# Patient Record
Sex: Female | Born: 1965 | Race: Black or African American | Hispanic: No | Marital: Married | Smoking: Never smoker
Health system: Southern US, Community
[De-identification: ages and names within clinical notes are randomized; demographics above are authoritative.]

## PROBLEM LIST (undated history)

## (undated) DIAGNOSIS — G4730 Sleep apnea, unspecified: Secondary | ICD-10-CM

## (undated) DIAGNOSIS — D649 Anemia, unspecified: Secondary | ICD-10-CM

## (undated) DIAGNOSIS — R9431 Abnormal electrocardiogram [ECG] [EKG]: Secondary | ICD-10-CM

## (undated) DIAGNOSIS — K219 Gastro-esophageal reflux disease without esophagitis: Secondary | ICD-10-CM

## (undated) DIAGNOSIS — R072 Precordial pain: Secondary | ICD-10-CM

## (undated) DIAGNOSIS — E785 Hyperlipidemia, unspecified: Secondary | ICD-10-CM

## (undated) DIAGNOSIS — I10 Essential (primary) hypertension: Secondary | ICD-10-CM

## (undated) DIAGNOSIS — R202 Paresthesia of skin: Secondary | ICD-10-CM

## (undated) DIAGNOSIS — J45909 Unspecified asthma, uncomplicated: Secondary | ICD-10-CM

## (undated) DIAGNOSIS — F419 Anxiety disorder, unspecified: Secondary | ICD-10-CM

## (undated) HISTORY — DX: Anemia, unspecified: D64.9

## (undated) HISTORY — DX: Paresthesia of skin: R20.2

## (undated) HISTORY — DX: Unspecified asthma, uncomplicated: J45.909

## (undated) HISTORY — DX: Essential (primary) hypertension: I10

## (undated) HISTORY — DX: Sleep apnea, unspecified: G47.30

## (undated) HISTORY — DX: Hyperlipidemia, unspecified: E78.5

## (undated) HISTORY — DX: Abnormal electrocardiogram (ECG) (EKG): R94.31

## (undated) HISTORY — DX: Precordial pain: R07.2

## (undated) HISTORY — PX: ABDOMINAL HYSTERECTOMY: SHX81

## (undated) HISTORY — PX: CHOLECYSTECTOMY: SHX55

## (undated) HISTORY — DX: Gastro-esophageal reflux disease without esophagitis: K21.9

## (undated) HISTORY — DX: Anxiety disorder, unspecified: F41.9

---

## 2008-02-06 ENCOUNTER — Emergency Department (HOSPITAL_COMMUNITY): Admission: EM | Admit: 2008-02-06 | Discharge: 2008-02-07 | Payer: Self-pay | Admitting: Emergency Medicine

## 2008-02-07 IMAGING — CR DG CHEST 2V
2 series · 2 of 2 positions shown · non-contrast
Comparison: None

CLINICAL DATA: Chest pain

CHEST - 2 VIEW

[w chest pa]
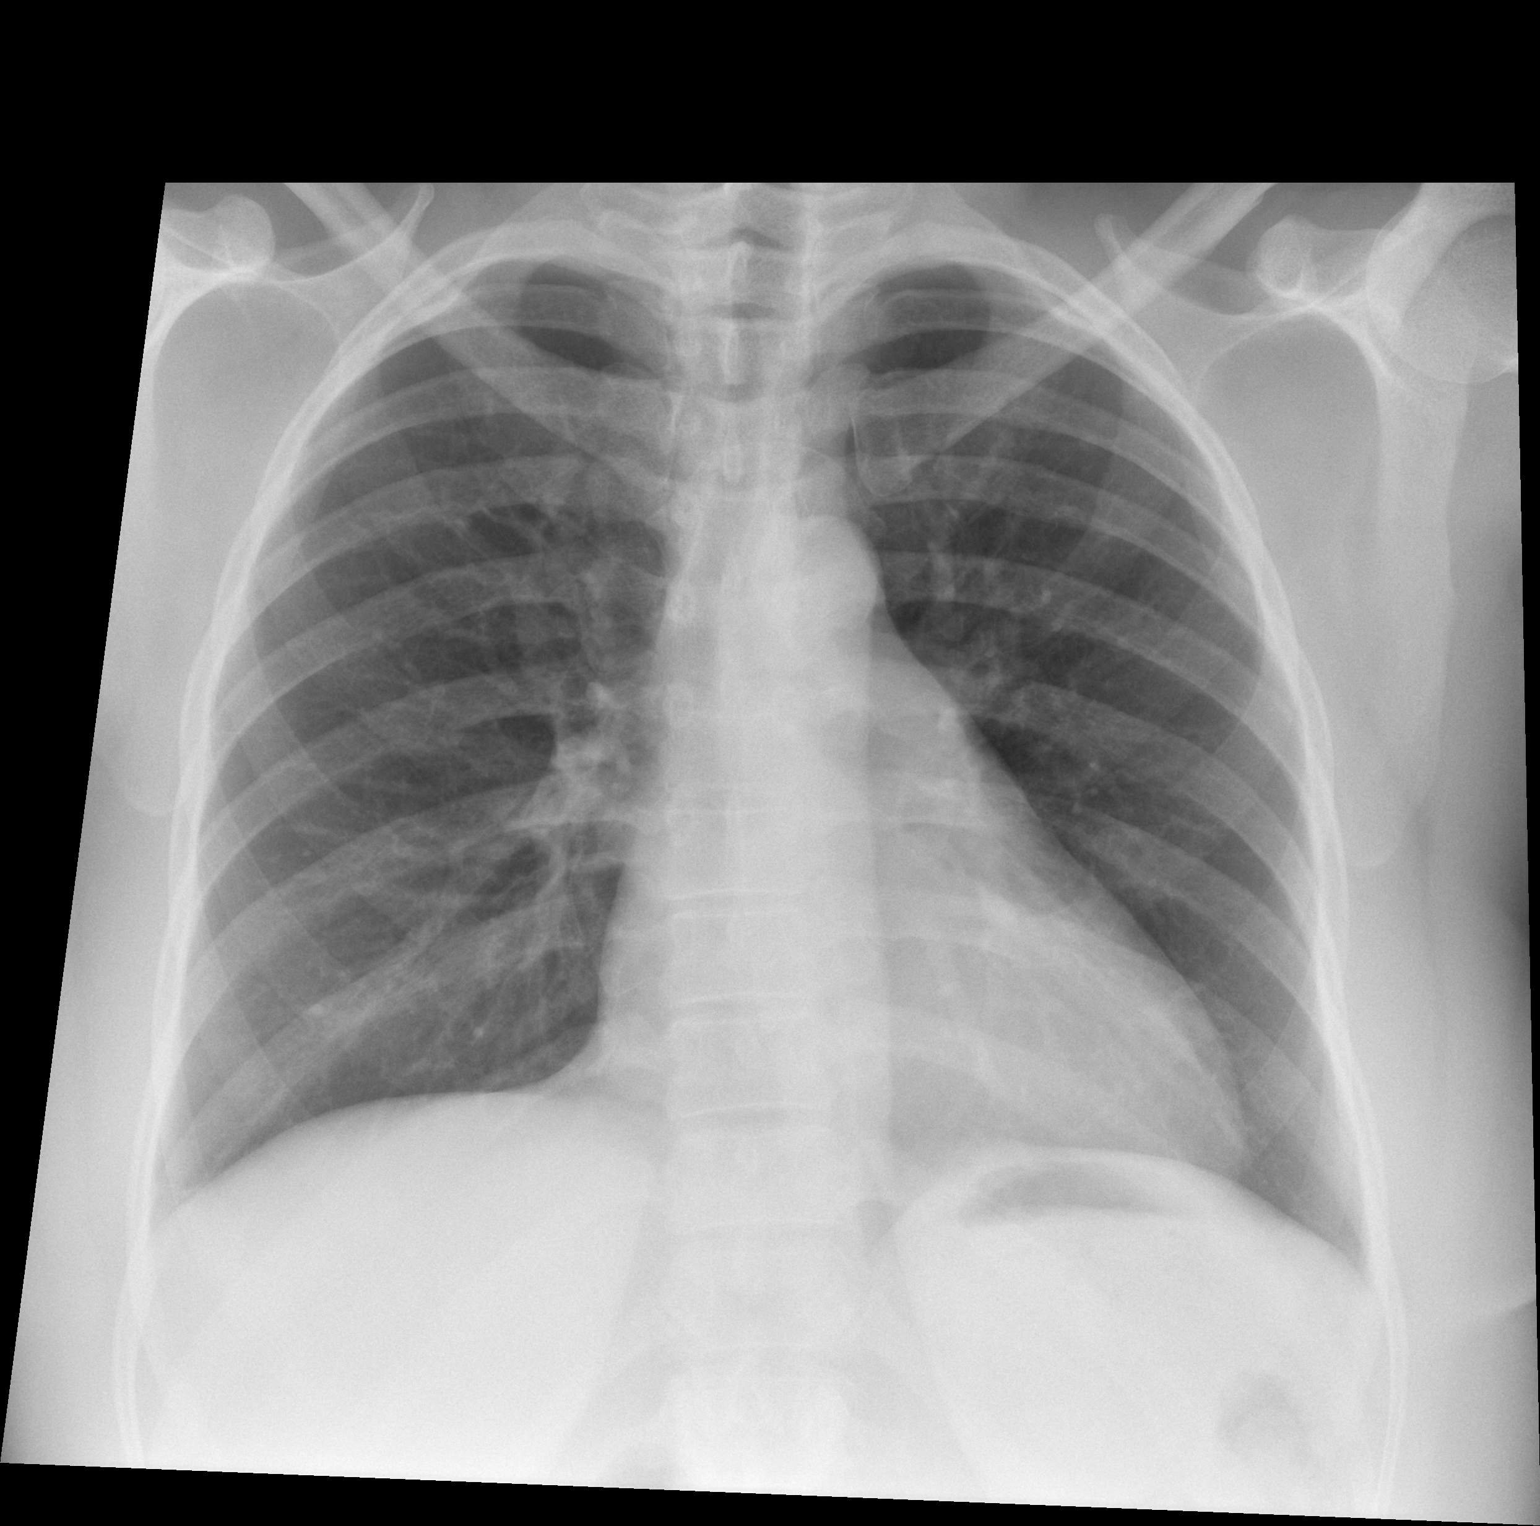

[w chest lat]
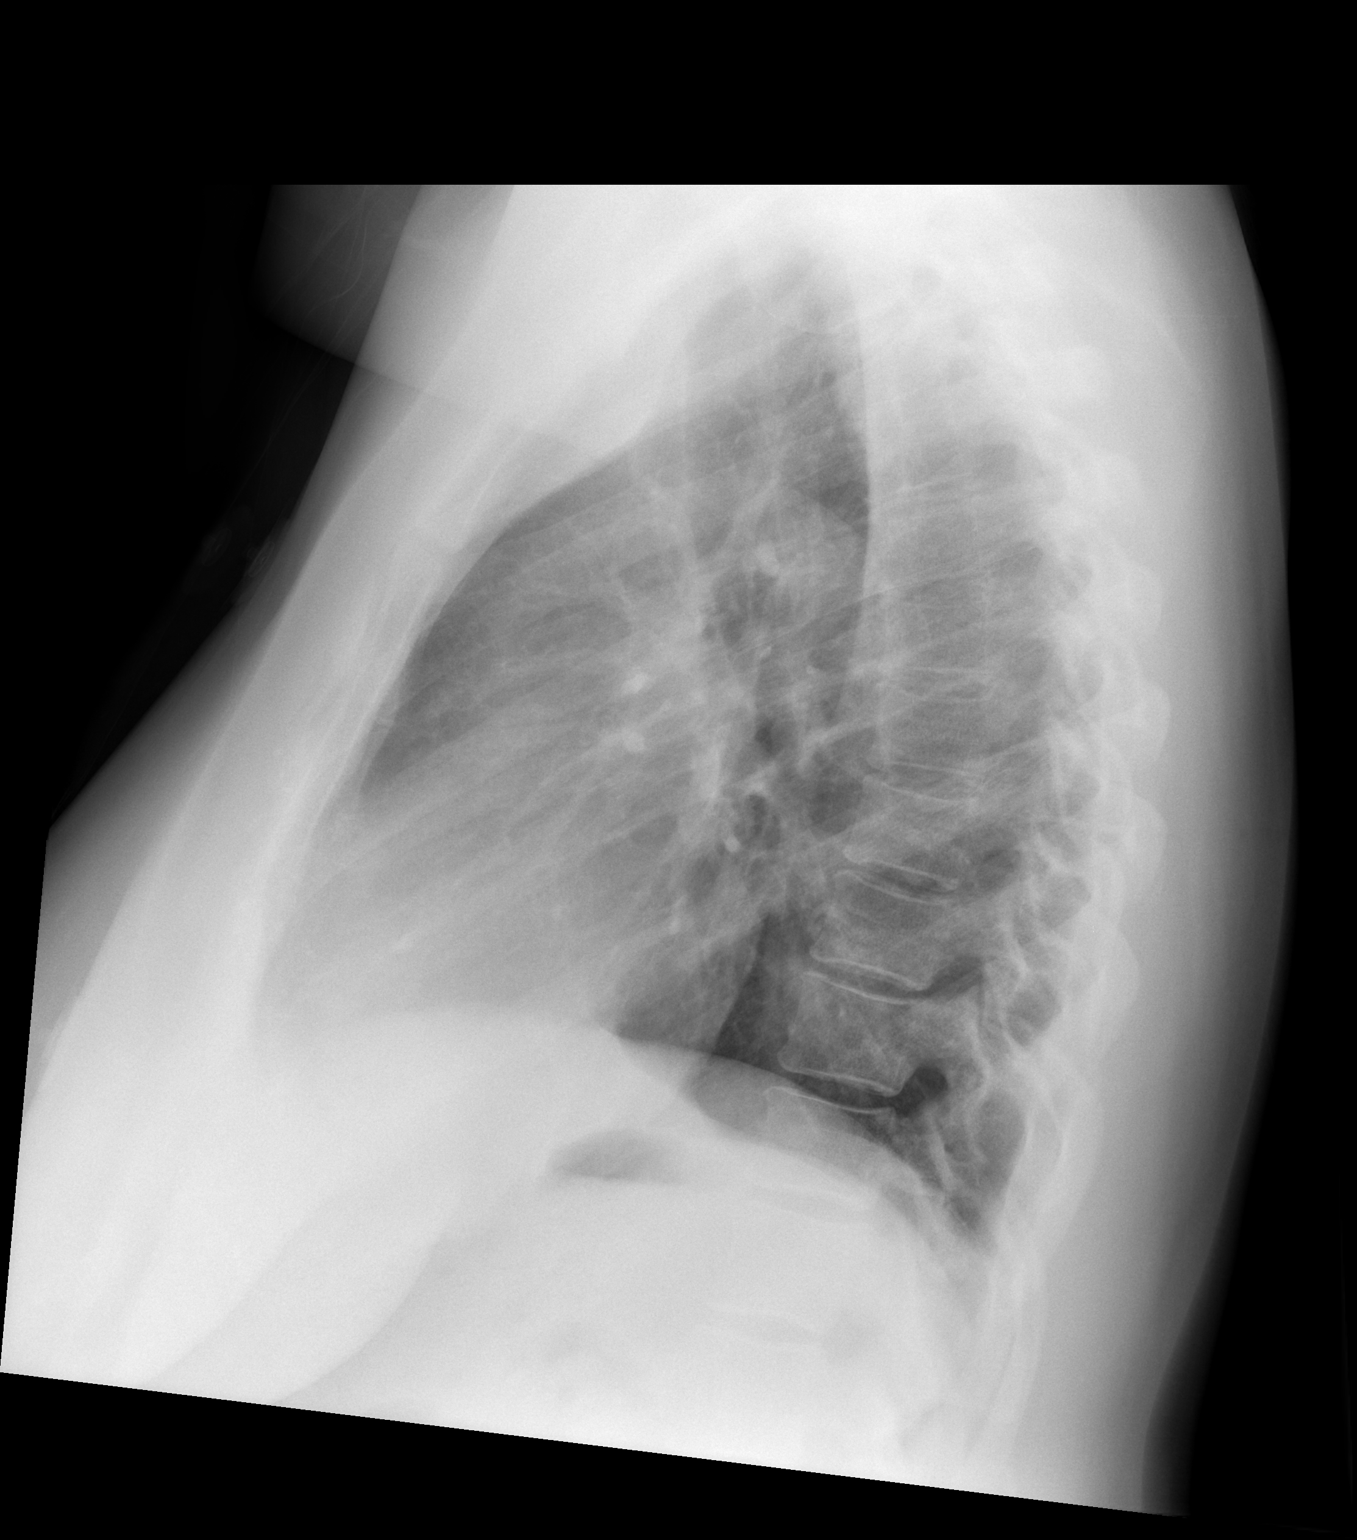

[2 of 2 positions shown; findings below may reference images not displayed]

FINDINGS: Upper normal heart size.
Normal mediastinal contours and pulmonary vascularity.
Lungs clear.
Bones unremarkable.
No pneumothorax.
IMPRESSION: No acute abnormalities.

## 2008-02-07 IMAGING — CT CT HEAD W/O CM
1 of 2 series · 13 of 30 positions shown, 17 images · non-contrast
Comparison: None

CLINICAL DATA: Chest pain, right facial heaviness and intermittent
numbness, tightness in head

CT HEAD WITHOUT CONTRAST
TECHNIQUE: Contiguous axial images were obtained from the base of
the skull through the vertex without contrast.

[Series 2: brain · axial · 0.47mm/px · z∈[-151,-30]mm · 13 of 28 slices shown, 17 images]
[im 2/28  brain]
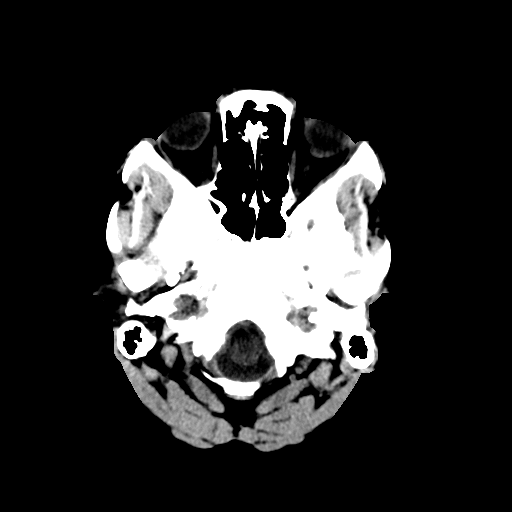
[im 2/28  bone]
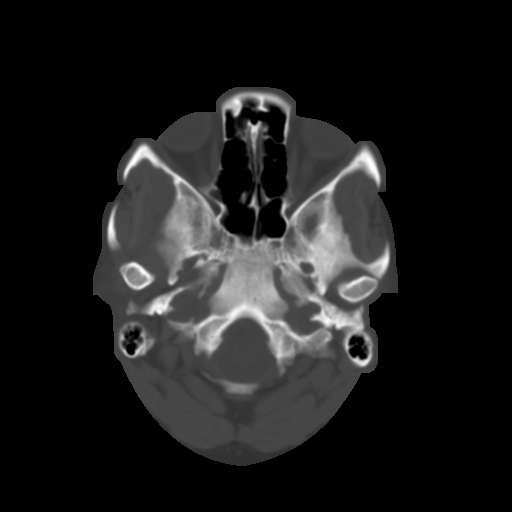
[im 4/28  brain]
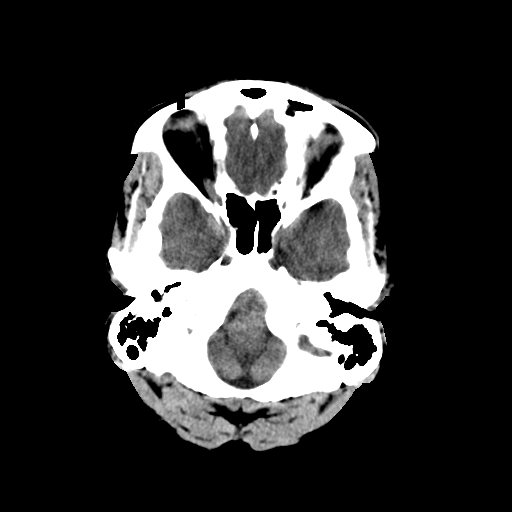
[im 6/28  brain]
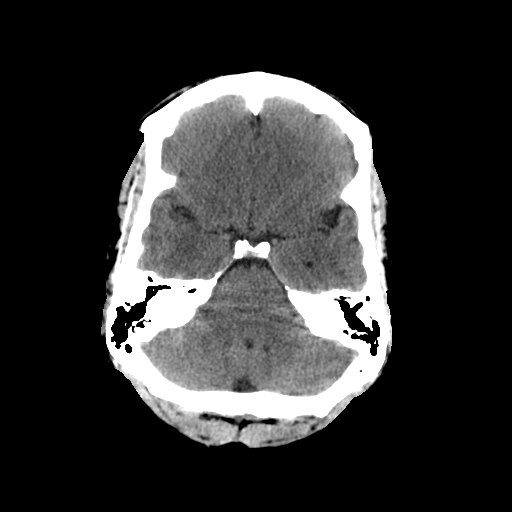
[im 8/28  brain]
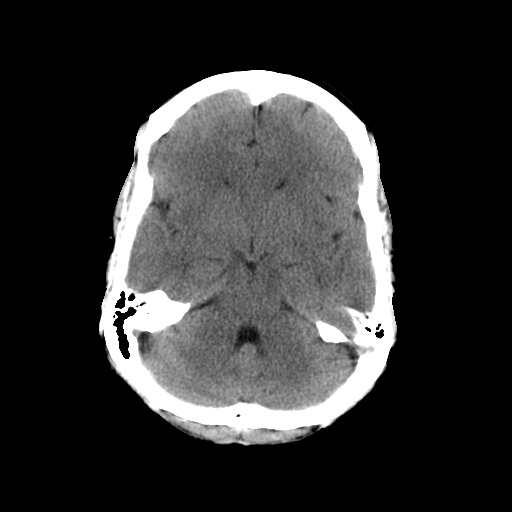
[im 10/28  brain]
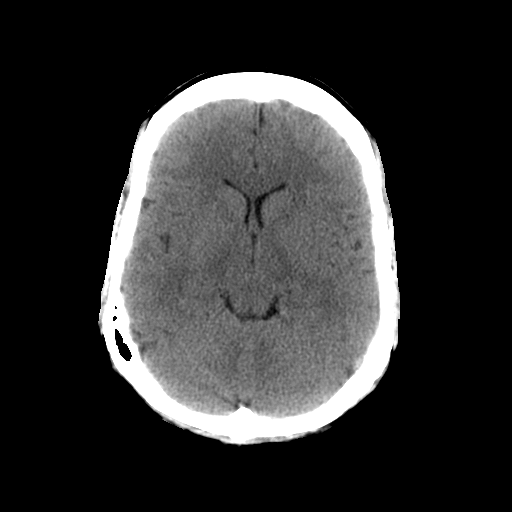
[im 10/28  bone]
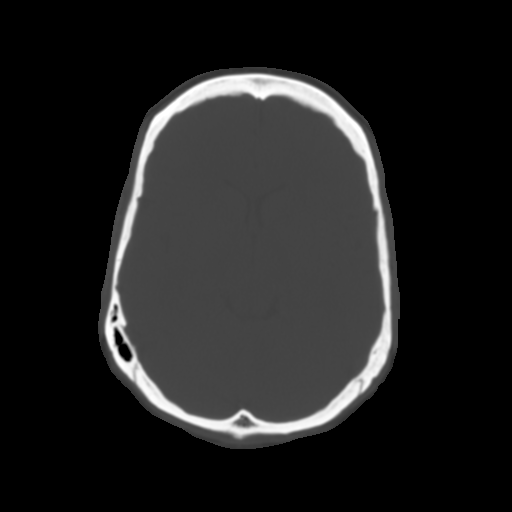
[im 12/28  brain]
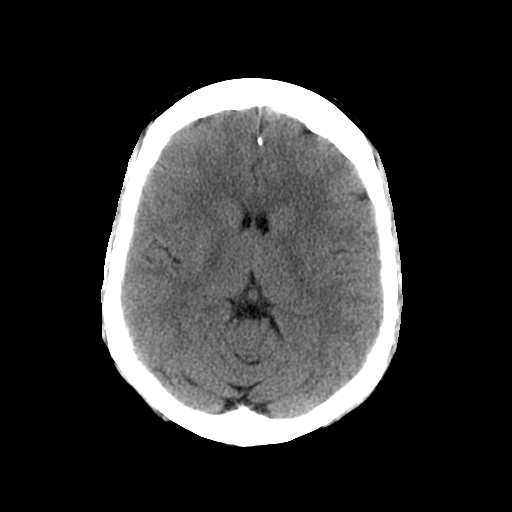
[im 14/28  brain]
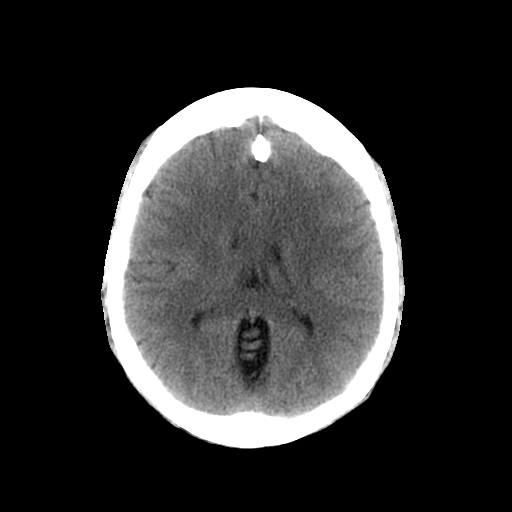
[im 16/28  brain]
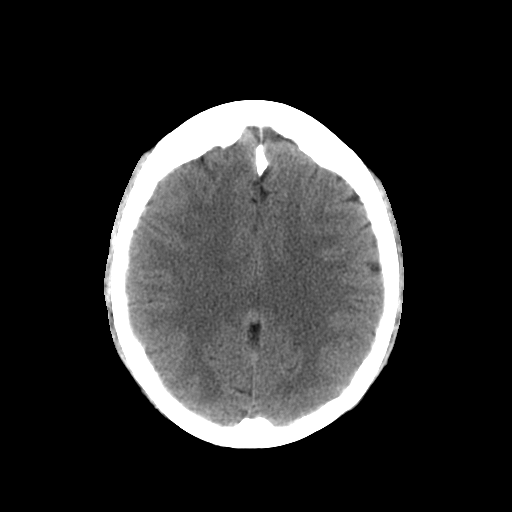
[im 18/28  brain]
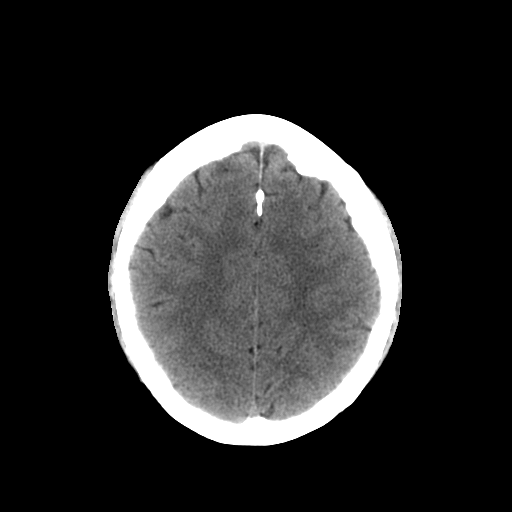
[im 18/28  bone]
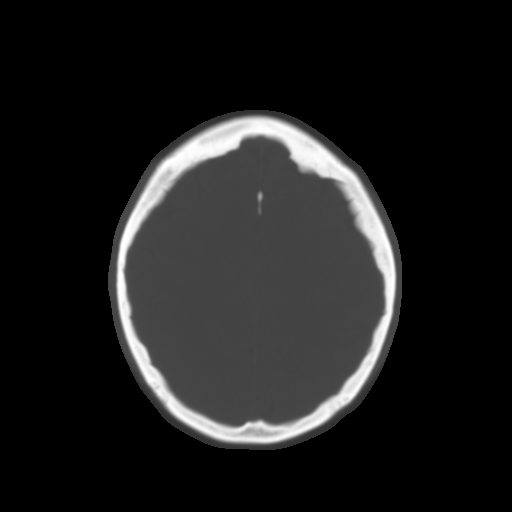
[im 20/28  brain]
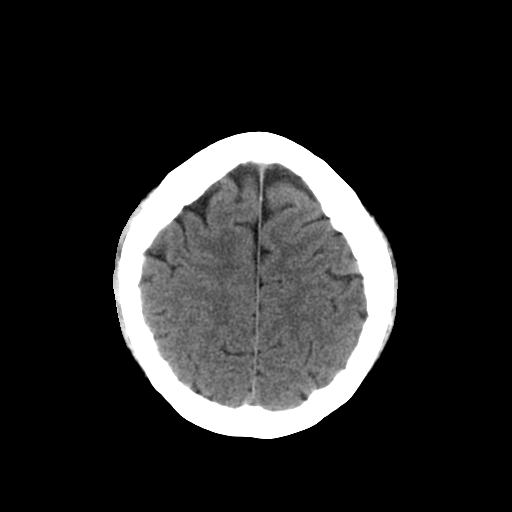
[im 22/28  brain]
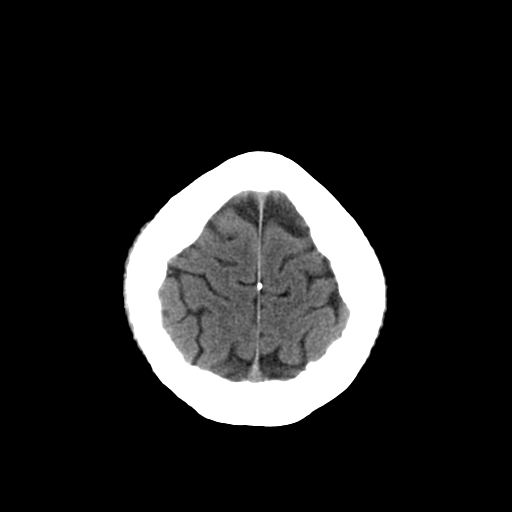
[im 24/28  brain]
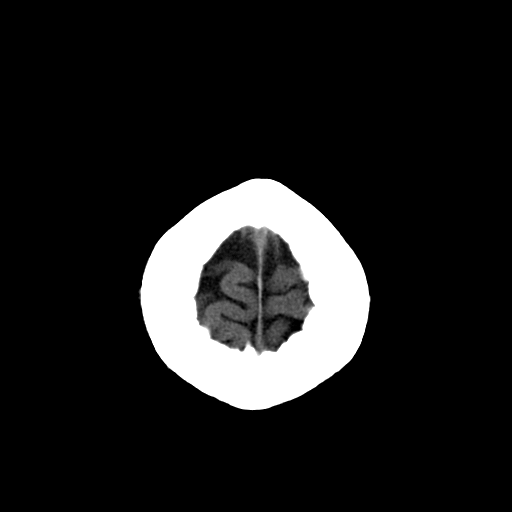
[im 26/28  brain]
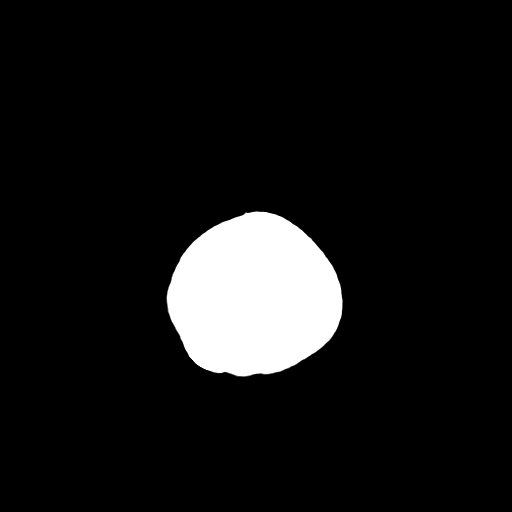
[im 26/28  bone]
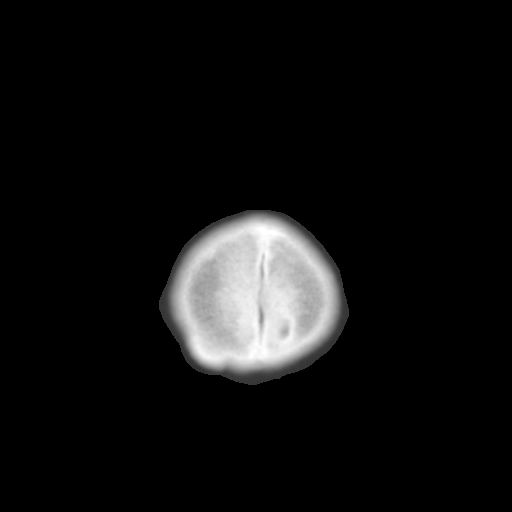

[13 of 30 positions shown; findings below may reference images not displayed]

FINDINGS: Normal ventricular morphology.
No midline shift or mass effect.
Normal appearance of brain parenchyma.
No intracranial hemorrhage, mass lesion, or acute infarct.
Visualized paranasal sinuses and mastoid air cells clear.
Bones unremarkable.
IMPRESSION: No acute intracranial abnormalities.

## 2008-02-23 ENCOUNTER — Emergency Department (HOSPITAL_COMMUNITY): Admission: EM | Admit: 2008-02-23 | Discharge: 2008-02-23 | Payer: Self-pay | Admitting: Emergency Medicine
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
## 2008-07-09 ENCOUNTER — Ambulatory Visit (HOSPITAL_COMMUNITY): Admission: RE | Admit: 2008-07-09 | Discharge: 2008-07-09 | Payer: Self-pay | Admitting: Nurse Practitioner

## 2008-07-09 IMAGING — US US PELVIS COMPLETE MODIFY
1 series · 14 of 25 positions shown · non-contrast
Comparison: None

CLINICAL DATA: Mass felt on physical exam, prior hysterectomy

TRANSABDOMINAL AND TRANSVAGINAL ULTRASOUND OF PELVIS
TECHNIQUE: Both transabdominal and transvaginal ultrasound
examinations of the pelvis were performed including evaluation of
the uterus, ovaries, adnexal regions, and pelvic cul-de-sac.

[Series 1: us pelvis complete modify · 0.28mm/px · 14 of 37 slices shown]
[im 1/37]
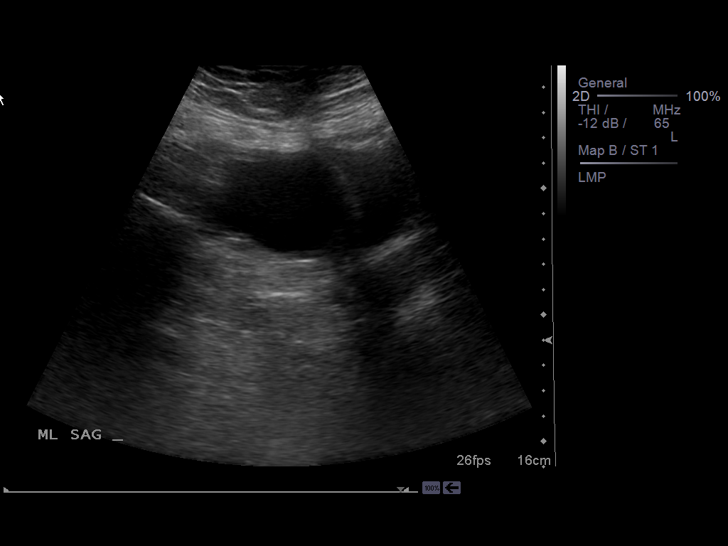
[im 4/37]
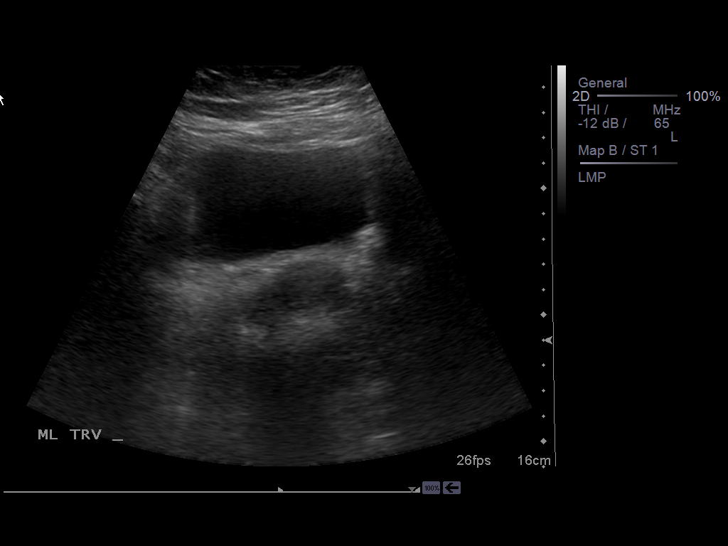
[im 7/37]
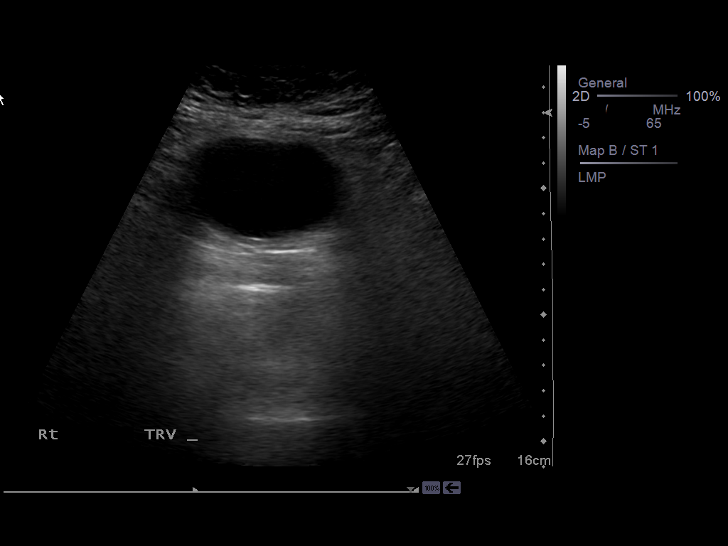
[im 10/37]
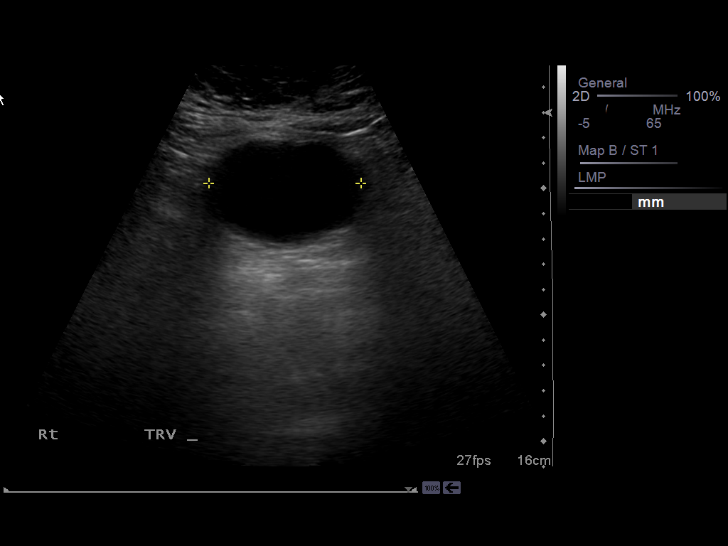
[im 13/37]
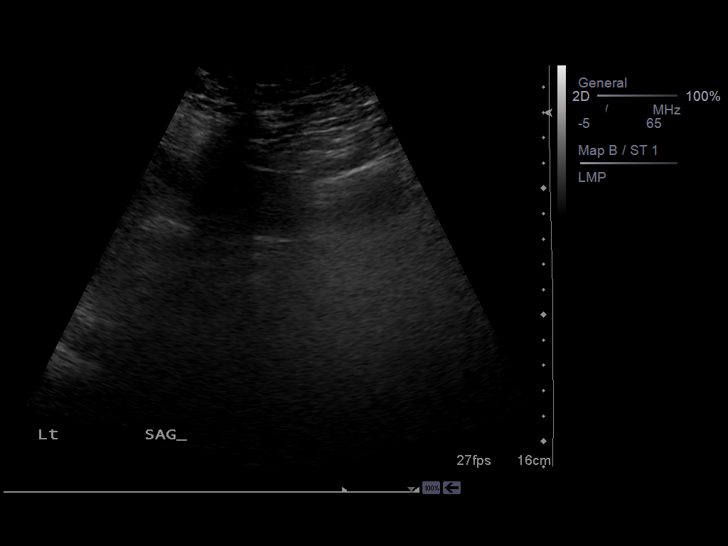
[im 14/37]
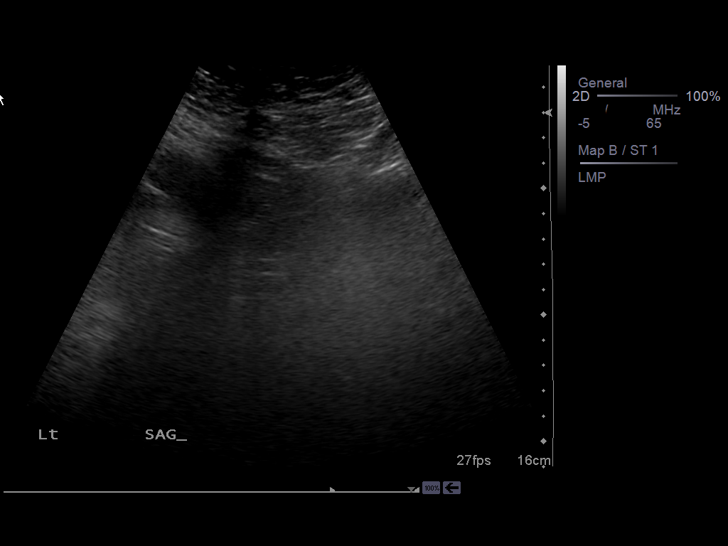
[im 17/37]
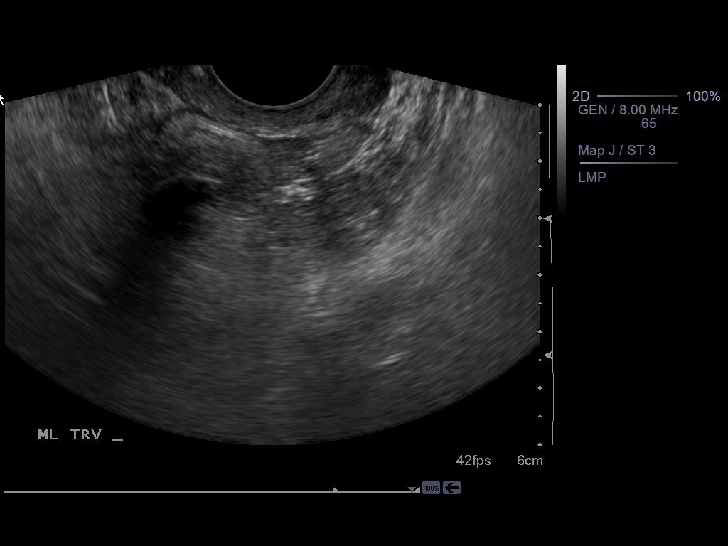
[im 20/37]
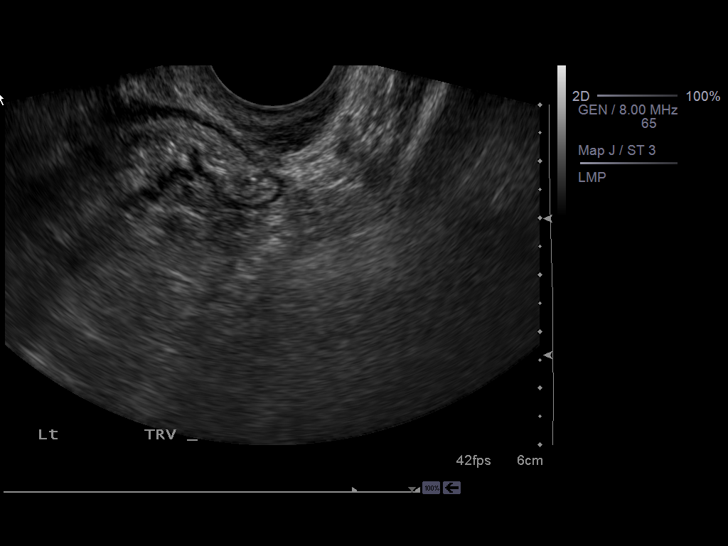
[im 23/37]
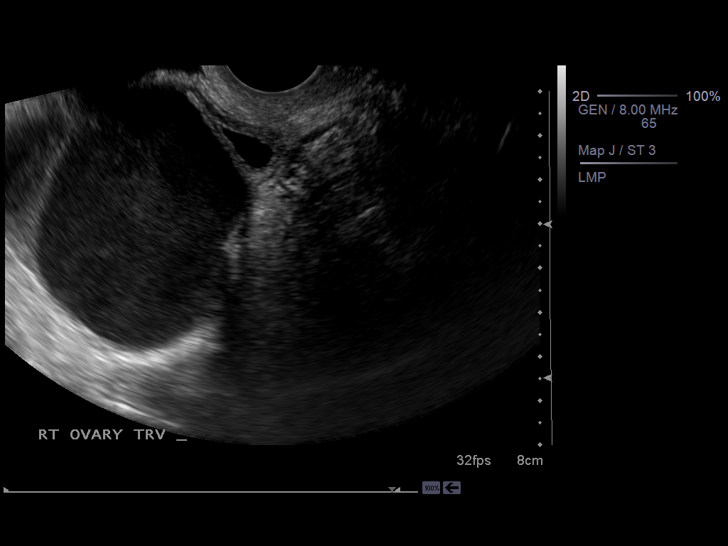
[im 25/37]
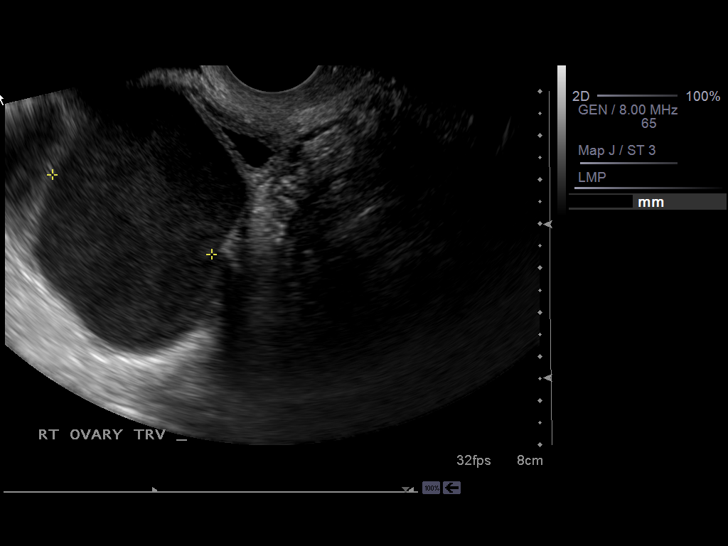
[im 28/37]
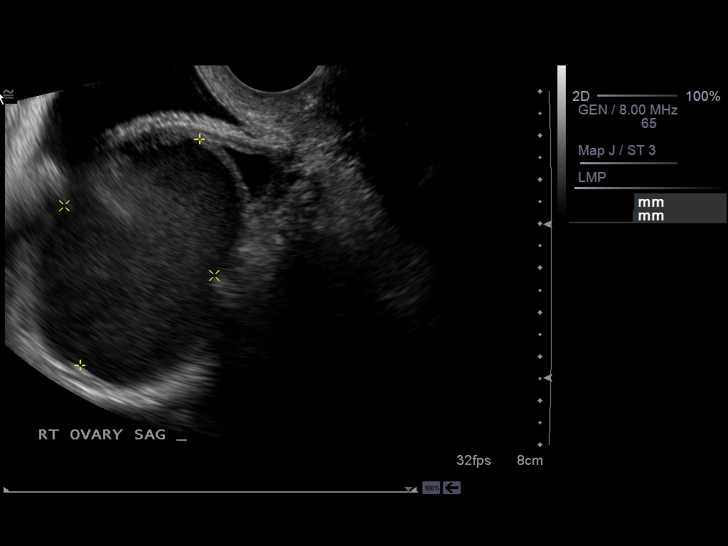
[im 31/37]
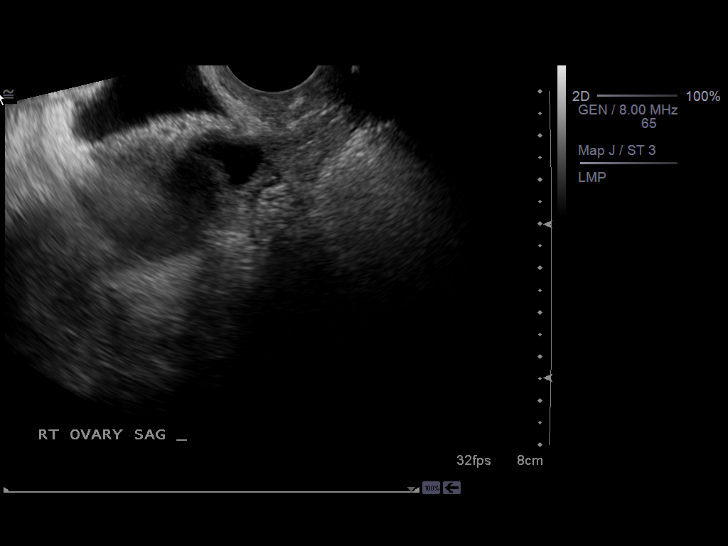
[im 34/37]
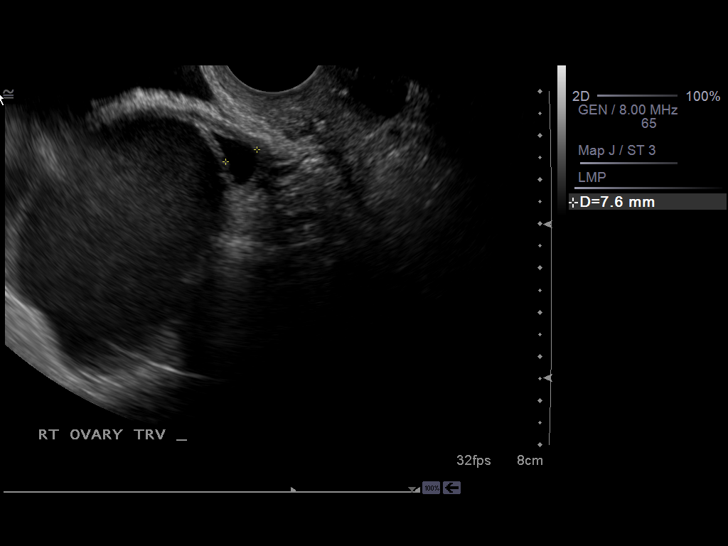
[im 37/37]
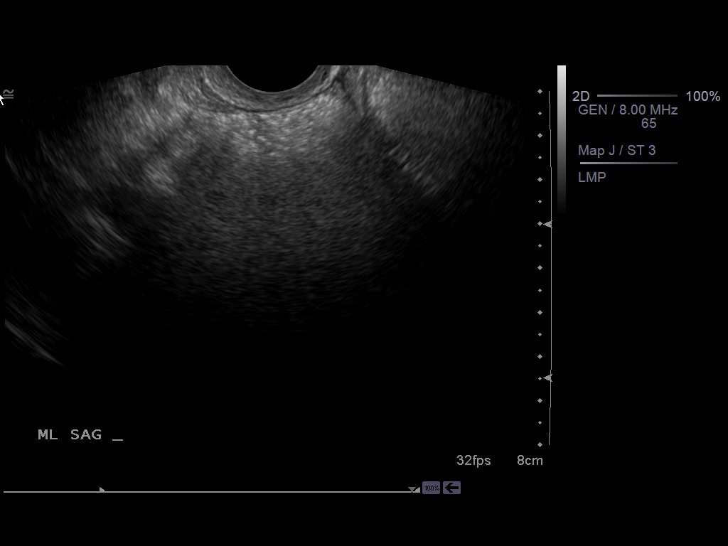

[14 of 25 positions shown; findings below may reference images not displayed]

FINDINGS: Uterus:  Prior hysterectomy

Endometrium:  Not applicable

Right Ovary:  The right ovary measures 5.0 x 6.3 x 5.0 cm.  There
is a complex cyst on the right of 5.8 x 3.7 x 4.0 cm with internal
echoes.  This could be hemorrhagic but also could represent
endometrioma.  Follow-up ultrasound or MRI is recommended. A
smaller adjacent cyst or free fluid is noted with this collection
measuring 1.2 x 0.9 x 0.8 cm.

Left Ovary:  Not visualized

Other Findings:  No free fluid
IMPRESSION: There is a primarily cystic right adnexal mass with internal echoes
of 5.8 x 3.7 x 4.0 cm.  Primary considerations are that of
hemorrhagic cyst versus endometrioma.  Recommend follow-up
ultrasound or MRI for more sensitive assessment.

## 2008-09-04 ENCOUNTER — Emergency Department (HOSPITAL_COMMUNITY): Admission: EM | Admit: 2008-09-04 | Discharge: 2008-09-04 | Payer: Self-pay | Admitting: Emergency Medicine

## 2008-09-04 IMAGING — CR DG CHEST 2V
2 series · 2 of 2 positions shown · non-contrast
Comparison: [DATE]

CLINICAL DATA: Chest pain.  Facial numbness.

CHEST - 2 VIEW

[view not recorded (1 of 2)]
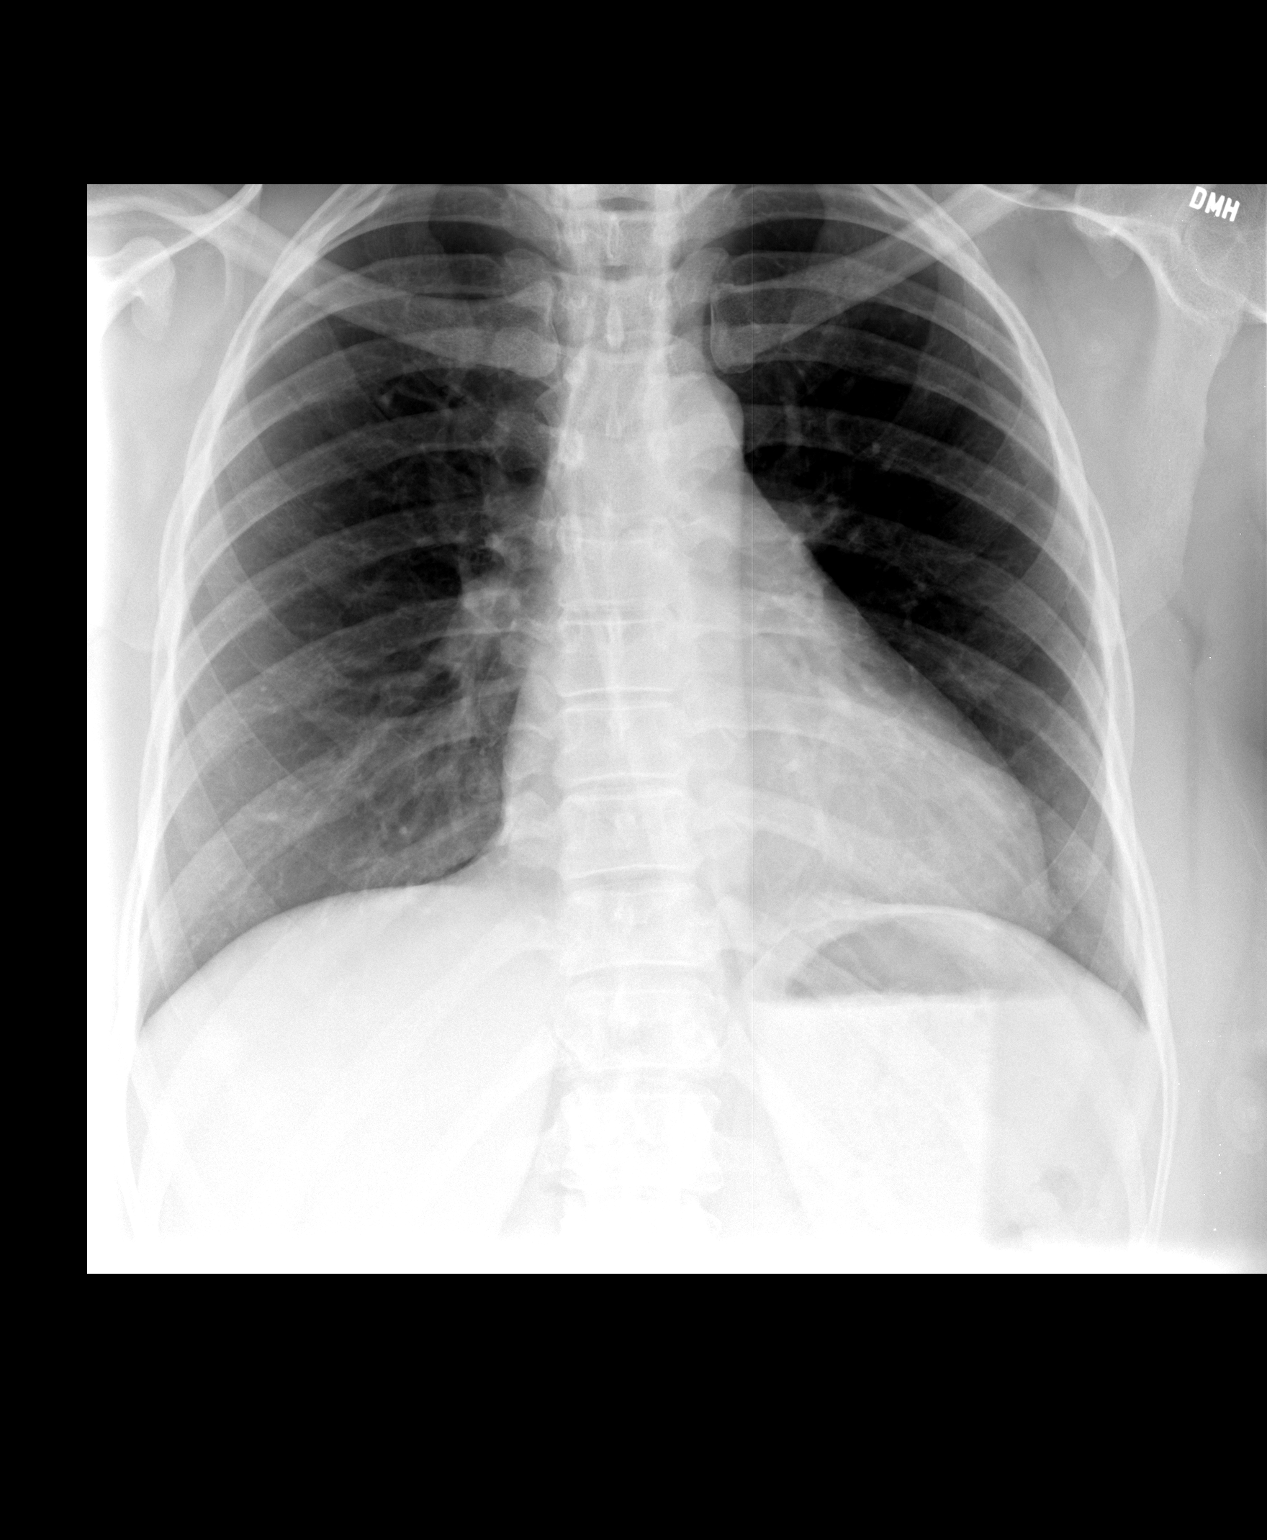

[view not recorded (2 of 2)]
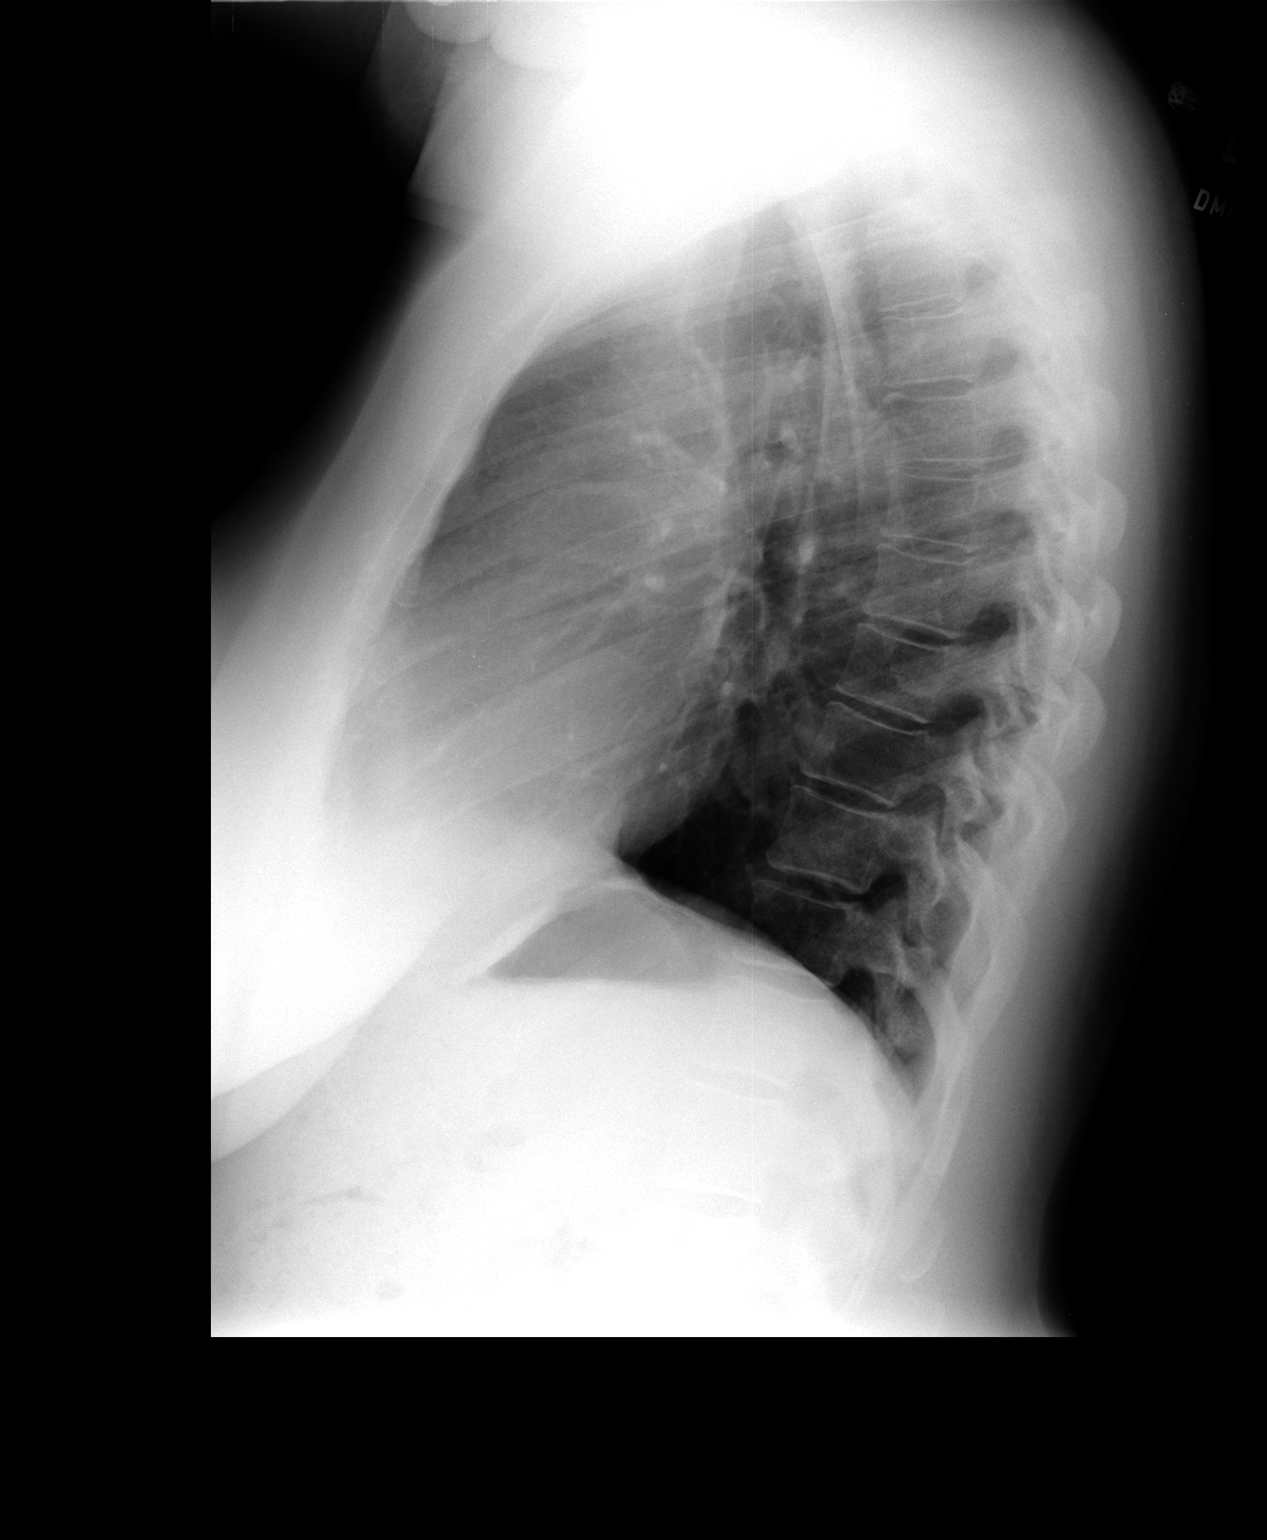

[2 of 2 positions shown; findings below may reference images not displayed]

FINDINGS: Mild cardiomegaly is present.  The lungs appear clear.
No edema or pleural effusion identified.
IMPRESSION: 1.  Mild cardiomegaly.  Otherwise negative.

## 2008-12-30 ENCOUNTER — Emergency Department (HOSPITAL_COMMUNITY): Admission: EM | Admit: 2008-12-30 | Discharge: 2008-12-30 | Payer: Self-pay | Admitting: Emergency Medicine

## 2008-12-30 IMAGING — CR DG CHEST 2V
2 series · 2 of 2 positions shown · non-contrast
Comparison: [DATE]

CLINICAL DATA: Chest pain and arm numbness.  Cough.

CHEST - 2 VIEW

[w chest pa]
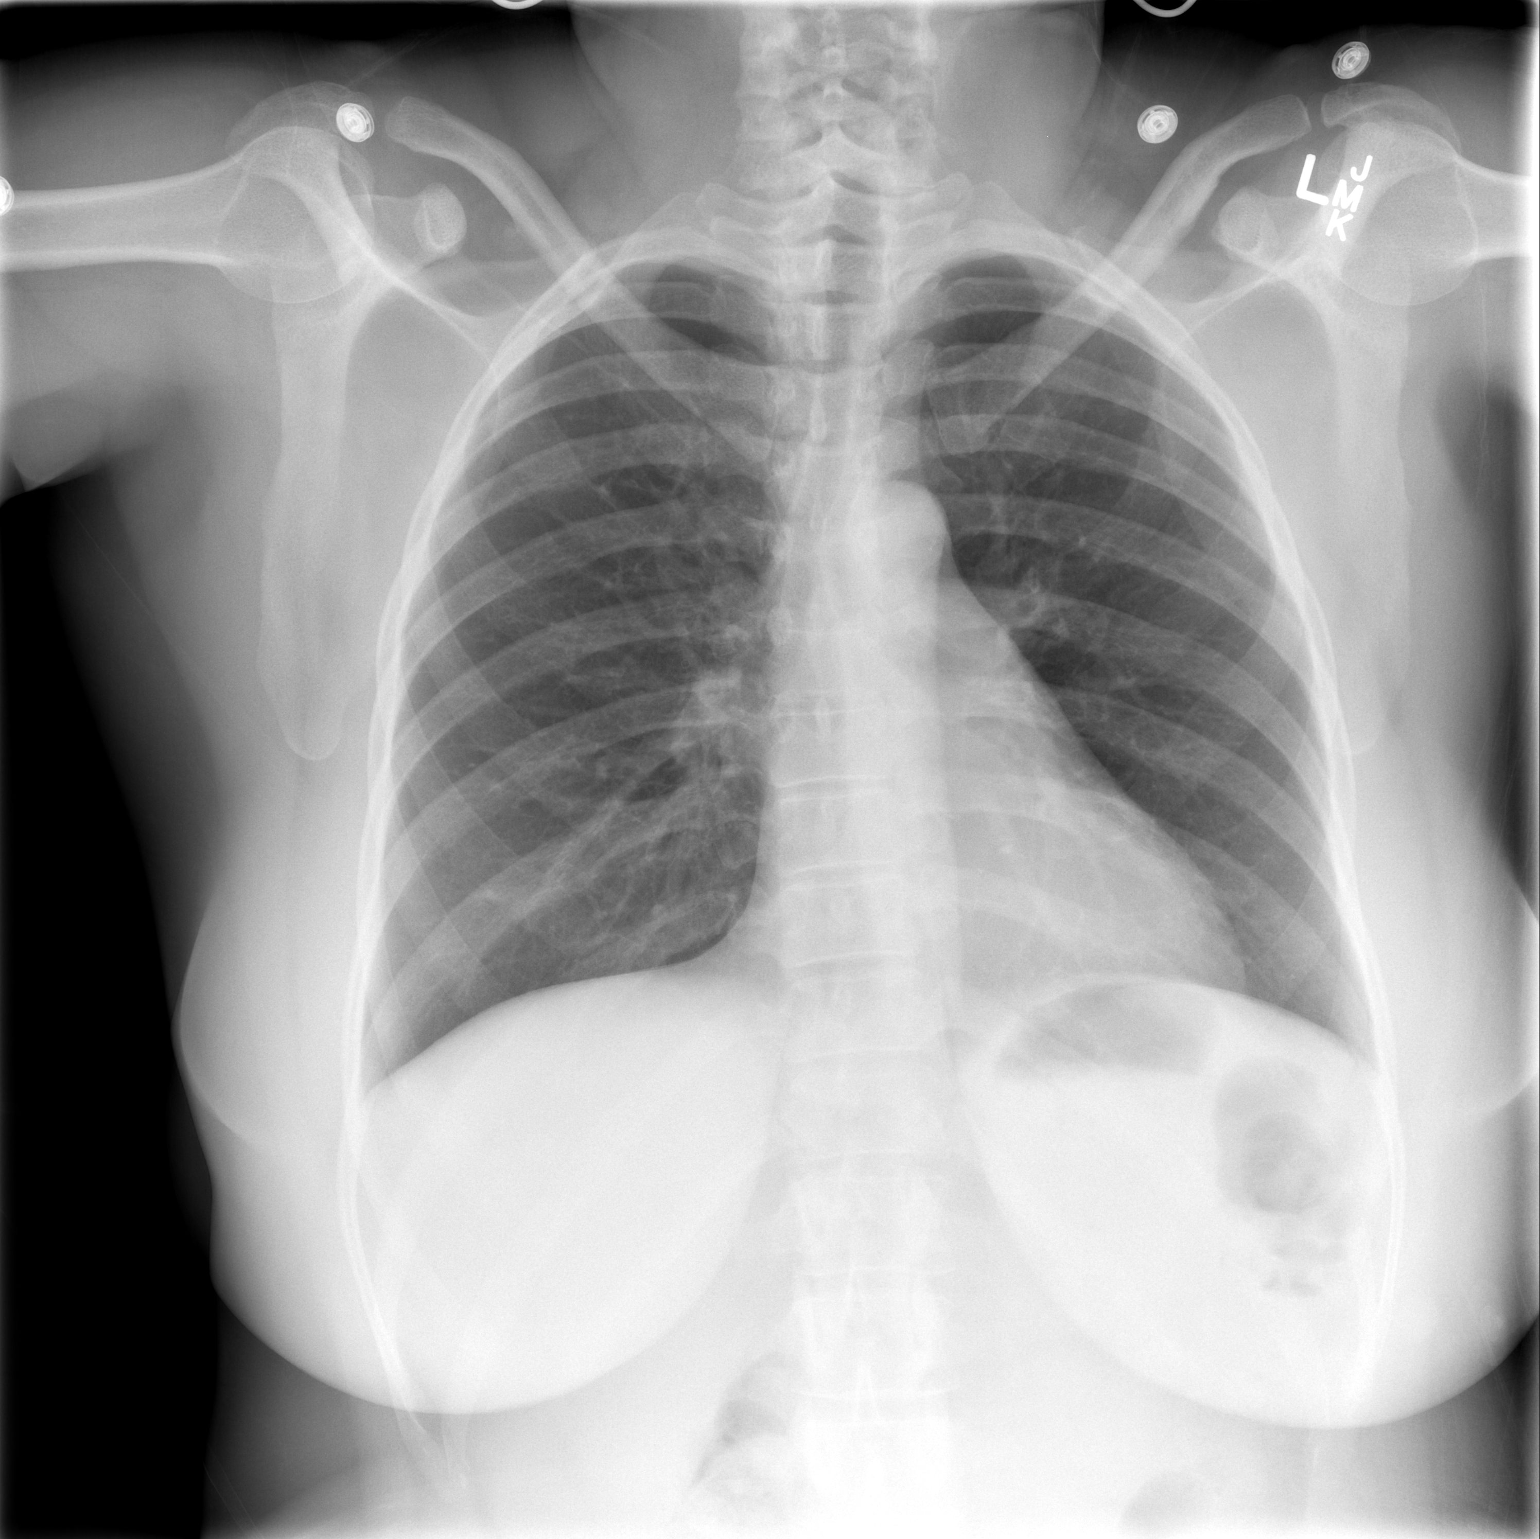

[w chest lat]
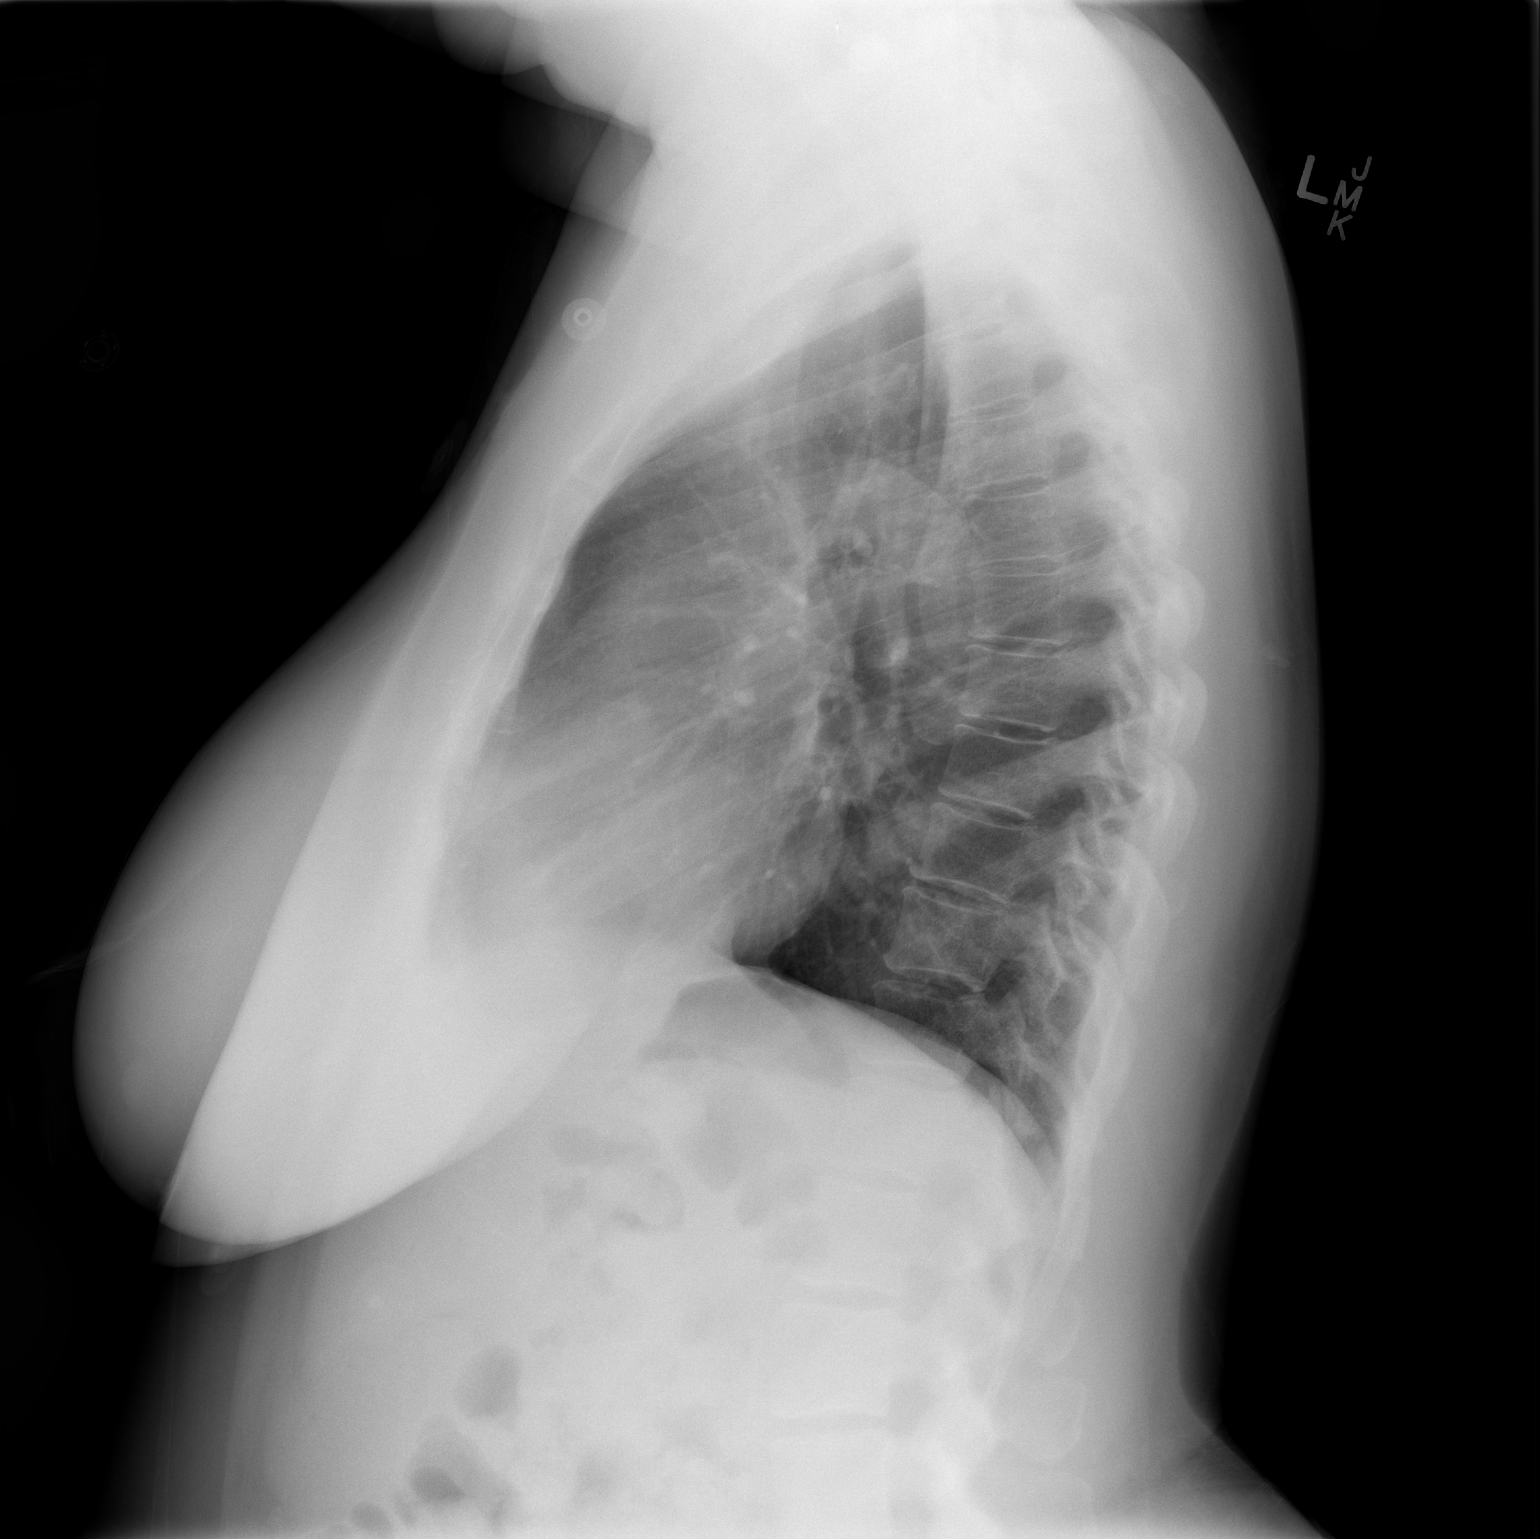

[2 of 2 positions shown; findings below may reference images not displayed]

FINDINGS: Cardiac and mediastinal contours appear normal.

The lungs appear clear.

No pleural effusion is identified.
IMPRESSION: 1.  No acute findings.

## 2009-02-08 ENCOUNTER — Emergency Department (HOSPITAL_COMMUNITY): Admission: EM | Admit: 2009-02-08 | Discharge: 2009-02-08 | Payer: Self-pay | Admitting: Emergency Medicine

## 2009-05-09 ENCOUNTER — Emergency Department (HOSPITAL_COMMUNITY): Admission: EM | Admit: 2009-05-09 | Discharge: 2009-05-10 | Payer: Self-pay | Admitting: Emergency Medicine

## 2009-05-09 IMAGING — CR DG CHEST 2V
2 series · 2 of 2 positions shown · non-contrast
Comparison: Chest radiograph performed [DATE]

CLINICAL DATA: Chest pain and headache.

CHEST - 2 VIEW

[w chest pa]
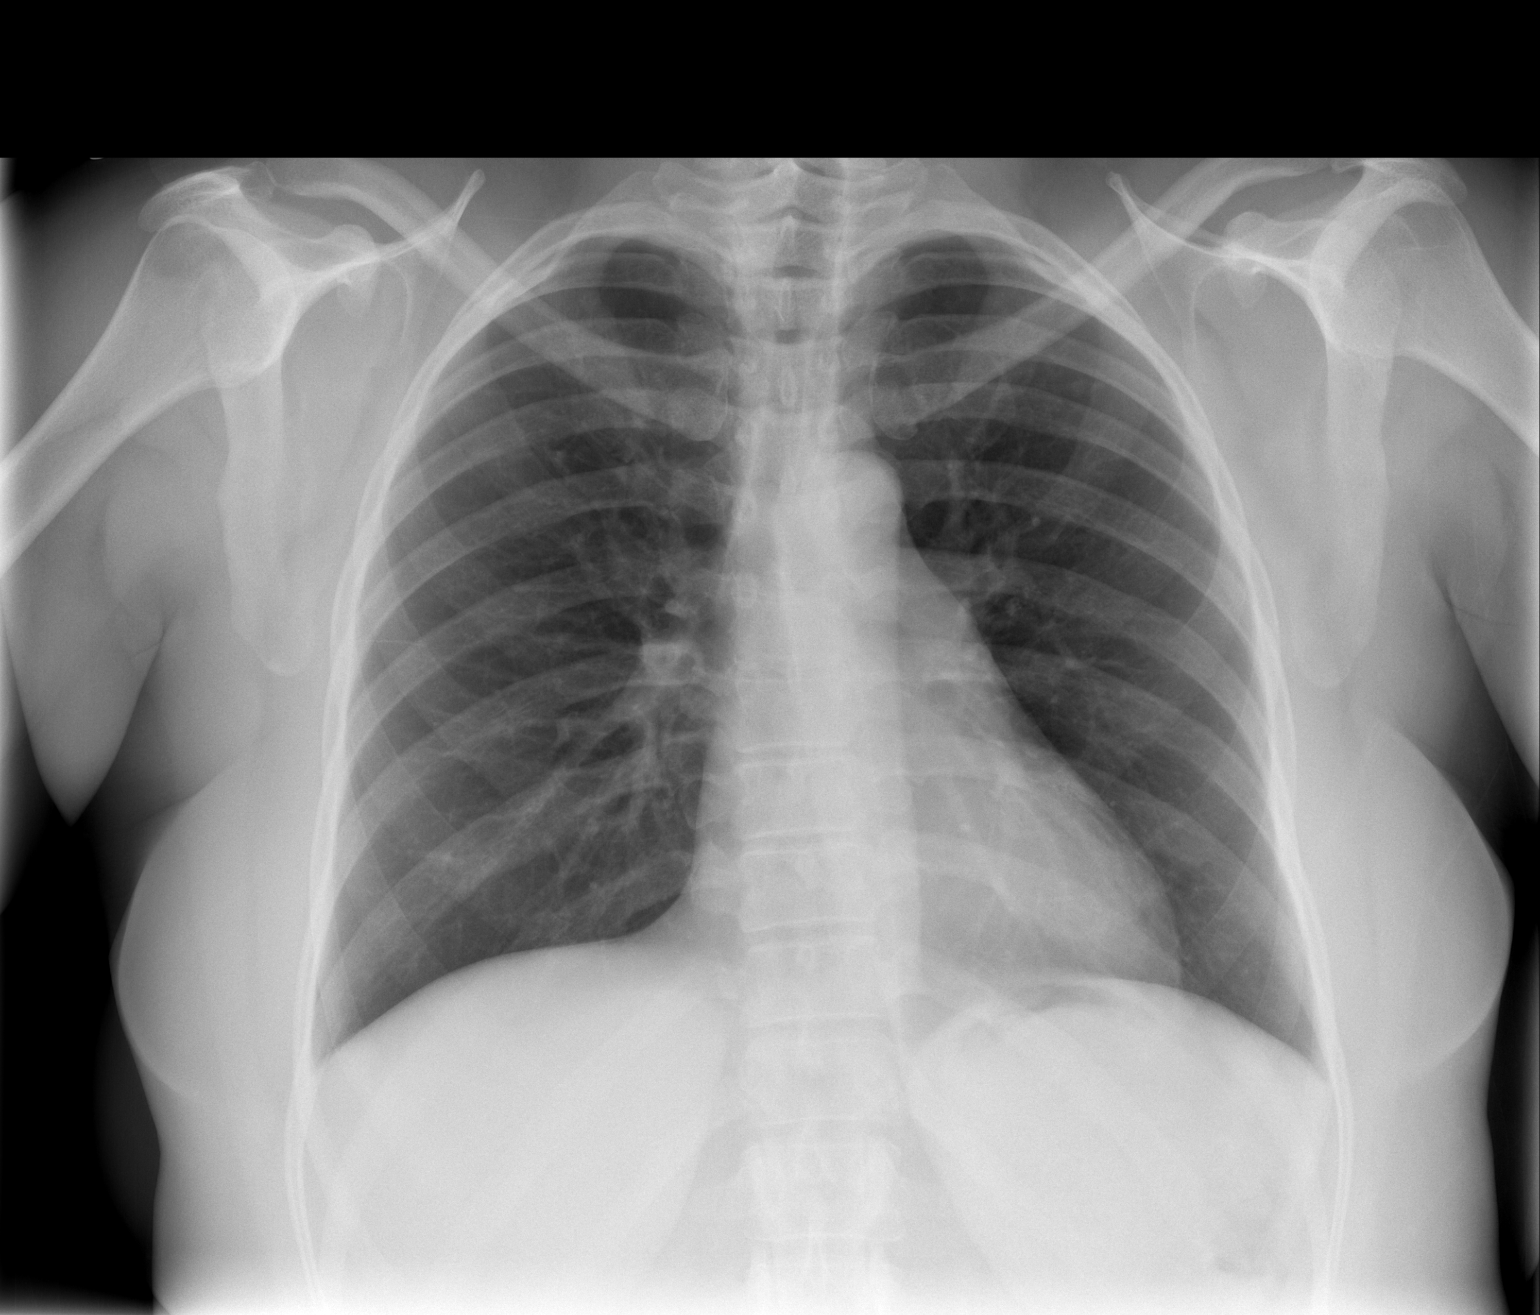

[w chest lat]
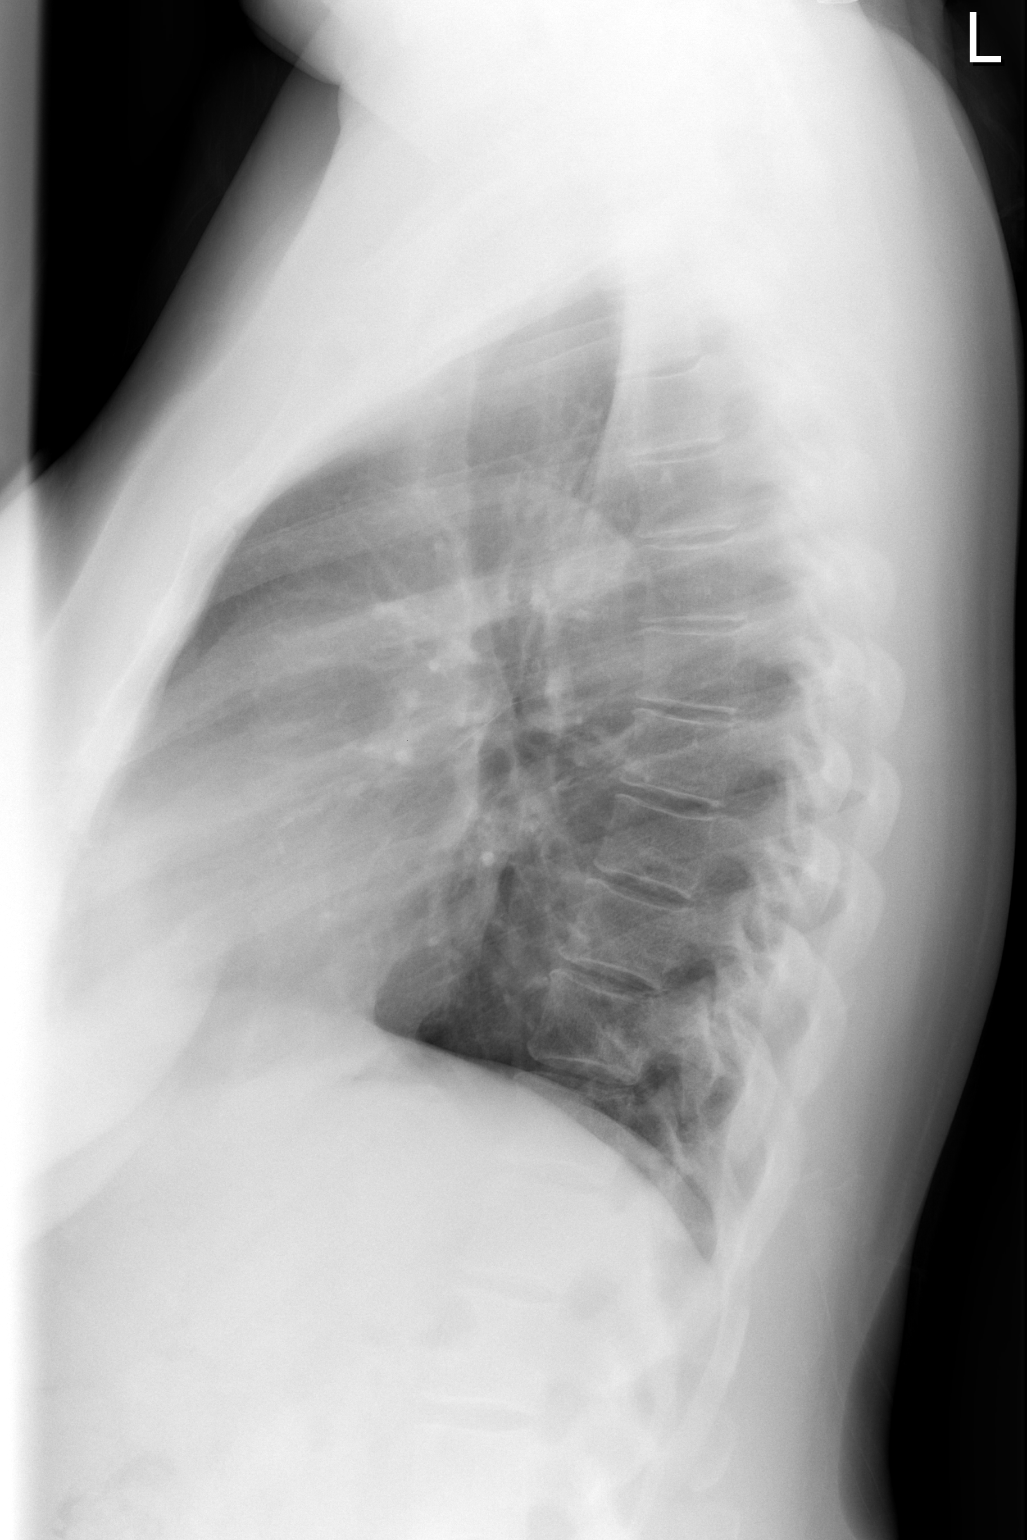

[2 of 2 positions shown; findings below may reference images not displayed]

FINDINGS: The lungs are well-aerated and clear.  There is no
evidence of focal opacification, pleural effusion or pneumothorax.

The heart is normal in size; the mediastinal contour is within
normal limits.  No acute osseous abnormalities are seen.
IMPRESSION: No acute cardiopulmonary process seen.

## 2009-05-12 ENCOUNTER — Inpatient Hospital Stay (HOSPITAL_COMMUNITY): Admission: EM | Admit: 2009-05-12 | Discharge: 2009-05-14 | Payer: Self-pay | Admitting: Emergency Medicine

## 2009-05-12 ENCOUNTER — Ambulatory Visit: Payer: Self-pay | Admitting: Cardiovascular Disease

## 2009-05-12 IMAGING — CR DG CHEST 2V
2 series · 2 of 2 positions shown · non-contrast
Comparison: [DATE]

CLINICAL DATA: Chest pain.

CHEST - 2 VIEW

[w chest pa]
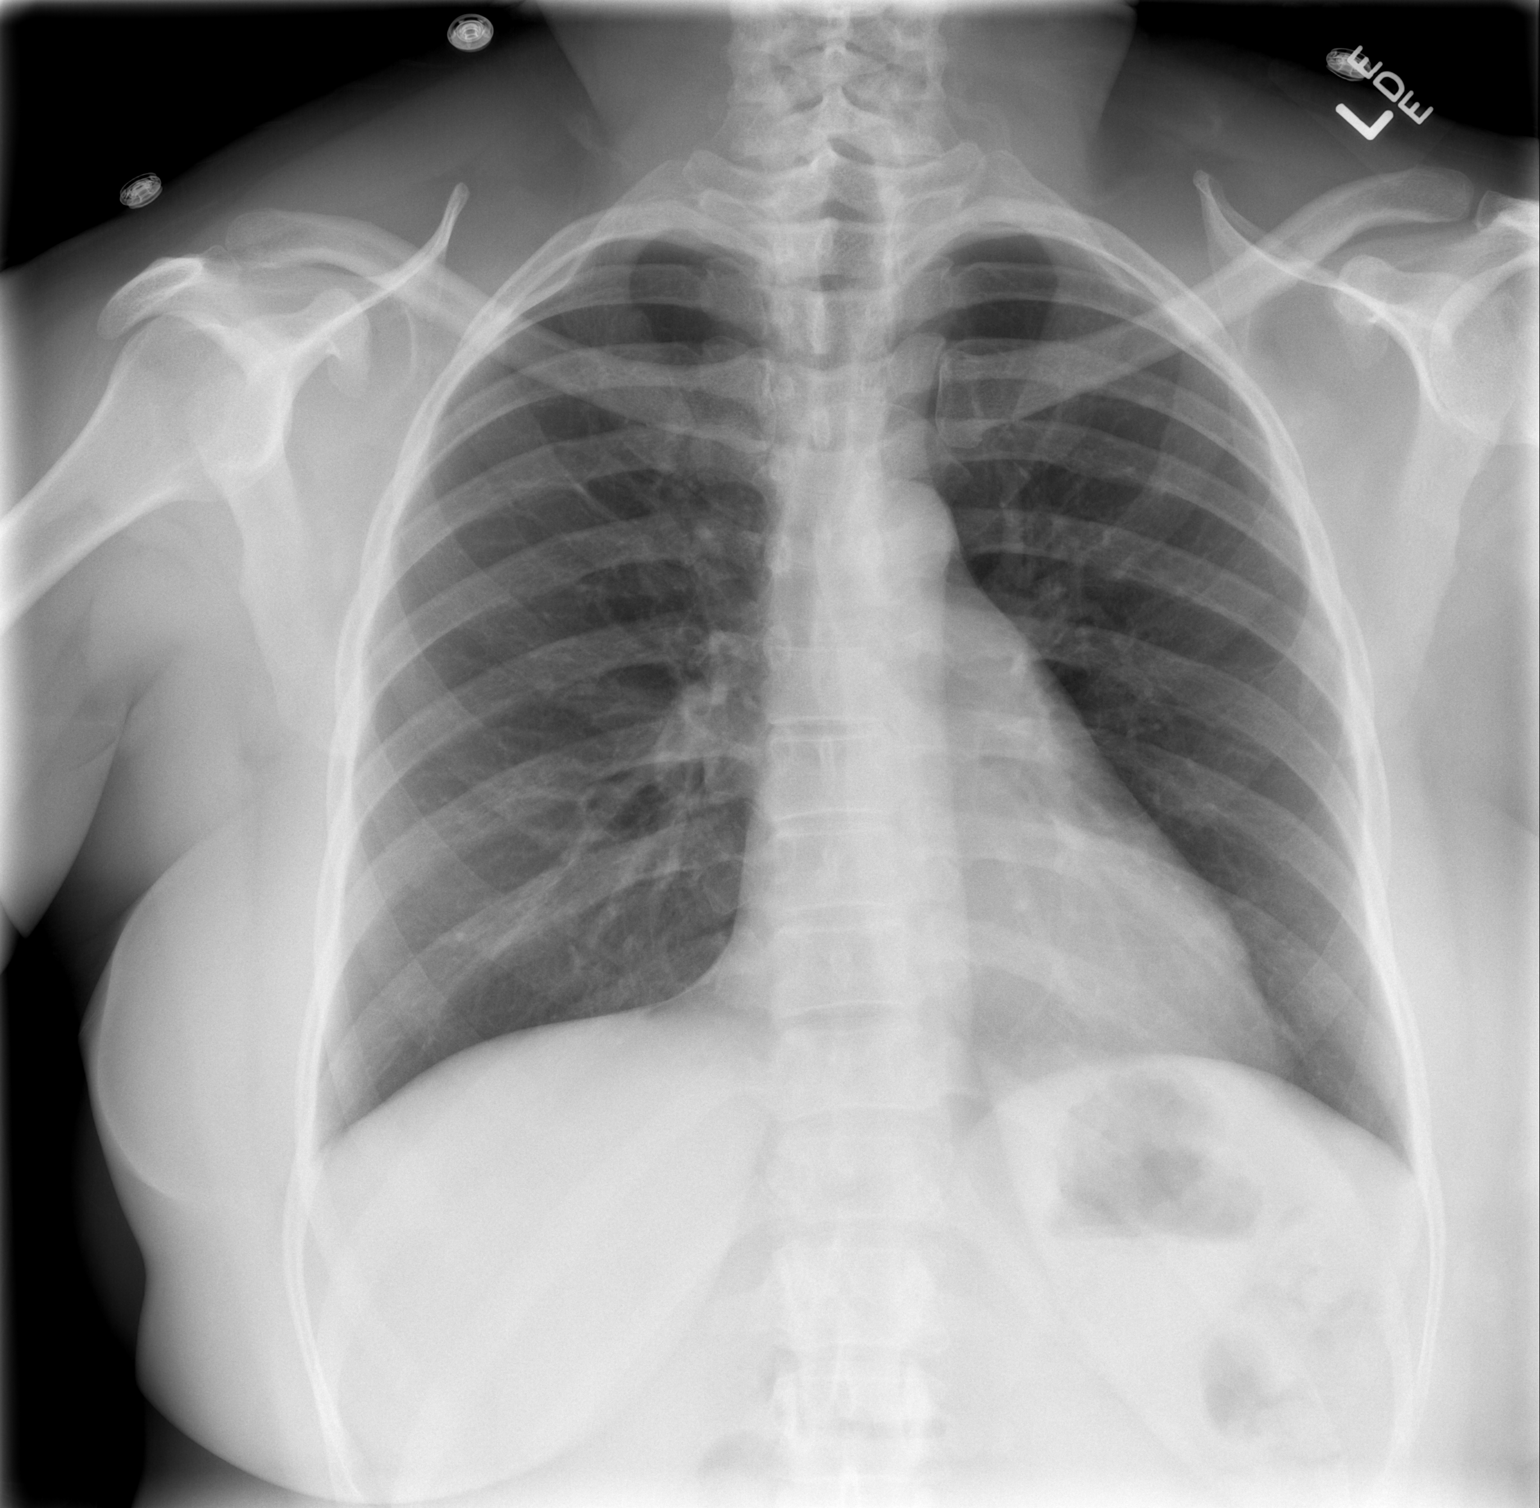

[w chest lat]
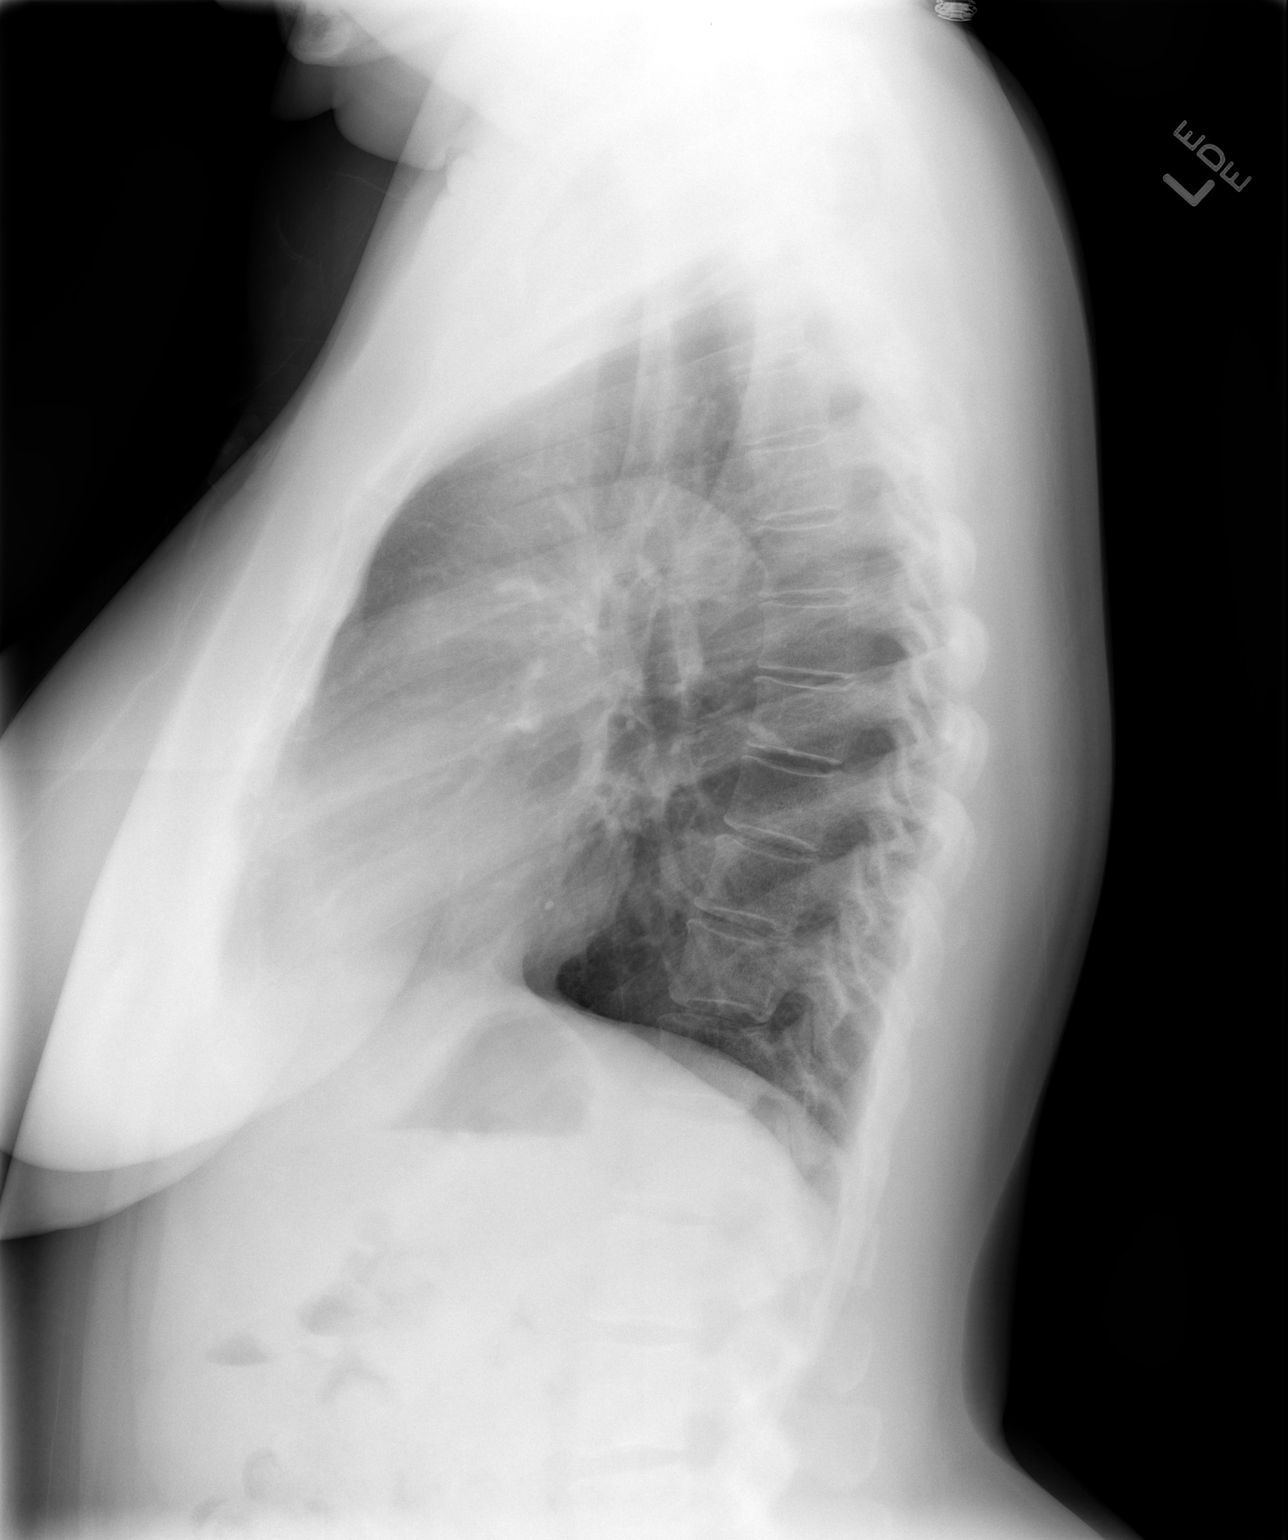

[2 of 2 positions shown; findings below may reference images not displayed]

FINDINGS: The heart size and pulmonary vascularity are normal and
the lungs are clear.  No bony abnormality.
IMPRESSION: Normal chest.

## 2009-05-13 ENCOUNTER — Encounter (INDEPENDENT_AMBULATORY_CARE_PROVIDER_SITE_OTHER): Payer: Self-pay | Admitting: Internal Medicine

## 2009-06-30 ENCOUNTER — Ambulatory Visit (HOSPITAL_COMMUNITY): Admission: RE | Admit: 2009-06-30 | Discharge: 2009-06-30 | Payer: Self-pay | Admitting: Internal Medicine

## 2009-06-30 IMAGING — CR DG FOOT COMPLETE 3+V*R*
3 series · 3 of 3 positions shown · non-contrast
Comparison: None

CLINICAL DATA: fifth metatarsal pain

RIGHT FOOT COMPLETE - 3+ VIEW

[t foot ap right]
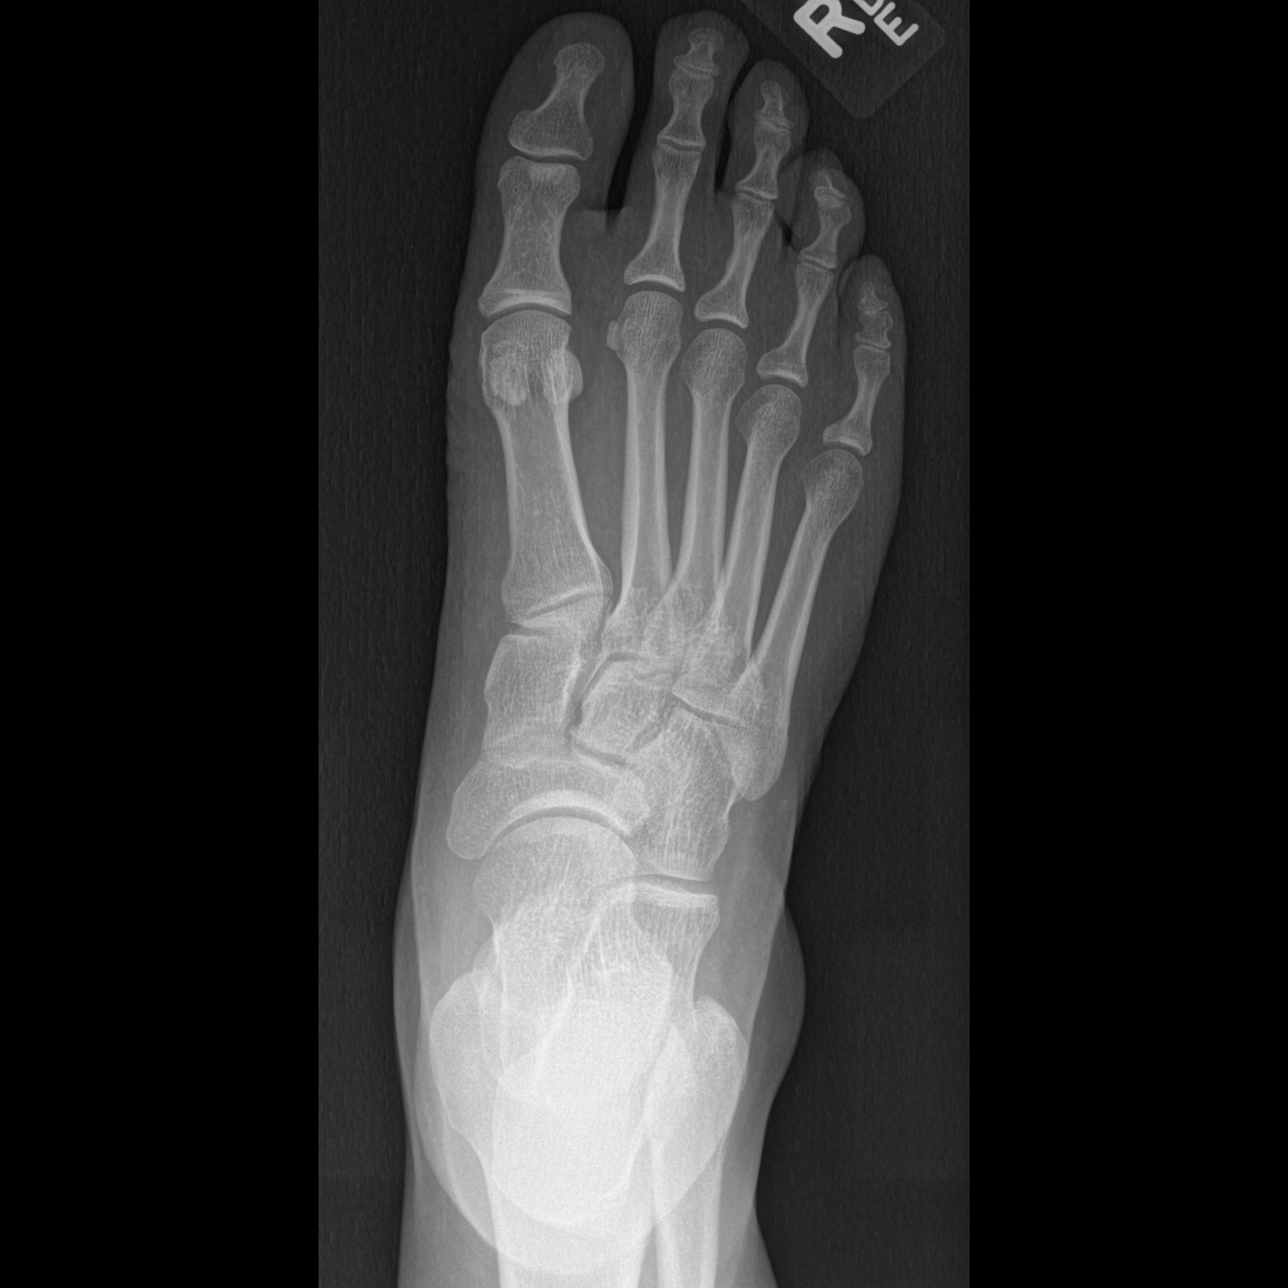

[t foot oblique right]
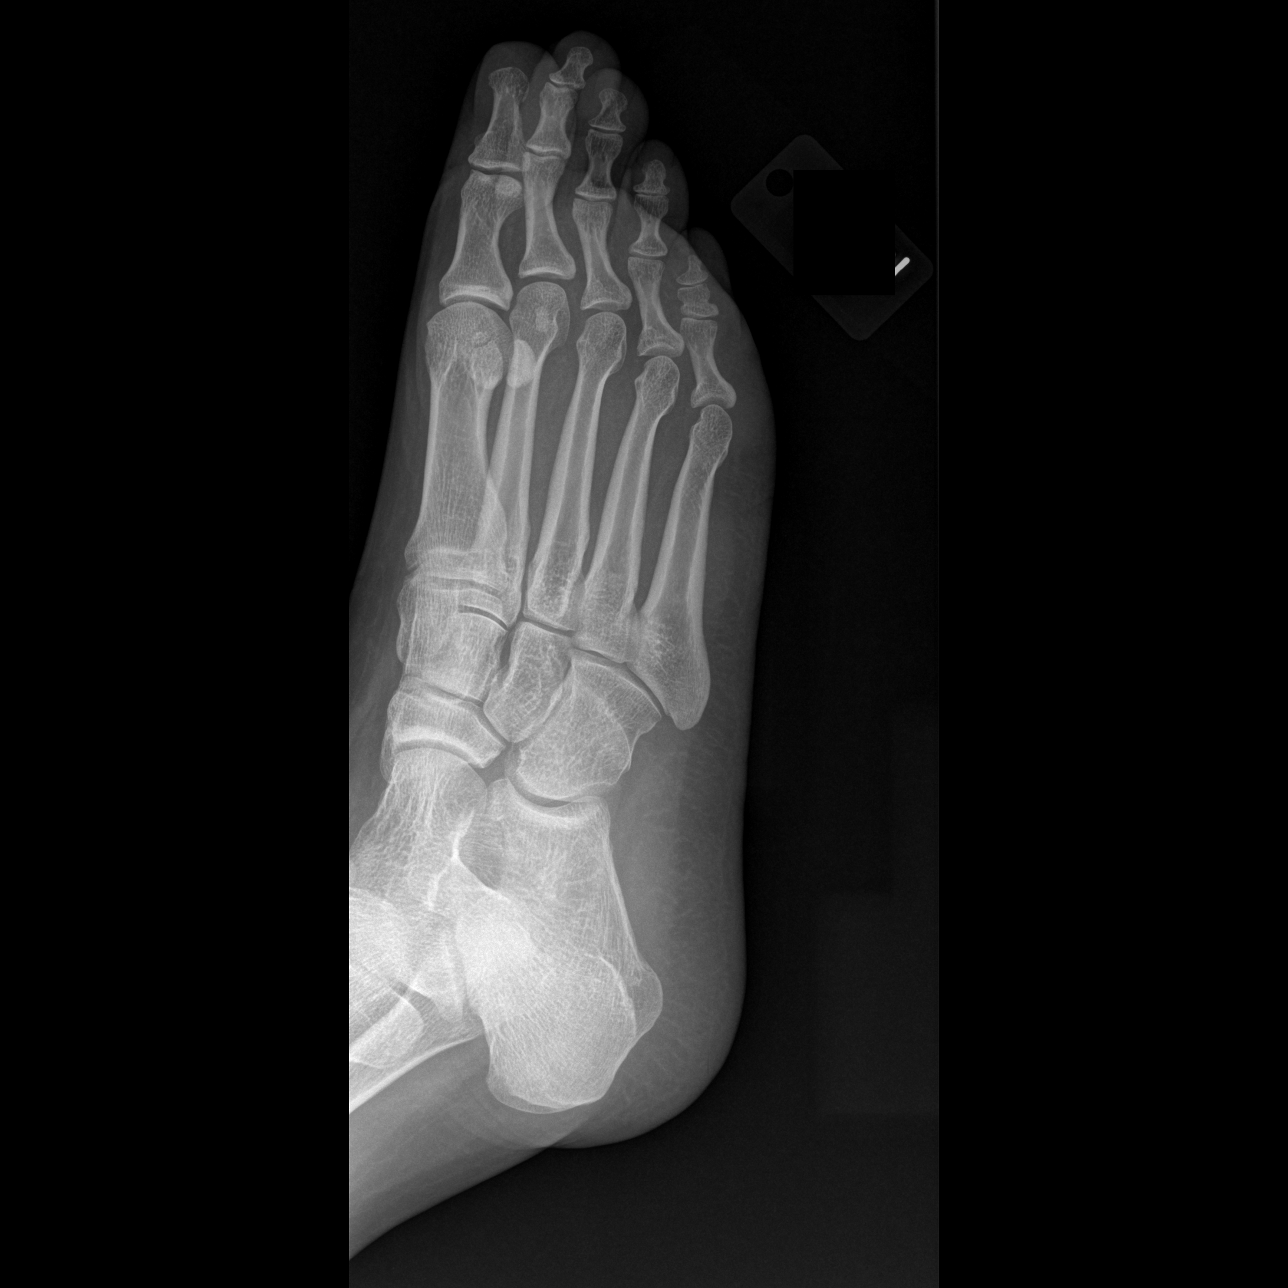

[t foot lat right]
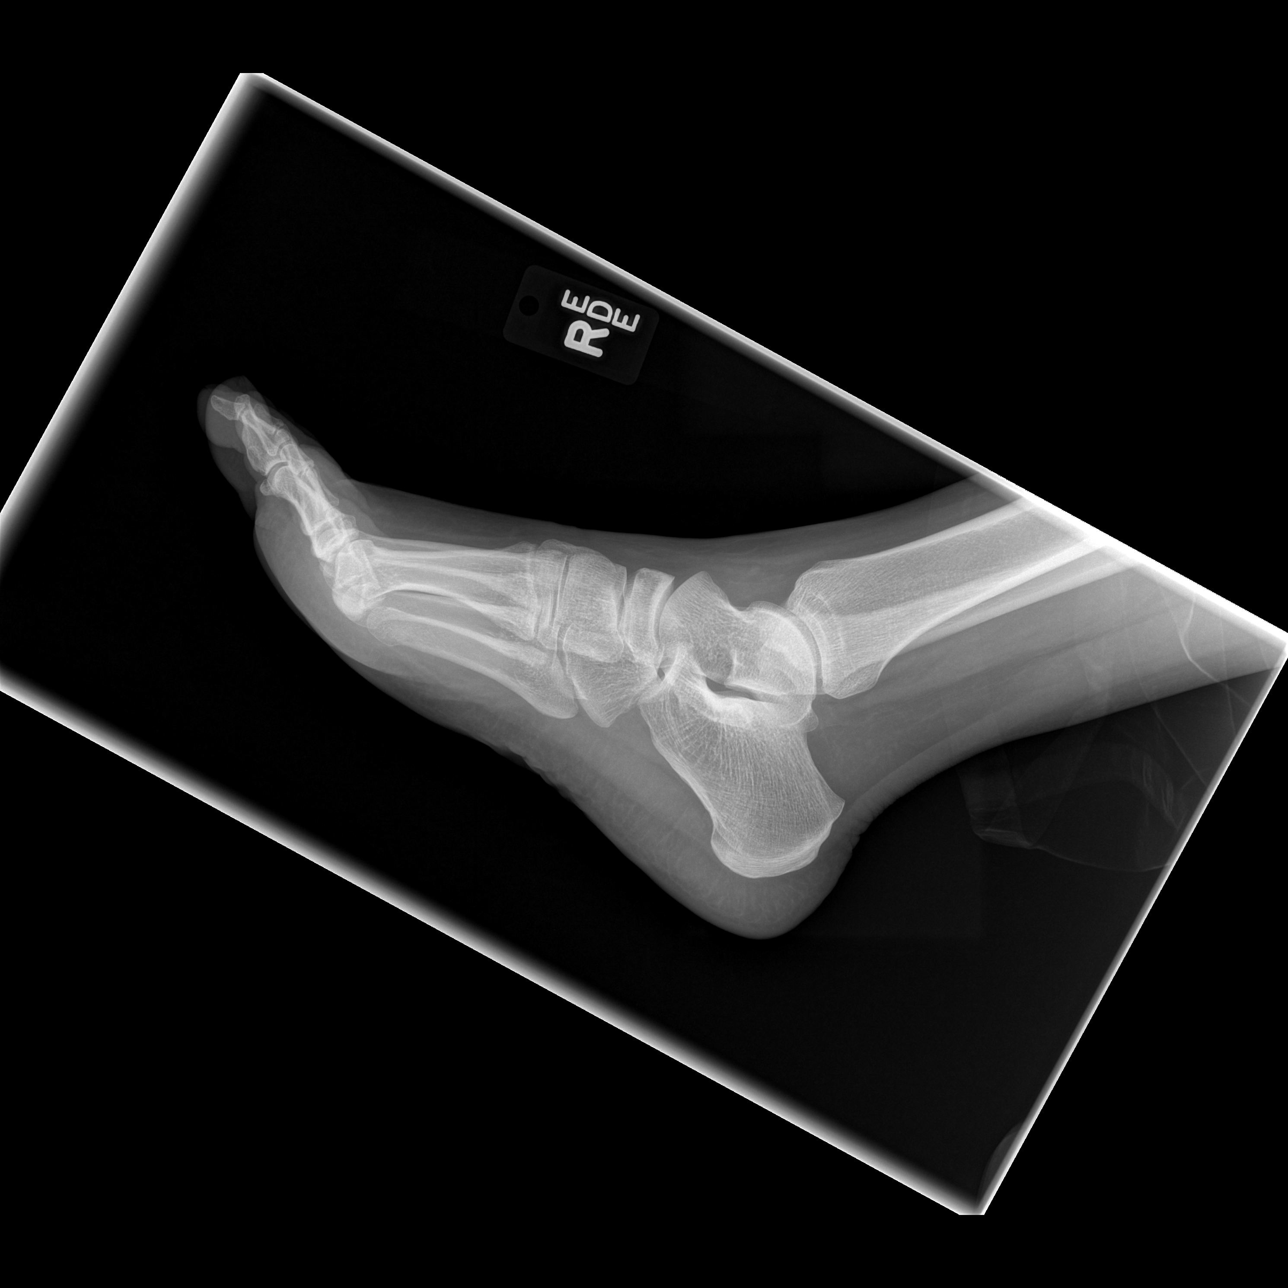

[3 of 3 positions shown; findings below may reference images not displayed]

FINDINGS: No evidence of fracture or stress fracture.  No evidence
of arthritis or other focal abnormality.
IMPRESSION: Normal radiographs

## 2010-04-12 ENCOUNTER — Emergency Department (HOSPITAL_COMMUNITY)
Admission: EM | Admit: 2010-04-12 | Discharge: 2010-04-12 | Payer: Self-pay | Source: Home / Self Care | Admitting: Emergency Medicine

## 2010-04-12 IMAGING — CR DG CHEST 2V
2 series · 2 of 2 positions shown · non-contrast
Comparison: Chest radiograph performed [DATE]

CLINICAL DATA: Chest pain and headache; arm numbness.

CHEST - 2 VIEW

[w chest pa]
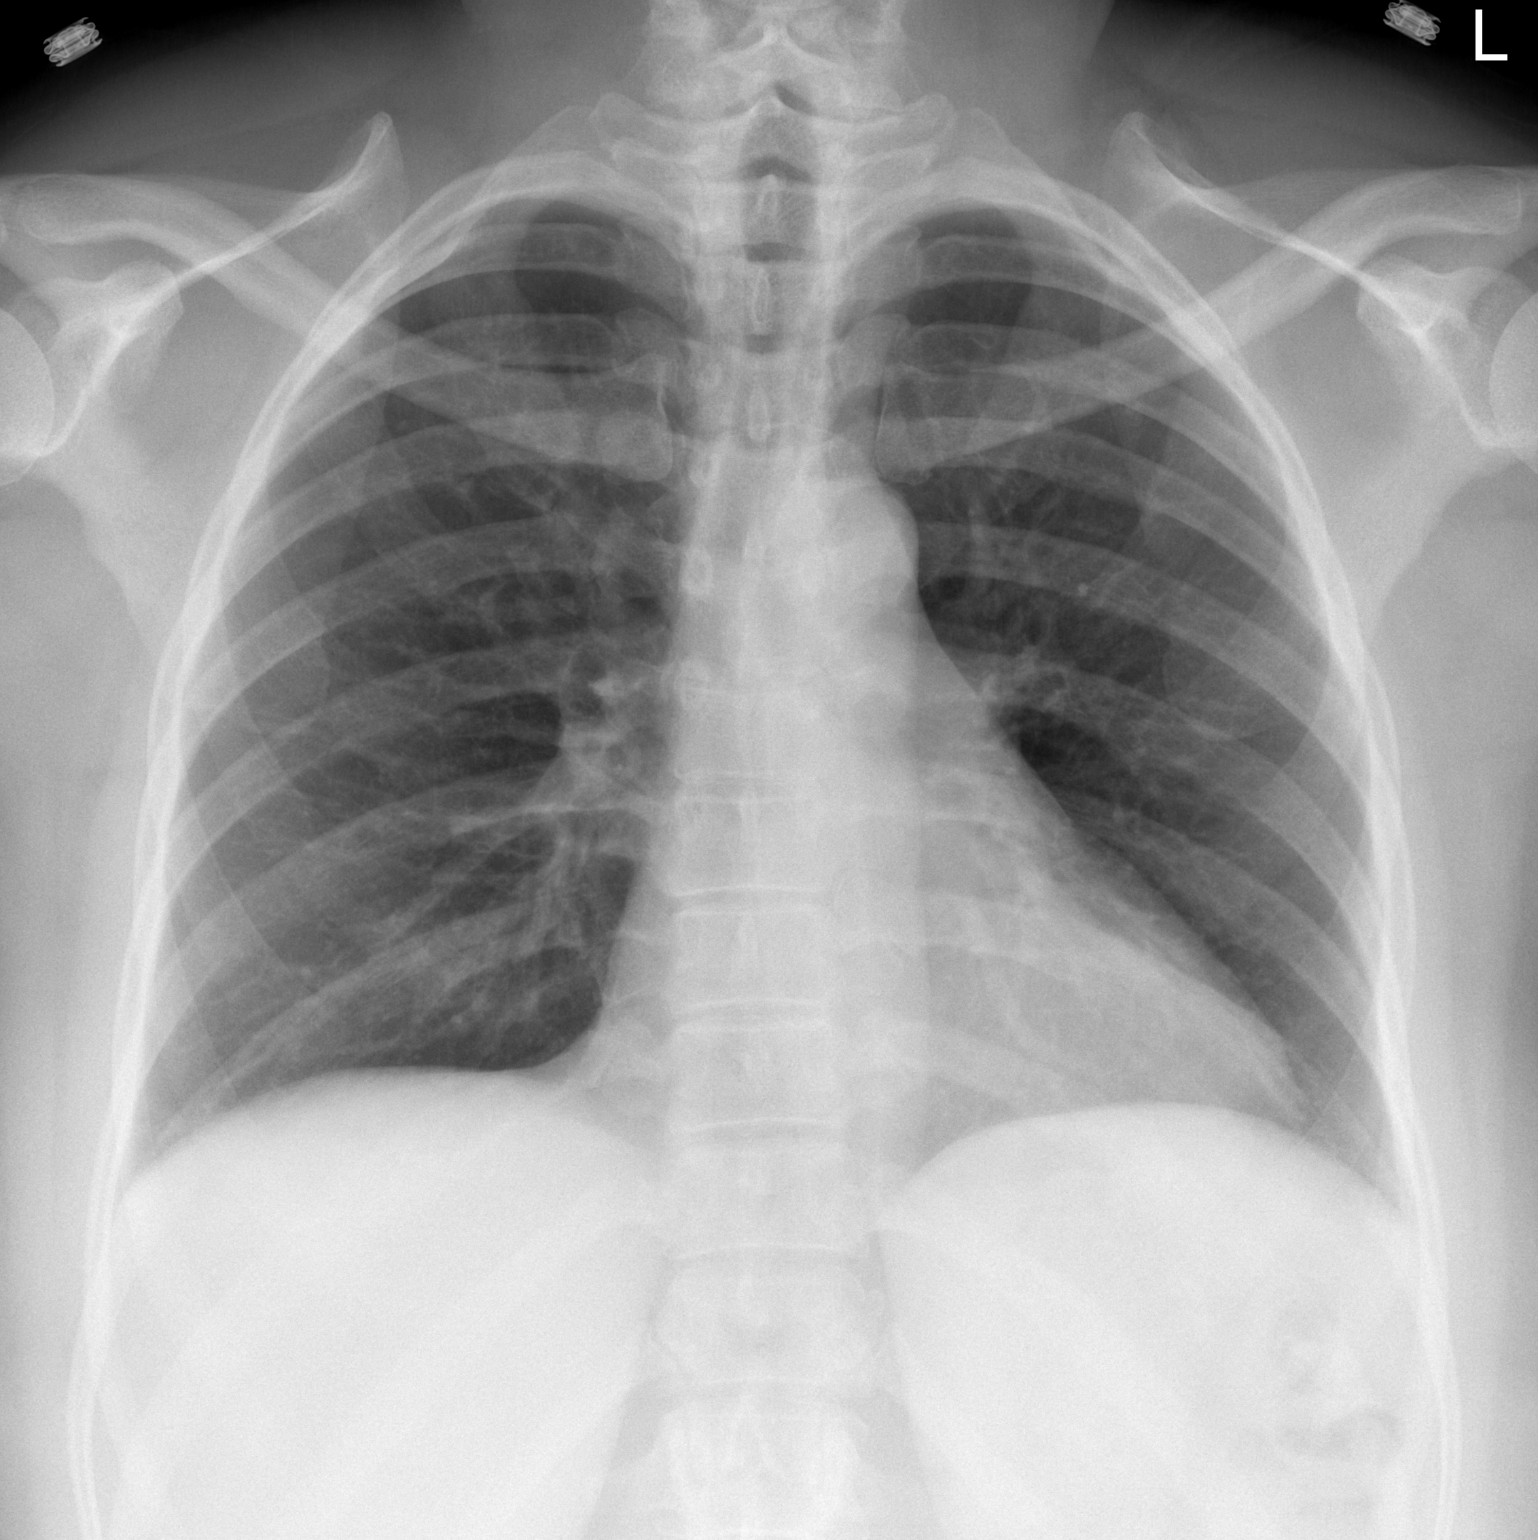

[w chest lat]
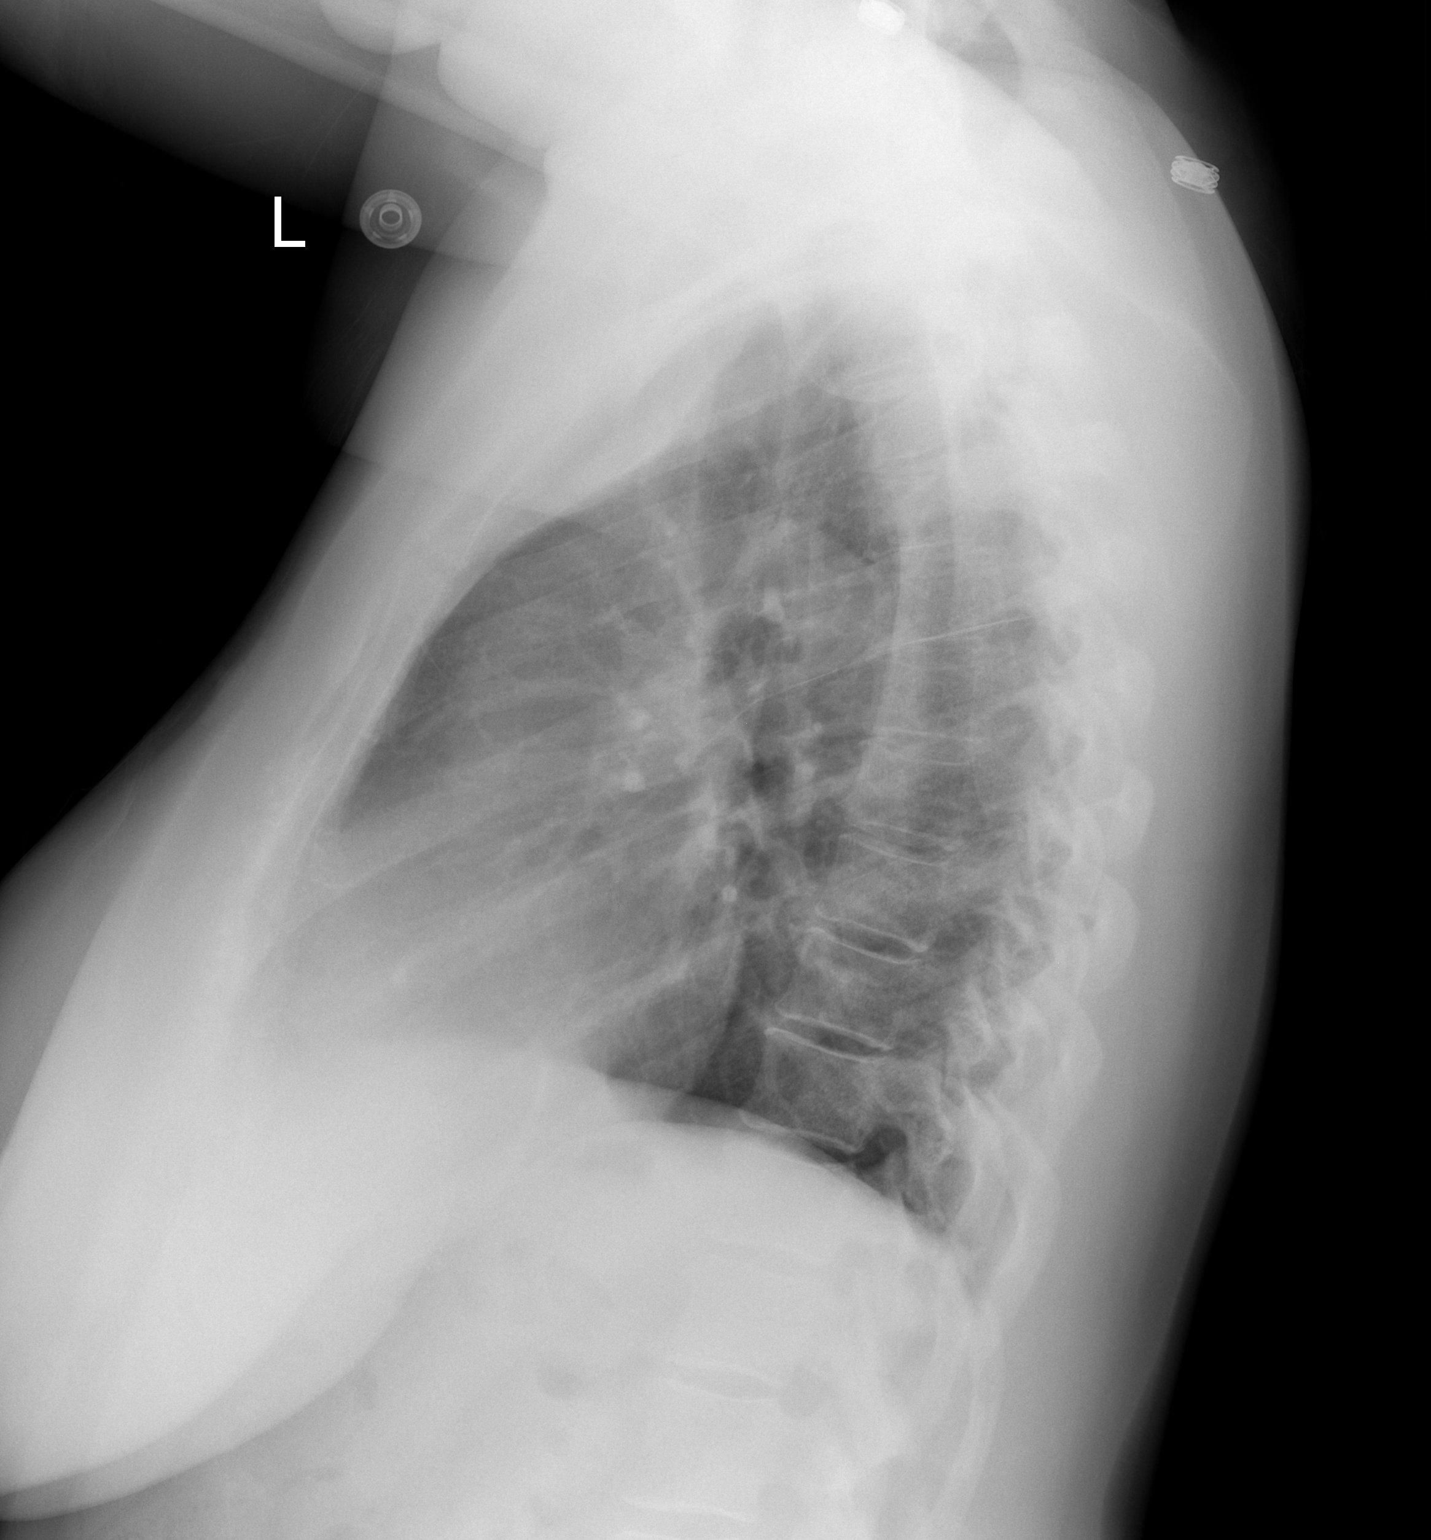

[2 of 2 positions shown; findings below may reference images not displayed]

FINDINGS: The lungs are well-aerated and clear.  There is no
evidence of focal opacification, pleural effusion or pneumothorax.
Stable minimal biapical pleural thickening is noted.

The heart is borderline normal in size; the mediastinal contour is
within normal limits.  No acute osseous abnormalities are seen.
IMPRESSION: No acute cardiopulmonary process seen; no displaced rib fractures
identified.

## 2010-04-12 IMAGING — CT CT ABD-PELV W/ CM
2 of 5 series · 16 of 46 positions shown, 18 images · IV contrast (omnipaque)
Comparison: Pelvic ultrasound performed [DATE]

CLINICAL DATA: Epigastric abdominal pain.

CT ABDOMEN AND PELVIS WITH CONTRAST
TECHNIQUE: Multidetector CT imaging of the abdomen and pelvis was
performed following the standard protocol during bolus
administration of intravenous contrast.
Contrast: 100 mL of Omnipaque 300 IV contrast

[Series 2: abd/pelv with 5.0 b31f st · axial · 0.70mm/px · z∈[-692,-307]mm · 13 of 87 slices shown, 15 images]
[im 5/87  soft-tissue]
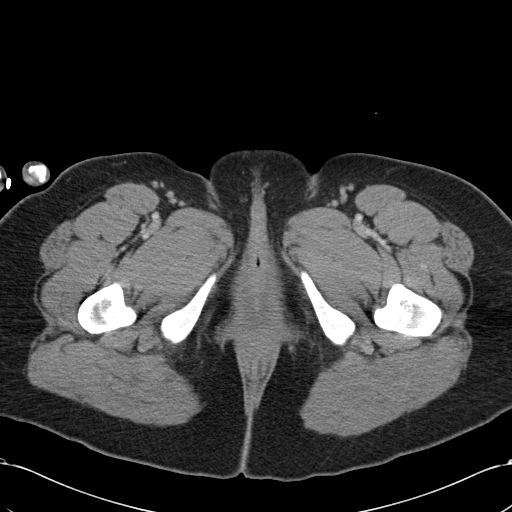
[im 5/87  bone]
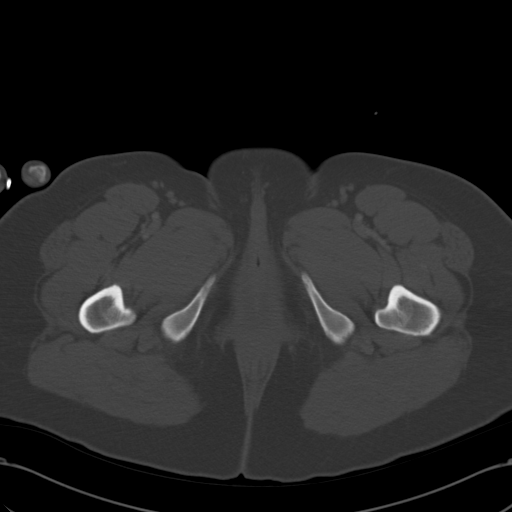
[im 13/87  soft-tissue]
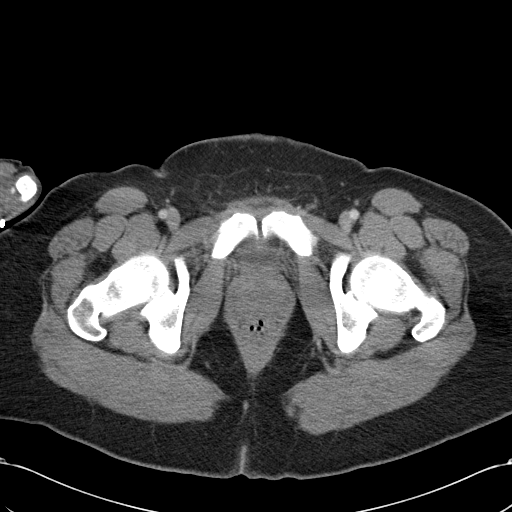
[im 18/87  soft-tissue]
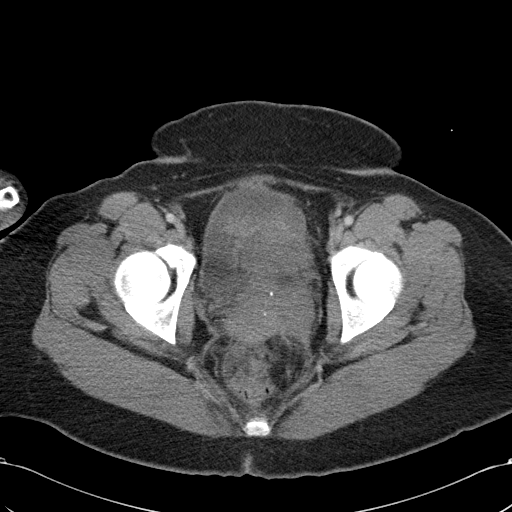
[im 26/87  soft-tissue]
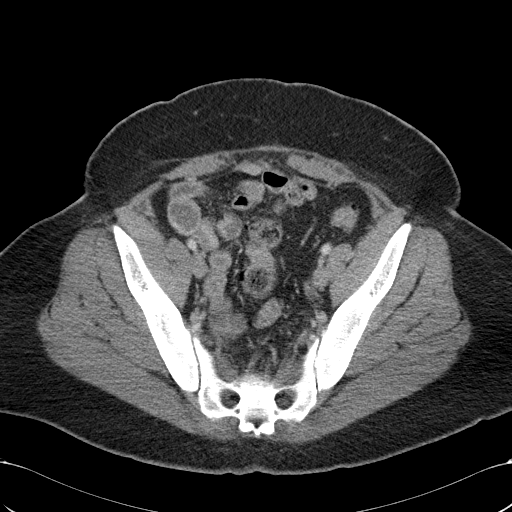
[im 31/87  soft-tissue]
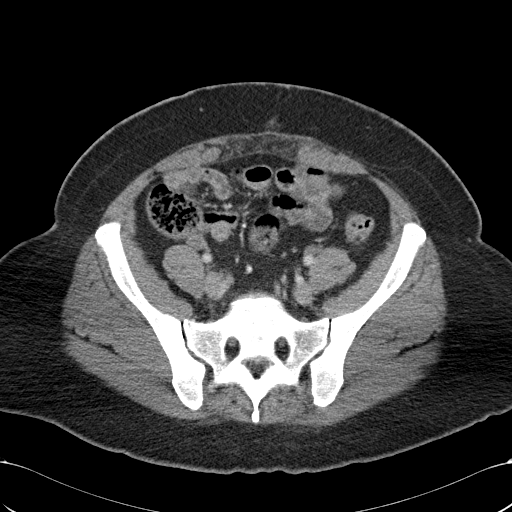
[im 39/87  soft-tissue]
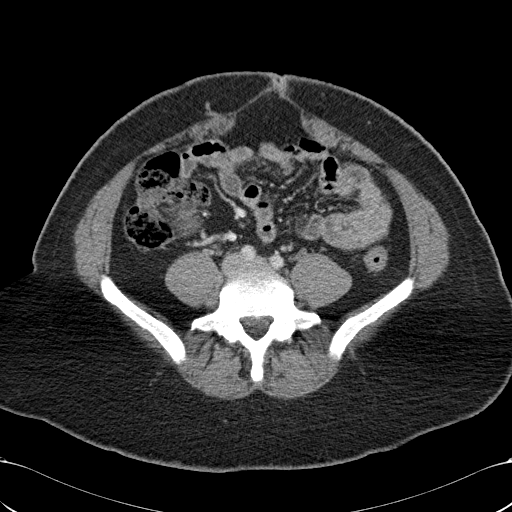
[im 44/87  soft-tissue]
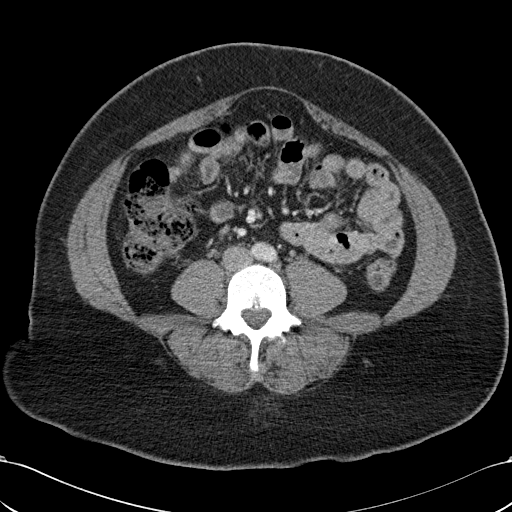
[im 48/87  soft-tissue]
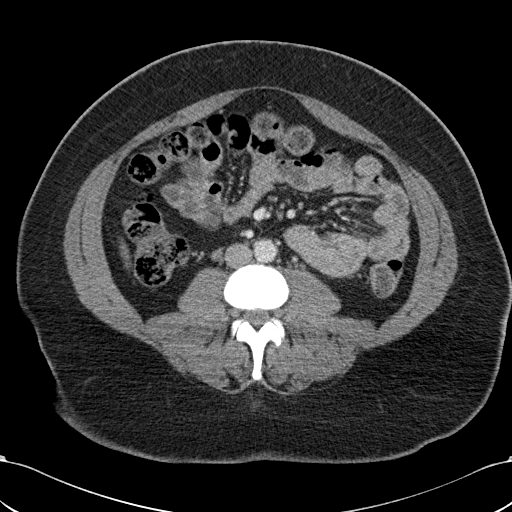
[im 56/87  soft-tissue]
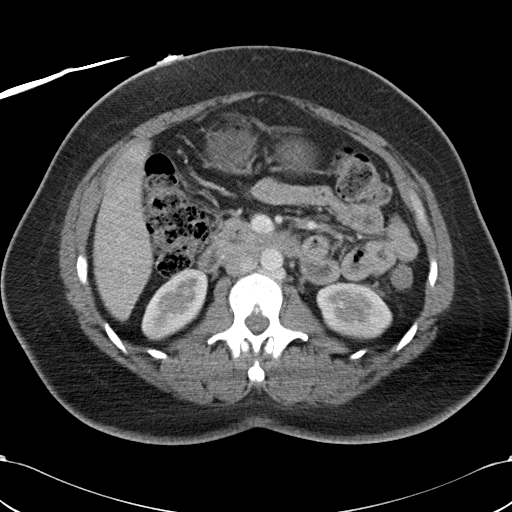
[im 56/87  bone]
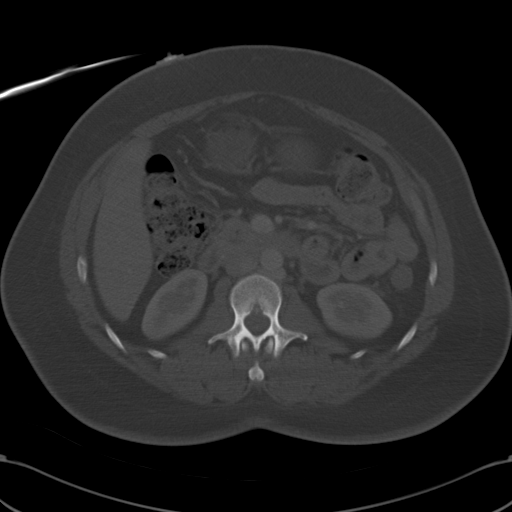
[im 61/87  soft-tissue]
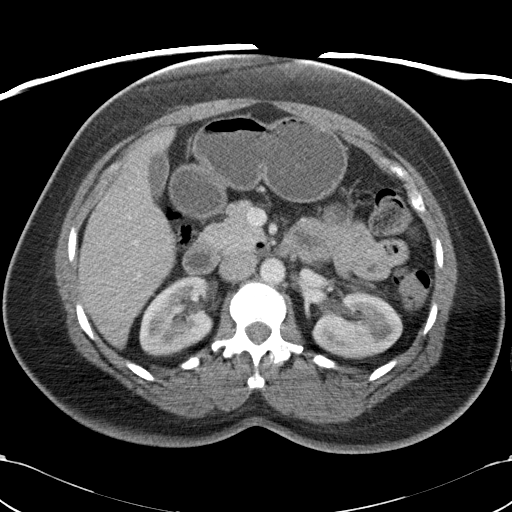
[im 69/87  soft-tissue]
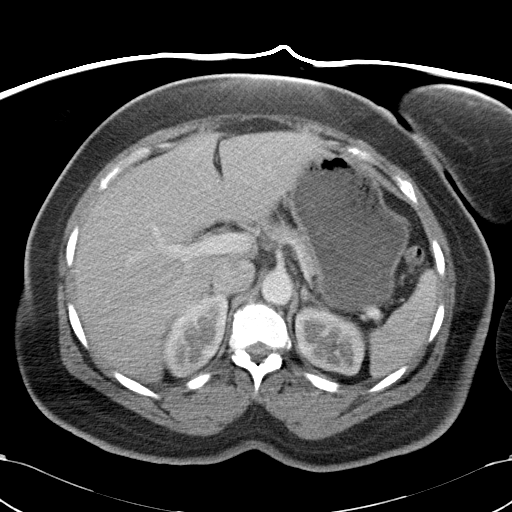
[im 74/87  soft-tissue]
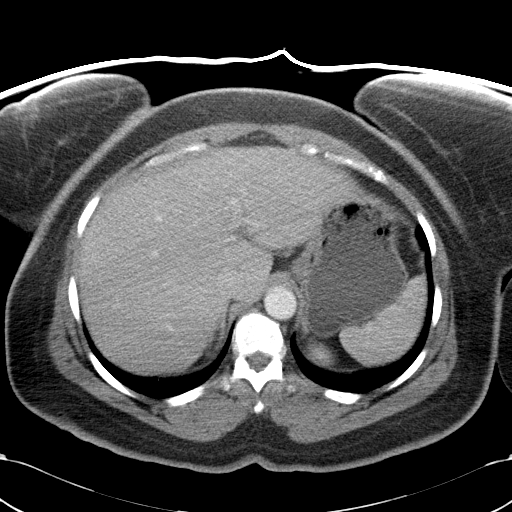
[im 82/87  soft-tissue]
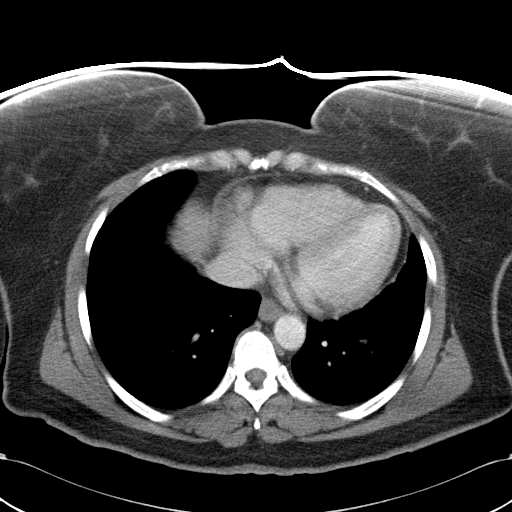

[Series 602: cor · coronal · 0.84mm/px · 3 of 136 slices shown]
[im 46/136  soft-tissue]
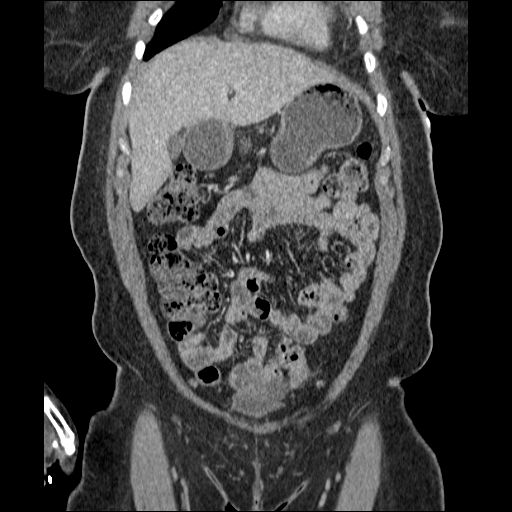
[im 61/136  soft-tissue]
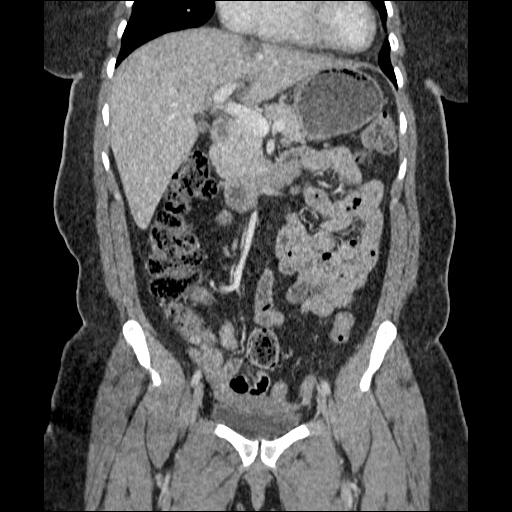
[im 76/136  soft-tissue]
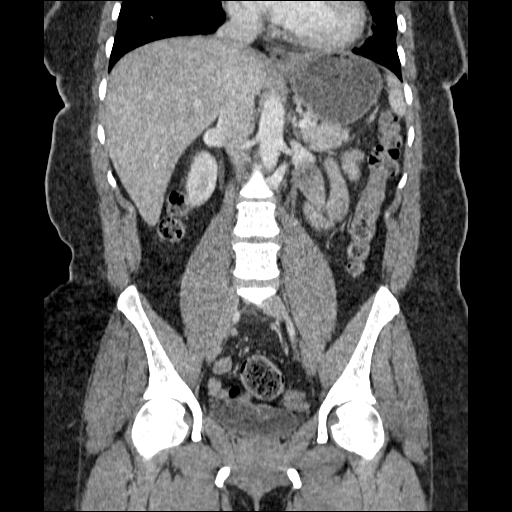

[16 of 46 positions shown; findings below may reference images not displayed]

FINDINGS: Minimal left basilar atelectasis is noted.

A 2.8 x 2.7 cm hypoattenuating lesion is noted within the left
hepatic dome; though this may reflect an hemangioma, malignancy
cannot be entirely excluded.  Dynamic liver protocol MRI would be
helpful for further evaluation, when and as deemed clinically
appropriate.

The gallbladder is partially decompressed and is within normal
limits.  The pancreas and adrenal glands are unremarkable.  The
kidneys are within normal limits bilaterally; no hydronephrosis or
perinephric stranding is seen.

No free fluid is identified.  The small bowel is unremarkable in
appearance.  The stomach is within normal limits.  No acute
vascular abnormalities are seen.  A phlebolith is noted within the
left gonadal vein.

A small broad-based herniation is noted at the umbilicus,
containing only fat.

The appendix is normal in caliber and extends deep into the right
hemipelvis, to the midline. There is no evidence for appendicitis.
The colon is partially filled with stool, and appears grossly
unremarkable.

The bladder is mildly distended.  Incidental note is made of a
prominent focal urachal remnant, containing a small amount of
fluid.  The soft tissue component measures approximately 1.9 x
x 0.7 cm.  The patient is status post hysterectomy; no suspicious
adnexal masses are seen.

Vague poorly characterized soft tissue density is noted overlying
the bladder; this is thought to reflect small bowel loops.  No
inguinal lymphadenopathy is seen.

No acute osseous abnormalities are identified.
IMPRESSION: 1.  No acute abnormalities identified in the abdomen or pelvis.

[DATE] x 2.7 cm hypoattenuating lesion within the left hepatic
dome; though this may reflect an hemangioma, malignancy cannot be
entirely excluded.  Dynamic liver protocol MRI would be helpful for
further evaluation, when and as deemed clinically appropriate.
3.  Prominent focal urachal remnant noted arising superior to the
bladder, containing a small amount of fluid.  The soft tissue
component measures 1.9 x 1.9 x 0.7 cm.  Suggest follow-up labs and
further evaluation to exclude malignancy.
4.  Small broad-based herniation of the umbilicus, containing only
fat.

## 2010-04-12 IMAGING — CT CT HEAD W/O CM
1 series · 16 of 30 positions shown, 20 images · non-contrast
Comparison: CT of the head performed [DATE]

CLINICAL DATA: Headache and left arm pain.

CT HEAD WITHOUT CONTRAST
TECHNIQUE: Contiguous axial images were obtained from the base of
the skull through the vertex without contrast.

[Series 2: head routine 4.8 h37s · axial · 0.43mm/px · z∈[-48,+80]mm · 16 of 30 slices shown, 20 images]
[im 2/30  brain]
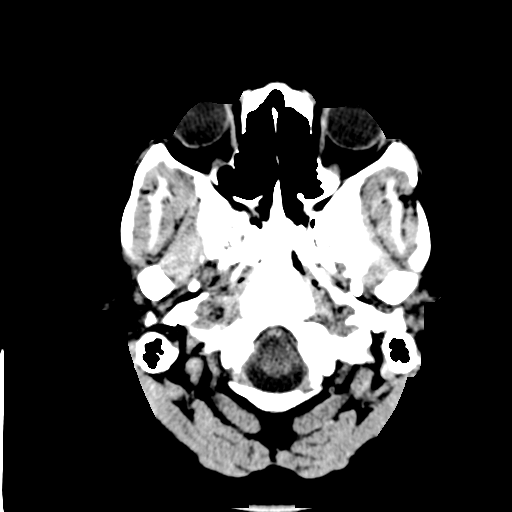
[im 2/30  bone]
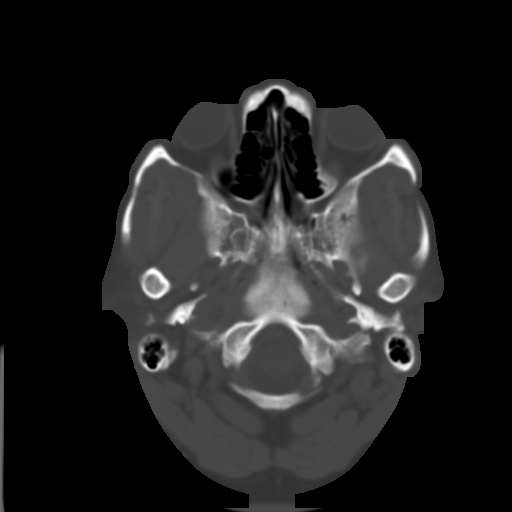
[im 4/30  brain]
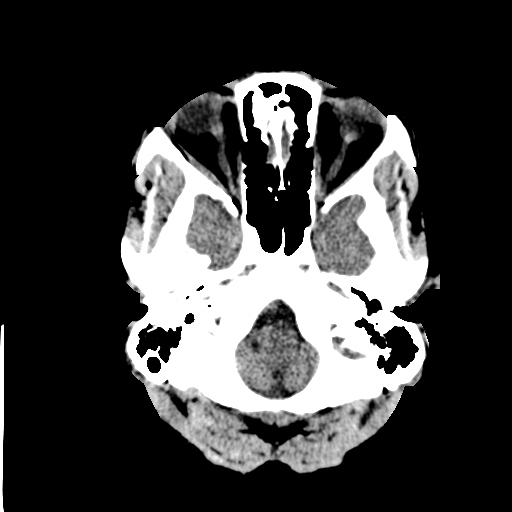
[im 6/30  brain]
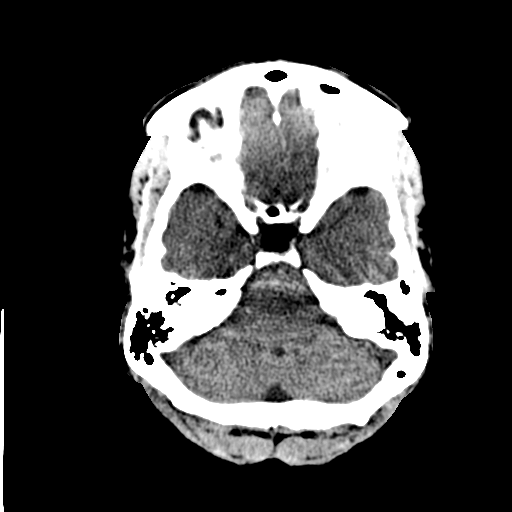
[im 8/30  brain]
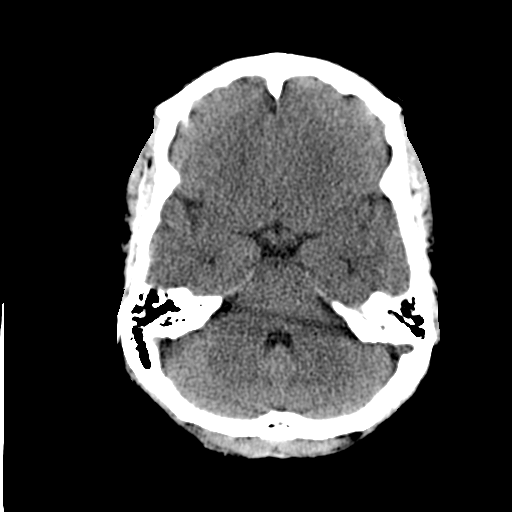
[im 9/30  brain]
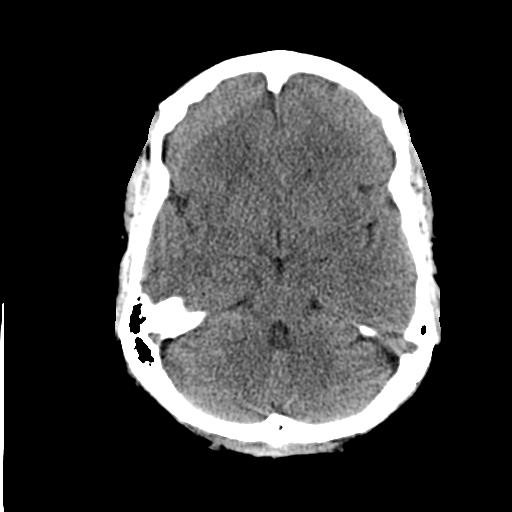
[im 9/30  bone]
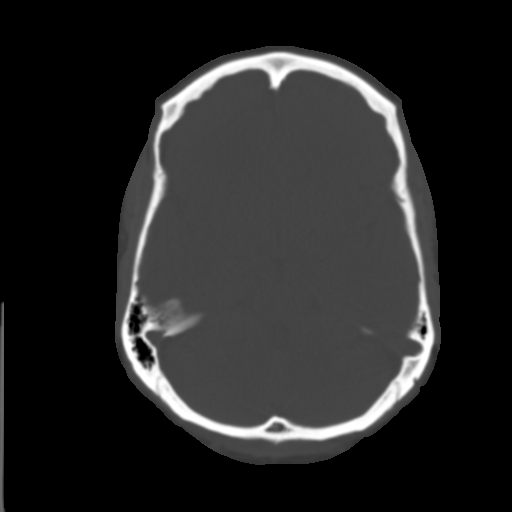
[im 11/30  brain]
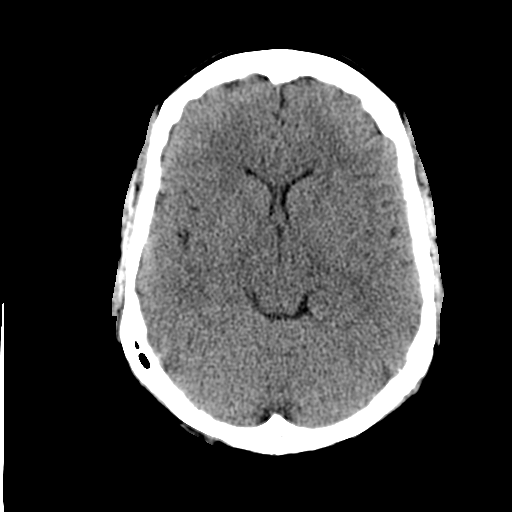
[im 13/30  brain]
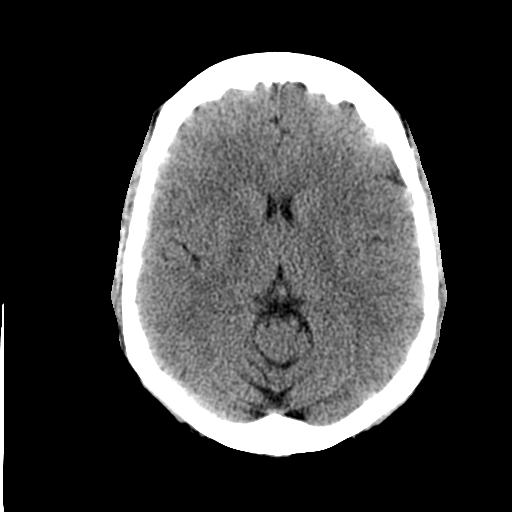
[im 15/30  brain]
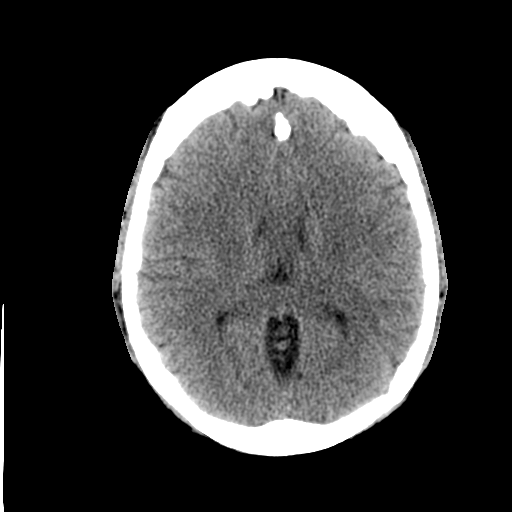
[im 16/30  brain]
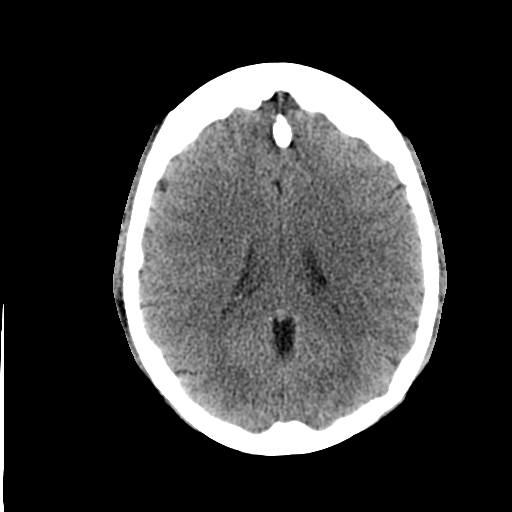
[im 16/30  bone]
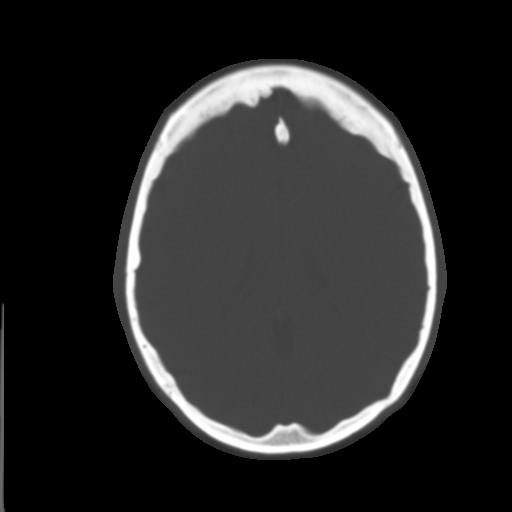
[im 18/30  brain]
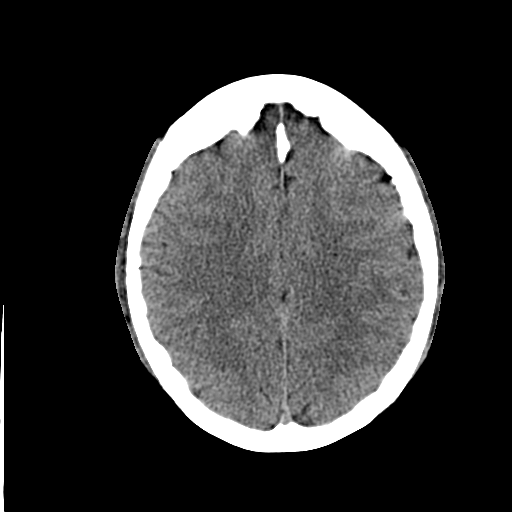
[im 20/30  brain]
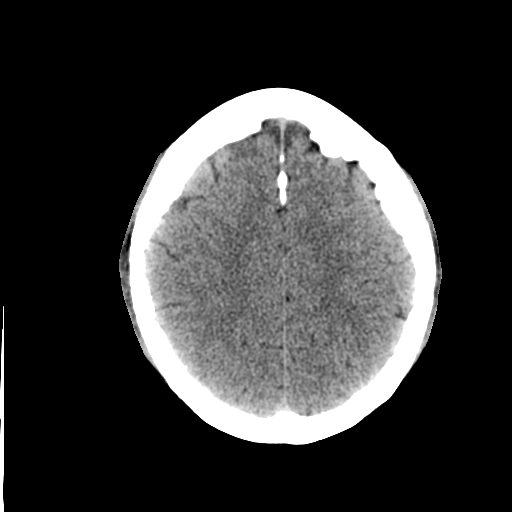
[im 22/30  brain]
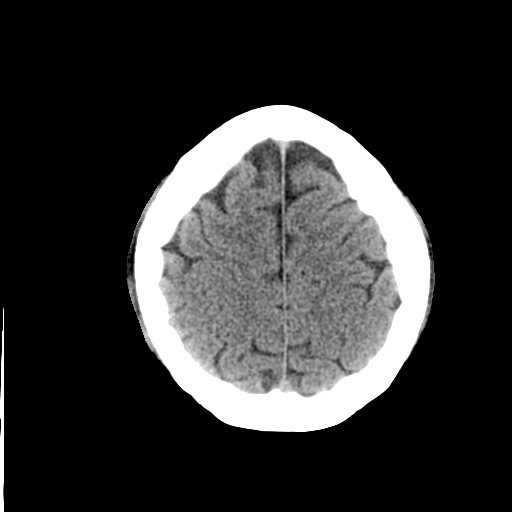
[im 23/30  brain]
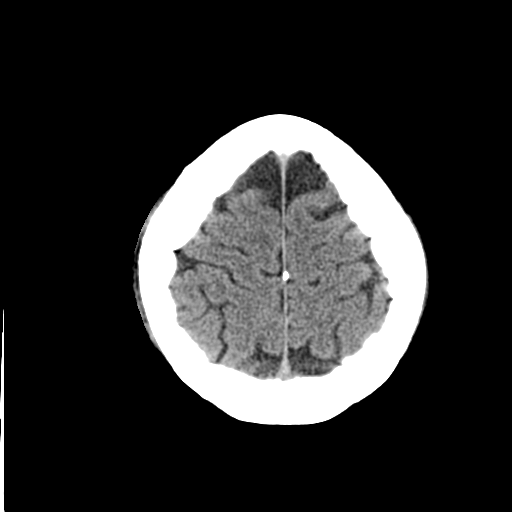
[im 23/30  bone]
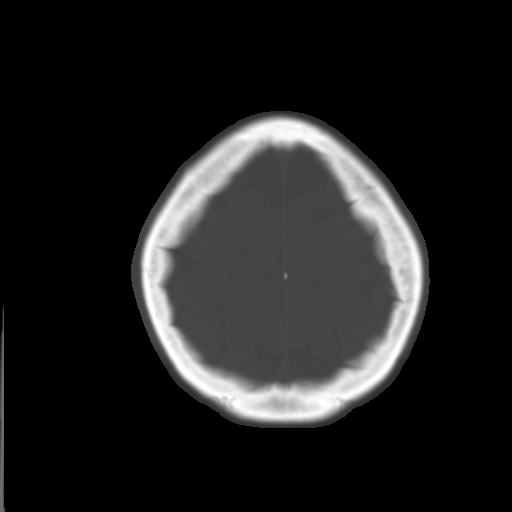
[im 25/30  brain]
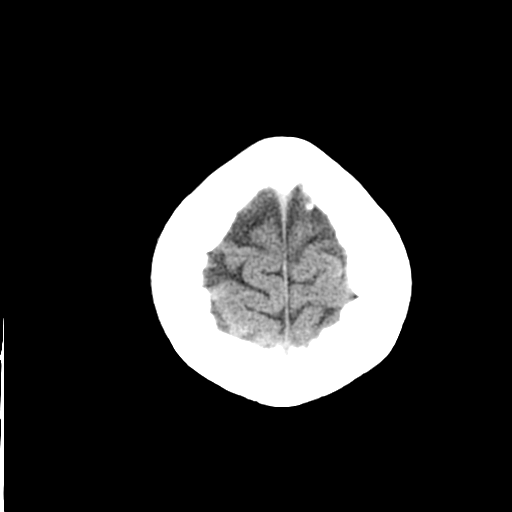
[im 27/30  brain]
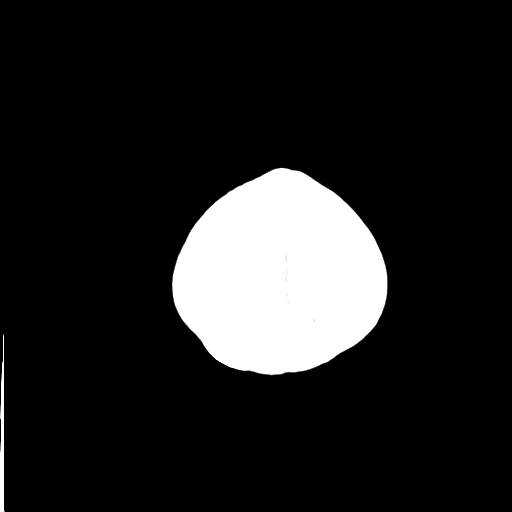
[im 29/30  brain]
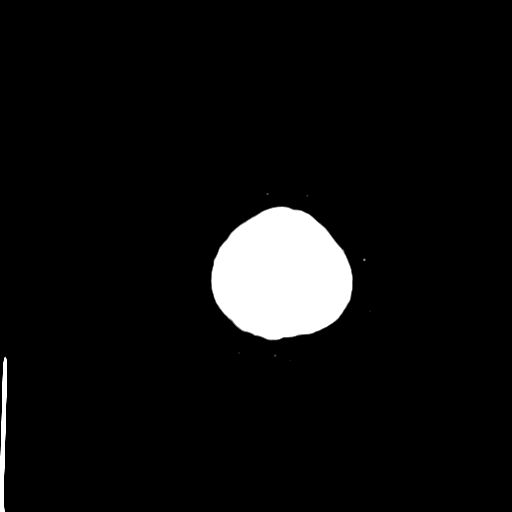

[16 of 30 positions shown; findings below may reference images not displayed]

FINDINGS: There is no evidence of acute infarction, mass lesion, or
intra- or extra-axial hemorrhage on CT.

The posterior fossa, including the cerebellum, brainstem and fourth
ventricle, is within normal limits.  The third and lateral
ventricles, and basal ganglia are unremarkable in appearance.  The
cerebral hemispheres are symmetric in appearance, with normal gray-
white differentiation.  No mass effect or midline shift is seen.

There is no evidence of fracture; mild hyperostosis frontalis
interna is noted.  The visualized portions of the orbits are within
normal limits.  The paranasal sinuses and mastoid air cells are
well-aerated.  No significant soft tissue abnormalities are seen.
Scattered punctate soft tissue calcifications are noted.
IMPRESSION: Unremarkable noncontrast CT of the head.

## 2010-06-29 LAB — POCT CARDIAC MARKERS
CKMB, poc: <1 ng/mL — ABNORMAL LOW (ref 1.0–8.0)
CKMB, poc: <1 ng/mL — ABNORMAL LOW (ref 1.0–8.0)
Myoglobin, poc: 65.4 ng/mL (ref 12–200)
Myoglobin, poc: 92.9 ng/mL (ref 12–200)
Troponin i, poc: <0.05 ng/mL (ref 0.00–0.09)

## 2010-06-29 LAB — CBC
HCT: 33.6 % — ABNORMAL LOW (ref 36.0–46.0)
Hemoglobin: 10.9 g/dL — ABNORMAL LOW (ref 12.0–15.0)
MCH: 30.6 pg (ref 26.0–34.0)
MCHC: 32.4 g/dL (ref 30.0–36.0)
MCV: 94.4 fL (ref 78.0–100.0)
RBC: 3.56 MIL/uL — ABNORMAL LOW (ref 3.87–5.11)
RDW: 14 % (ref 11.5–15.5)
WBC: 7.4 10*3/uL (ref 4.0–10.5)

## 2010-06-29 LAB — DIFFERENTIAL
Eosinophils Absolute: 0.1 10*3/uL (ref 0.0–0.7)
Lymphocytes Relative: 37 % (ref 12–46)
Monocytes Absolute: 0.7 10*3/uL (ref 0.1–1.0)
Neutro Abs: 3.7 10*3/uL (ref 1.7–7.7)

## 2010-06-29 LAB — COMPREHENSIVE METABOLIC PANEL
ALT: 16 U/L (ref 0–35)
Albumin: 3.7 g/dL (ref 3.5–5.2)
Alkaline Phosphatase: 69 U/L (ref 39–117)
CO2: 29 mEq/L (ref 19–32)
GFR calc non Af Amer: >60 mL/min (ref >60)
Total Bilirubin: 0.3 mg/dL (ref 0.3–1.2)
Total Protein: 7.2 g/dL (ref 6.0–8.3)

## 2010-07-05 LAB — BASIC METABOLIC PANEL
BUN: 13 mg/dL (ref 6–23)
BUN: 8 mg/dL (ref 6–23)
CO2: 28 mEq/L (ref 19–32)
Calcium: 9.4 mg/dL (ref 8.4–10.5)
Chloride: 98 mEq/L (ref 96–112)
Creatinine, Ser: 0.88 mg/dL (ref 0.4–1.2)
Creatinine, Ser: 1.02 mg/dL (ref 0.4–1.2)
GFR calc Af Amer: >60 mL/min (ref >60)
GFR calc Af Amer: >60 mL/min (ref >60)
GFR calc non Af Amer: 58 mL/min — ABNORMAL LOW (ref >60)
Potassium: 4 mEq/L (ref 3.5–5.1)

## 2010-07-05 LAB — CBC
HCT: 32.4 % — ABNORMAL LOW (ref 36.0–46.0)
HCT: 32.5 % — ABNORMAL LOW (ref 36.0–46.0)
MCHC: 33.7 g/dL (ref 30.0–36.0)
MCV: 95.8 fL (ref 78.0–100.0)
MCV: 96 fL (ref 78.0–100.0)
Platelets: 336 10*3/uL (ref 150–400)
Platelets: 401 10*3/uL — ABNORMAL HIGH (ref 150–400)
RBC: 3.39 MIL/uL — ABNORMAL LOW (ref 3.87–5.11)
RBC: 3.84 MIL/uL — ABNORMAL LOW (ref 3.87–5.11)
RDW: 13.8 % (ref 11.5–15.5)
RDW: 14 % (ref 11.5–15.5)
RDW: 14 % (ref 11.5–15.5)
WBC: 7.2 10*3/uL (ref 4.0–10.5)
WBC: 7.7 10*3/uL (ref 4.0–10.5)

## 2010-07-05 LAB — POCT I-STAT, CHEM 8
BUN: 15 mg/dL (ref 6–23)
Calcium, Ion: 1.2 mmol/L (ref 1.12–1.32)
Chloride: 105 mEq/L (ref 96–112)
Creatinine, Ser: 0.8 mg/dL (ref 0.4–1.2)
Glucose, Bld: 94 mg/dL (ref 70–99)
HCT: 35 % — ABNORMAL LOW (ref 36.0–46.0)
Hemoglobin: 11.9 g/dL — ABNORMAL LOW (ref 12.0–15.0)
Potassium: 4 mEq/L (ref 3.5–5.1)
Sodium: 139 mEq/L (ref 135–145)
TCO2: 29 mmol/L (ref 0–100)

## 2010-07-05 LAB — POCT CARDIAC MARKERS
CKMB, poc: <1 ng/mL — ABNORMAL LOW (ref 1.0–8.0)
CKMB, poc: <1 ng/mL — ABNORMAL LOW (ref 1.0–8.0)
Myoglobin, poc: 47.3 ng/mL (ref 12–200)
Myoglobin, poc: 81.6 ng/mL (ref 12–200)
Troponin i, poc: <0.05 ng/mL (ref 0.00–0.09)

## 2010-07-05 LAB — URINALYSIS, ROUTINE W REFLEX MICROSCOPIC
Bilirubin Urine: NEGATIVE
Nitrite: NEGATIVE
Protein, ur: NEGATIVE mg/dL
Specific Gravity, Urine: 1.017 (ref 1.005–1.030)
Urobilinogen, UA: 0.2 mg/dL (ref 0.0–1.0)

## 2010-07-05 LAB — LIPID PANEL
Cholesterol: 220 mg/dL — ABNORMAL HIGH (ref 0–200)
HDL: 41 mg/dL (ref >39)
LDL Cholesterol: 145 mg/dL — ABNORMAL HIGH (ref 0–99)
Total CHOL/HDL Ratio: 5.4 RATIO

## 2010-07-05 LAB — CARDIAC PANEL(CRET KIN+CKTOT+MB+TROPI)
CK, MB: 0.7 ng/mL (ref 0.3–4.0)
CK, MB: 0.7 ng/mL (ref 0.3–4.0)
CK, MB: 0.8 ng/mL (ref 0.3–4.0)
Relative Index: 0.6 (ref 0.0–2.5)
Relative Index: 0.6 (ref 0.0–2.5)
Relative Index: 0.6 (ref 0.0–2.5)
Relative Index: 0.6 (ref 0.0–2.5)
Relative Index: 0.7 (ref 0.0–2.5)
Total CK: 114 U/L (ref 7–177)
Total CK: 119 U/L (ref 7–177)
Troponin I: 0.01 ng/mL (ref 0.00–0.06)
Troponin I: 0.03 ng/mL (ref 0.00–0.06)

## 2010-07-05 LAB — DIFFERENTIAL
Basophils Absolute: 0.1 10*3/uL (ref 0.0–0.1)
Basophils Absolute: 0.1 10*3/uL (ref 0.0–0.1)
Basophils Relative: 1 % (ref 0–1)
Basophils Relative: 2 % — ABNORMAL HIGH (ref 0–1)
Eosinophils Absolute: 0.1 10*3/uL (ref 0.0–0.7)
Eosinophils Relative: 1 % (ref 0–5)
Lymphs Abs: 2.3 10*3/uL (ref 0.7–4.0)
Neutro Abs: 3.5 10*3/uL (ref 1.7–7.7)
Neutrophils Relative %: 58 % (ref 43–77)
Neutrophils Relative %: 60 % (ref 43–77)

## 2010-07-05 LAB — HEMOGLOBIN A1C: Mean Plasma Glucose: 111 mg/dL

## 2010-07-23 LAB — URINALYSIS, ROUTINE W REFLEX MICROSCOPIC
Bilirubin Urine: NEGATIVE
Hgb urine dipstick: NEGATIVE
Ketones, ur: 15 mg/dL — AB
Nitrite: NEGATIVE
Protein, ur: NEGATIVE mg/dL
Urobilinogen, UA: 0.2 mg/dL (ref 0.0–1.0)

## 2010-07-24 LAB — GLUCOSE, CAPILLARY: Glucose-Capillary: 93 mg/dL (ref 70–99)

## 2010-07-28 LAB — COMPREHENSIVE METABOLIC PANEL
AST: 20 U/L (ref 0–37)
Albumin: 3.7 g/dL (ref 3.5–5.2)
Alkaline Phosphatase: 64 U/L (ref 39–117)
BUN: 8 mg/dL (ref 6–23)
Chloride: 103 mEq/L (ref 96–112)
GFR calc Af Amer: >60 mL/min (ref >60)
Potassium: 4.1 mEq/L (ref 3.5–5.1)
Sodium: 139 mEq/L (ref 135–145)
Total Protein: 7.9 g/dL (ref 6.0–8.3)

## 2010-07-28 LAB — GLUCOSE, CAPILLARY: Glucose-Capillary: 73 mg/dL (ref 70–99)

## 2020-09-11 ENCOUNTER — Other Ambulatory Visit: Payer: Self-pay

## 2020-09-11 ENCOUNTER — Emergency Department (HOSPITAL_BASED_OUTPATIENT_CLINIC_OR_DEPARTMENT_OTHER): Payer: Medicaid Other

## 2020-09-11 ENCOUNTER — Encounter (HOSPITAL_BASED_OUTPATIENT_CLINIC_OR_DEPARTMENT_OTHER): Payer: Self-pay

## 2020-09-11 ENCOUNTER — Encounter: Payer: Self-pay | Admitting: Physical Therapy

## 2020-09-11 ENCOUNTER — Ambulatory Visit: Payer: Medicaid Other | Attending: Sports Medicine | Admitting: Physical Therapy

## 2020-09-11 ENCOUNTER — Emergency Department (HOSPITAL_BASED_OUTPATIENT_CLINIC_OR_DEPARTMENT_OTHER)
Admission: EM | Admit: 2020-09-11 | Discharge: 2020-09-11 | Disposition: A | Discharge status: Home / Self Care | Payer: Medicaid Other | Attending: Emergency Medicine | Admitting: Emergency Medicine

## 2020-09-11 VITALS — BP 98/72 | HR 72

## 2020-09-11 DIAGNOSIS — R262 Difficulty in walking, not elsewhere classified: Secondary | ICD-10-CM | POA: Insufficient documentation

## 2020-09-11 DIAGNOSIS — M62838 Other muscle spasm: Secondary | ICD-10-CM | POA: Insufficient documentation

## 2020-09-11 DIAGNOSIS — G8929 Other chronic pain: Secondary | ICD-10-CM | POA: Diagnosis present

## 2020-09-11 DIAGNOSIS — M5441 Lumbago with sciatica, right side: Secondary | ICD-10-CM | POA: Diagnosis present

## 2020-09-11 DIAGNOSIS — M25552 Pain in left hip: Secondary | ICD-10-CM | POA: Diagnosis present

## 2020-09-11 DIAGNOSIS — R202 Paresthesia of skin: Secondary | ICD-10-CM

## 2020-09-11 DIAGNOSIS — R072 Precordial pain: Secondary | ICD-10-CM

## 2020-09-11 DIAGNOSIS — M5442 Lumbago with sciatica, left side: Secondary | ICD-10-CM | POA: Diagnosis not present

## 2020-09-11 DIAGNOSIS — R079 Chest pain, unspecified: Secondary | ICD-10-CM | POA: Diagnosis present

## 2020-09-11 LAB — CBC
HCT: 36.1 % (ref 36.0–46.0)
Hemoglobin: 11.9 g/dL — ABNORMAL LOW (ref 12.0–15.0)
MCH: 32.2 pg (ref 26.0–34.0)
MCHC: 33 g/dL (ref 30.0–36.0)
MCV: 97.8 fL (ref 80.0–100.0)
Platelets: 375 10*3/uL (ref 150–400)
RBC: 3.69 MIL/uL — ABNORMAL LOW (ref 3.87–5.11)
RDW: 14 % (ref 11.5–15.5)
WBC: 7.7 10*3/uL (ref 4.0–10.5)
nRBC: 0 % (ref 0.0–0.2)

## 2020-09-11 LAB — BASIC METABOLIC PANEL
Anion gap: 6 (ref 5–15)
BUN: 18 mg/dL (ref 6–20)
CO2: 29 mmol/L (ref 22–32)
Calcium: 8.9 mg/dL (ref 8.9–10.3)
Chloride: 103 mmol/L (ref 98–111)
Creatinine, Ser: 1.15 mg/dL — ABNORMAL HIGH (ref 0.44–1.00)
GFR, Estimated: 56 mL/min — ABNORMAL LOW (ref >60)
Glucose, Bld: 91 mg/dL (ref 70–99)
Potassium: 3.8 mmol/L (ref 3.5–5.1)
Sodium: 138 mmol/L (ref 135–145)

## 2020-09-11 LAB — PREGNANCY, URINE: Preg Test, Ur: NEGATIVE

## 2020-09-11 LAB — TROPONIN I (HIGH SENSITIVITY)
Troponin I (High Sensitivity): 2 ng/L (ref <18)
Troponin I (High Sensitivity): <2 ng/L (ref <18)

## 2020-09-11 IMAGING — CR DG CHEST 2V
2 series · 2 of 2 positions shown · non-contrast
Comparison: [DATE].

CLINICAL DATA: Chest pain.

EXAM:
CHEST - 2 VIEW

[w chest pa]
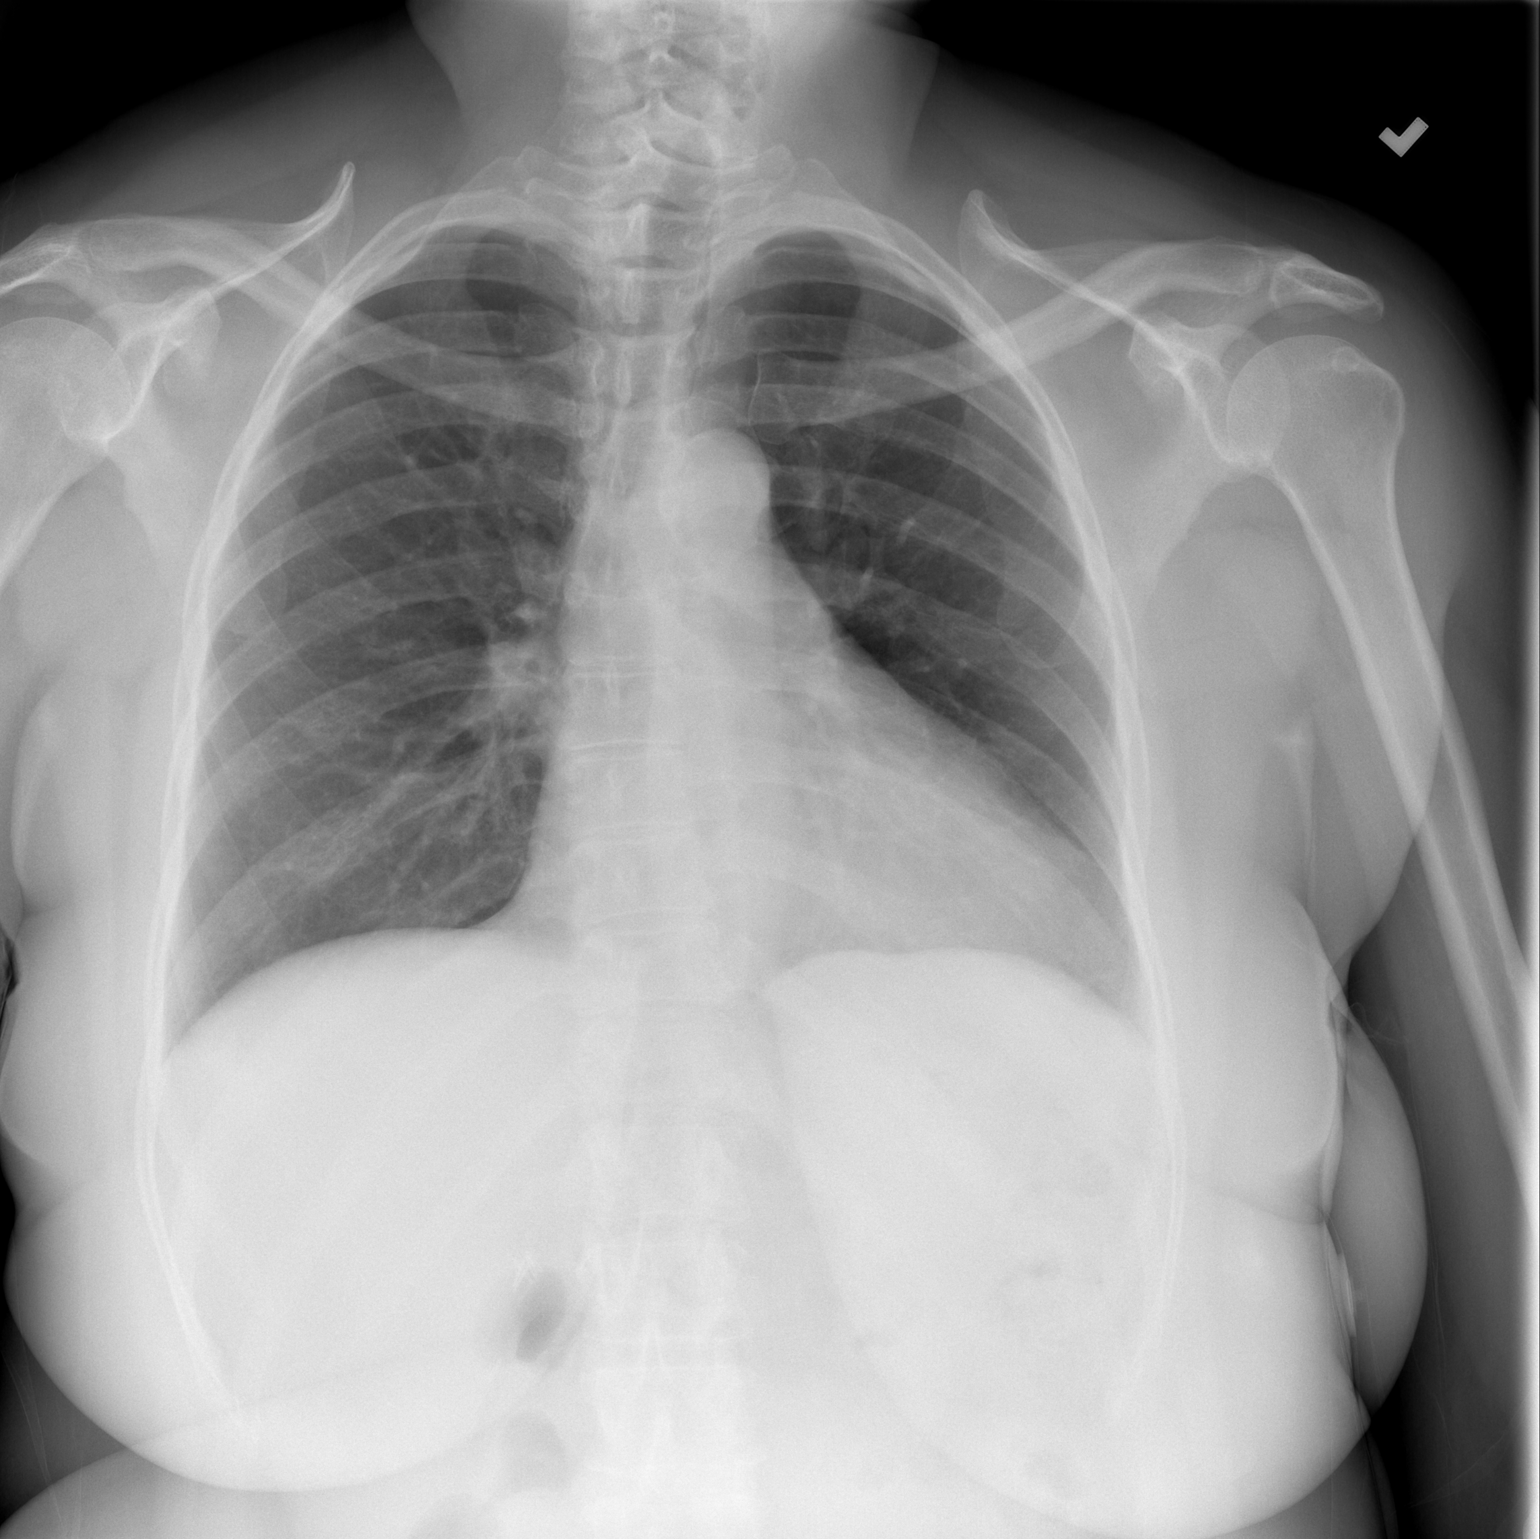

[w chest lat]
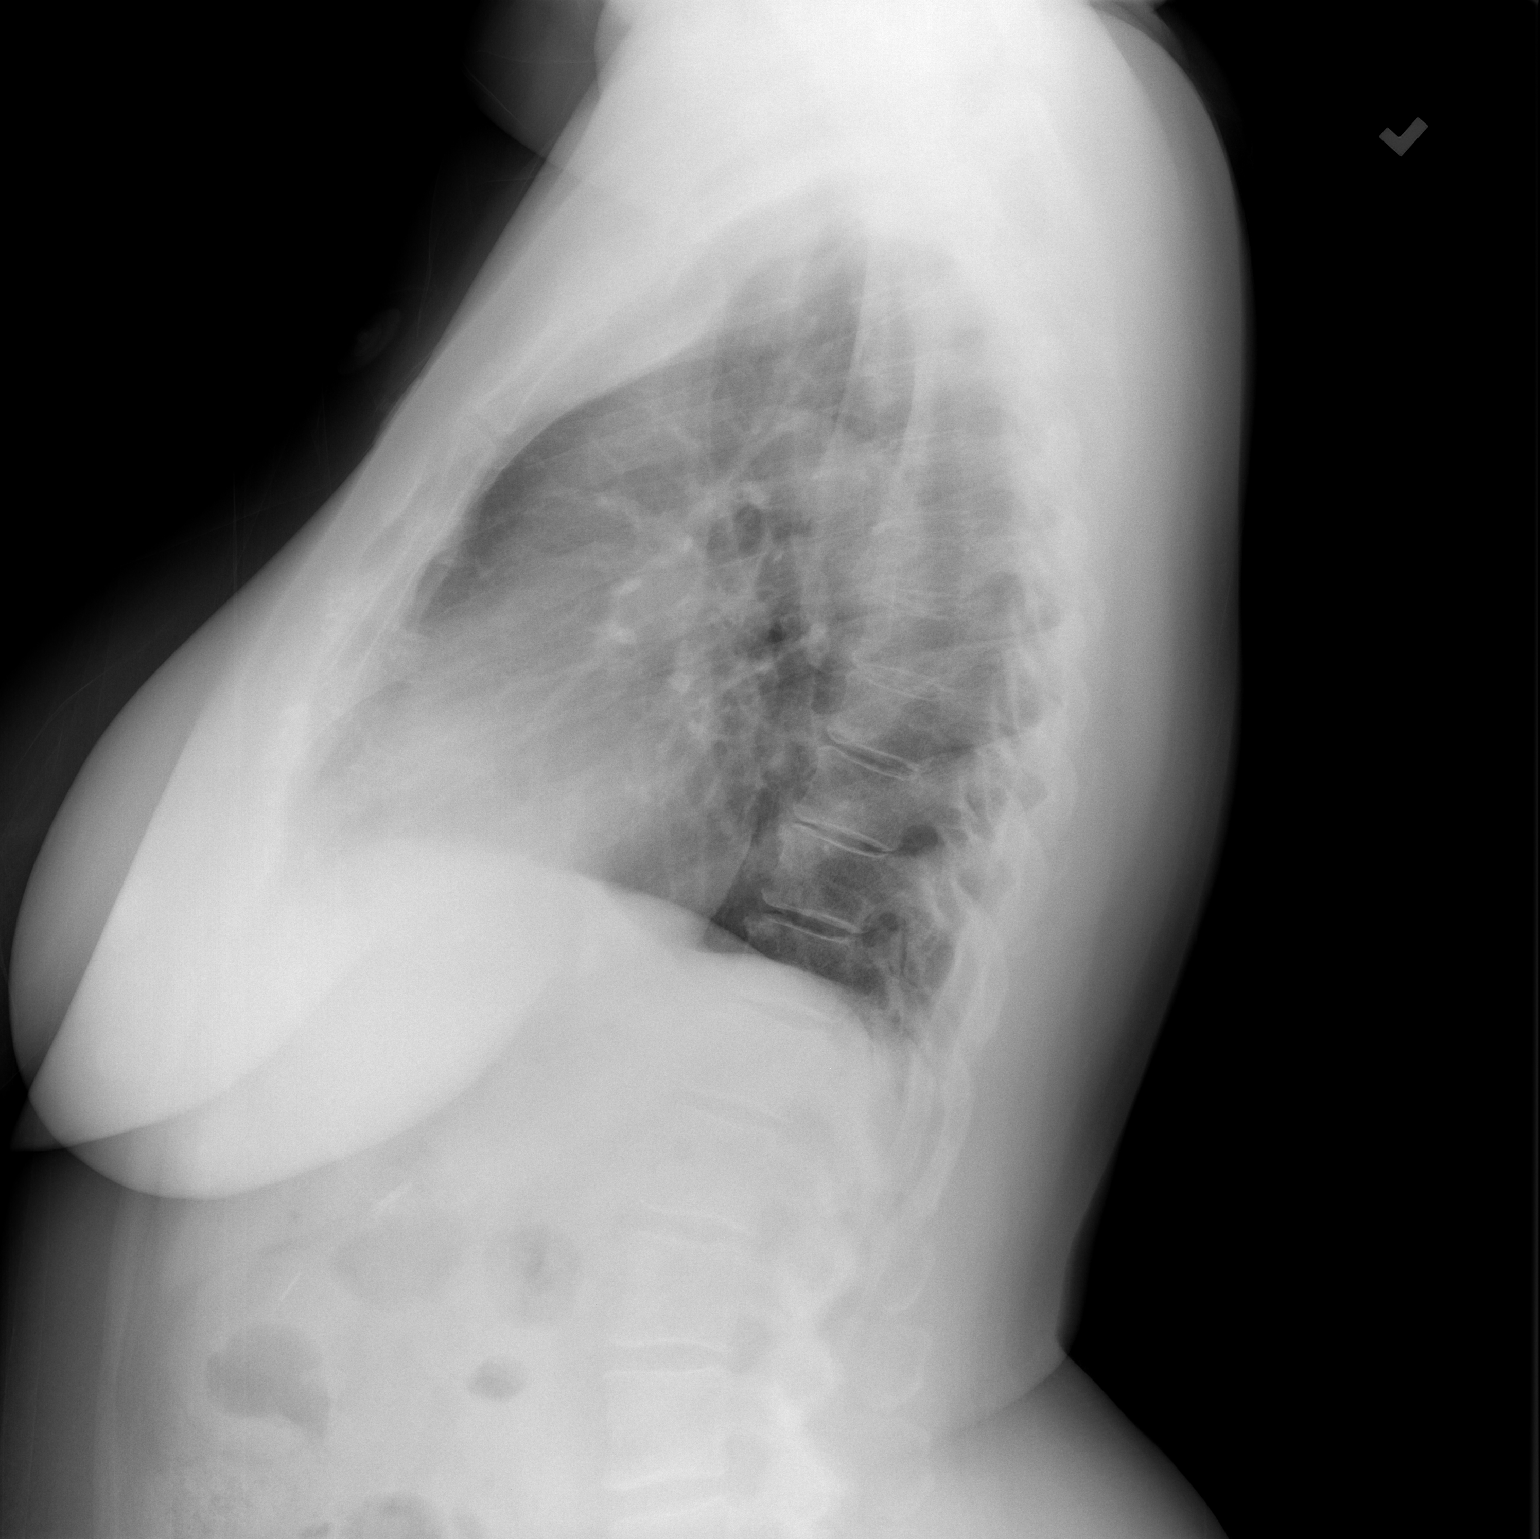

[2 of 2 positions shown; findings below may reference images not displayed]

FINDINGS: Mild enlargement the cardiac silhouette. Both lungs are clear. No
visible pleural effusions or pneumothorax. No acute osseous
abnormality. Right upper quadrant clips, likely from
cholecystectomy.
IMPRESSION: 1. No evidence of acute cardiopulmonary disease.
2. Cardiomegaly.

## 2020-09-11 IMAGING — CT CT CERVICAL SPINE W/O CM
3 of 4 series · 14 of 33 positions shown, 17 images · non-contrast
Comparison: None.

CLINICAL DATA: Left arm numbness

EXAM:
CT CERVICAL SPINE WITHOUT CONTRAST
TECHNIQUE: Multidetector CT imaging of the cervical spine was performed without
intravenous contrast. Multiplanar CT image reconstructions were also
generated.

[Series 5: coronals · coronal · 0.36mm/px · 3 of 78 slices shown]
[im 21/78  bone]
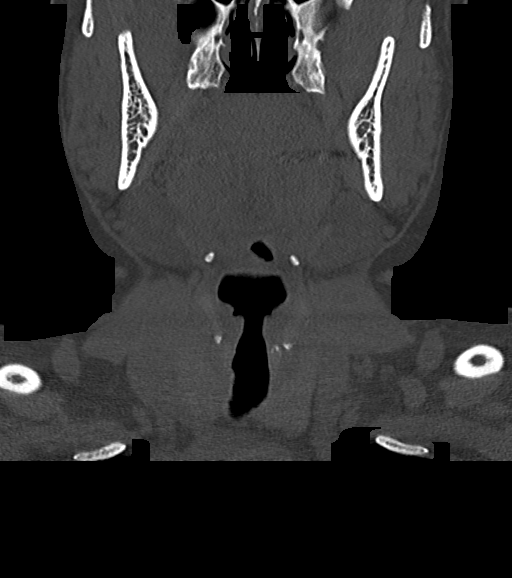
[im 33/78  bone]
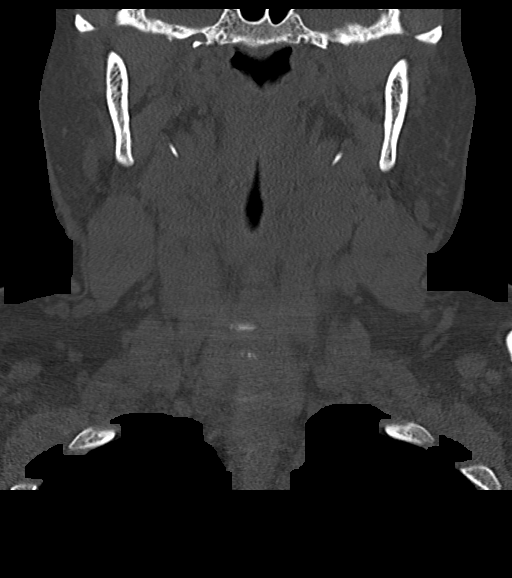
[im 45/78  bone]
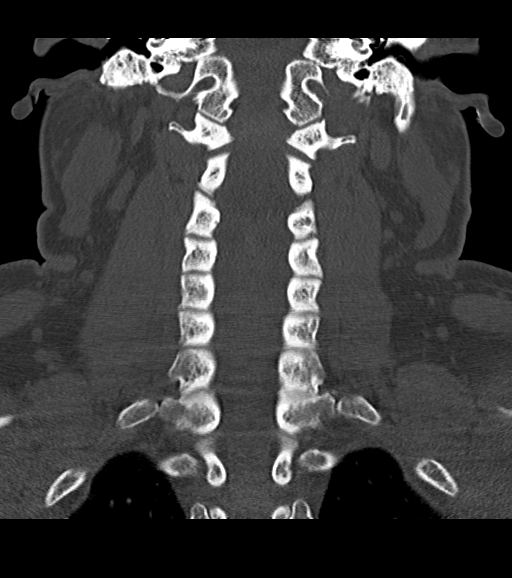

[Series 6: sagittals · sagittal · 0.34mm/px · 5 of 83 slices shown, 6 images]
[im 28/83  bone]
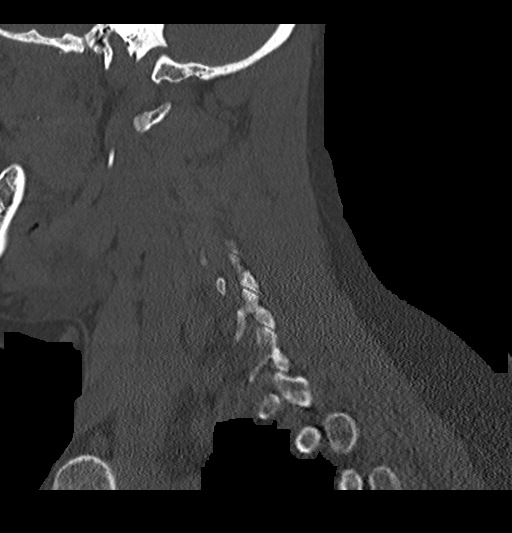
[im 35/83  bone]
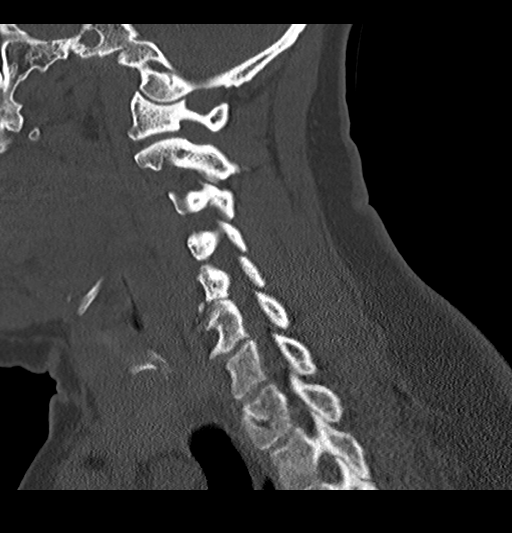
[im 42/83  soft-tissue]
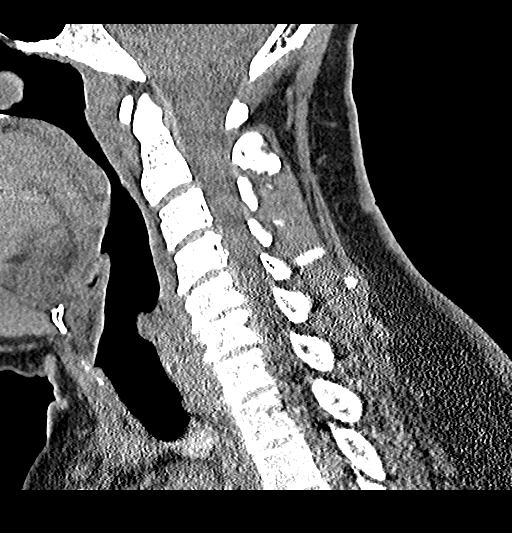
[im 42/83  bone]
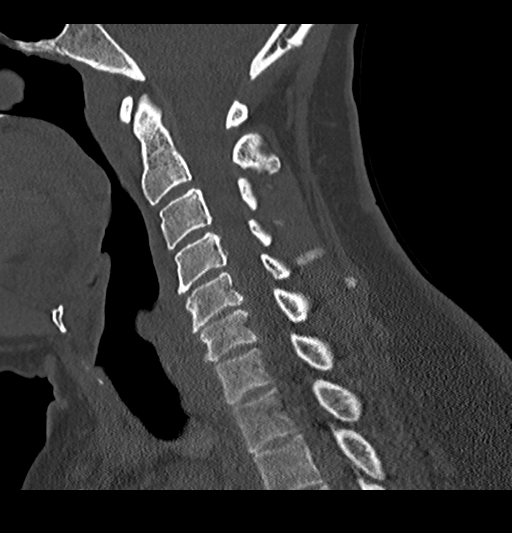
[im 48/83  bone]
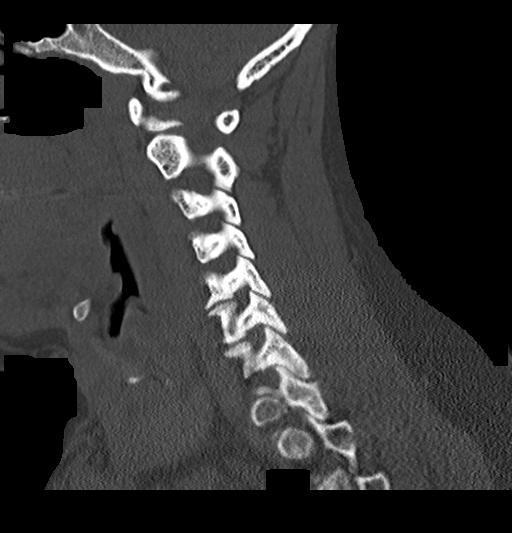
[im 55/83  bone]
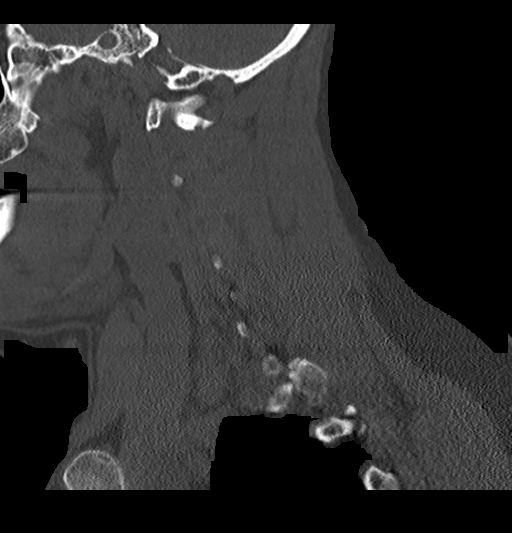

[Series 7: orthogonals · axial · 0.27mm/px · z∈[-358,-219]mm · 6 of 108 slices shown, 8 images]
[im 16/108  soft-tissue]
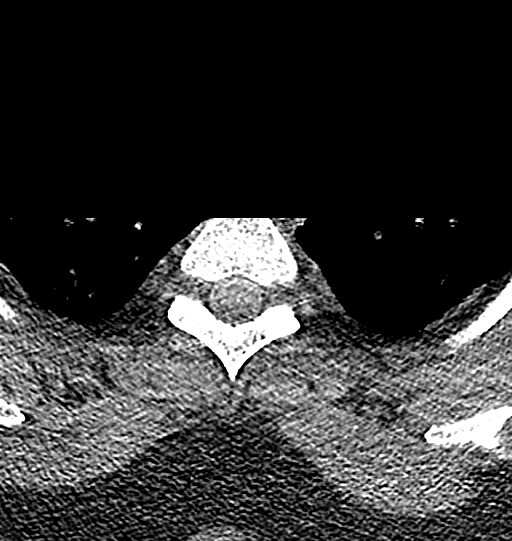
[im 16/108  bone]
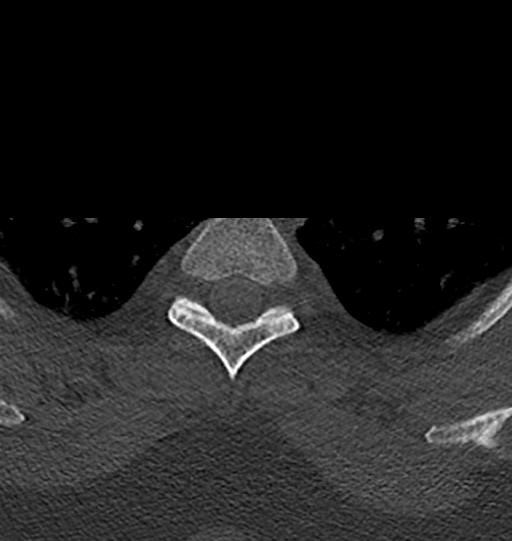
[im 31/108  bone]
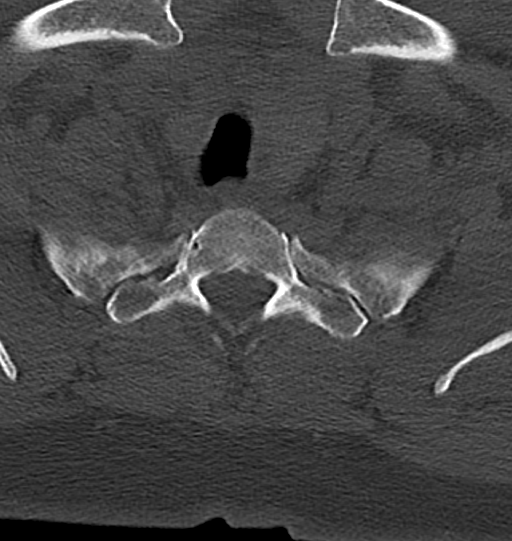
[im 46/108  bone]
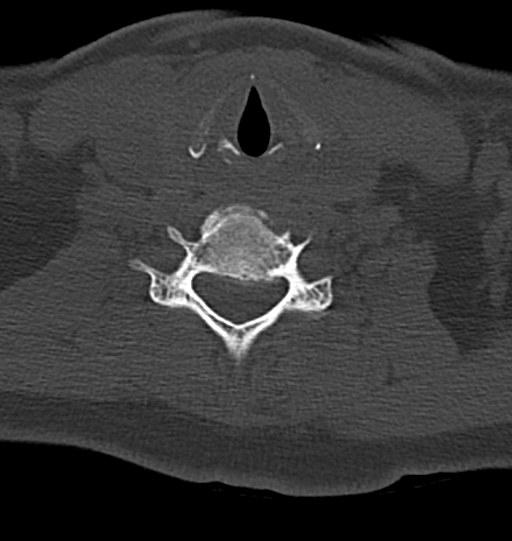
[im 62/108  bone]
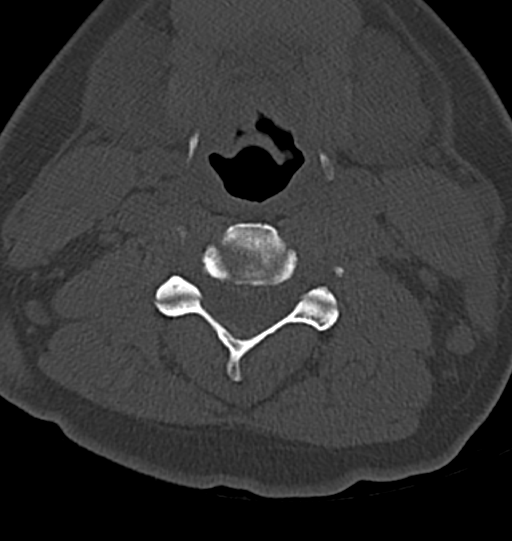
[im 77/108  soft-tissue]
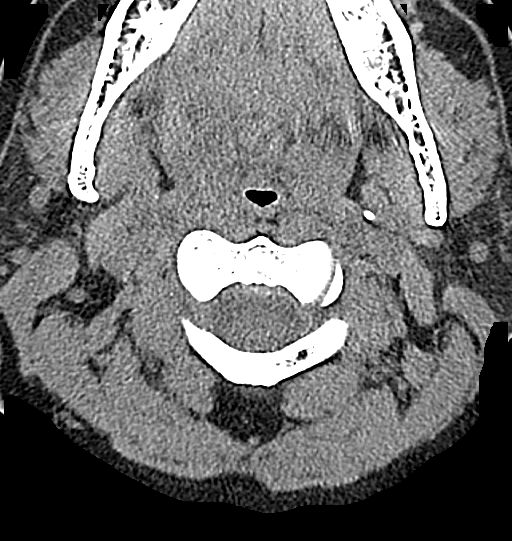
[im 77/108  bone]
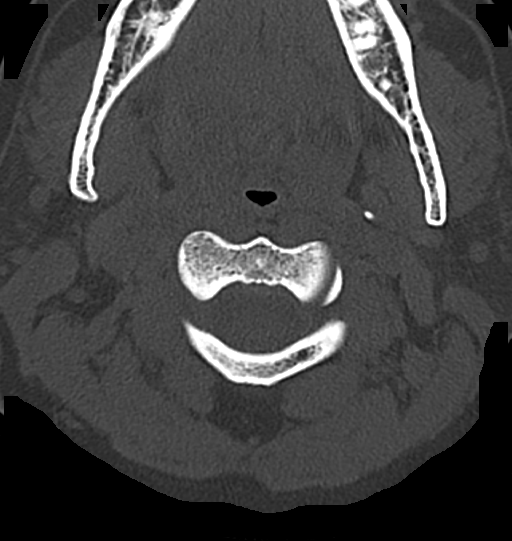
[im 92/108  bone]
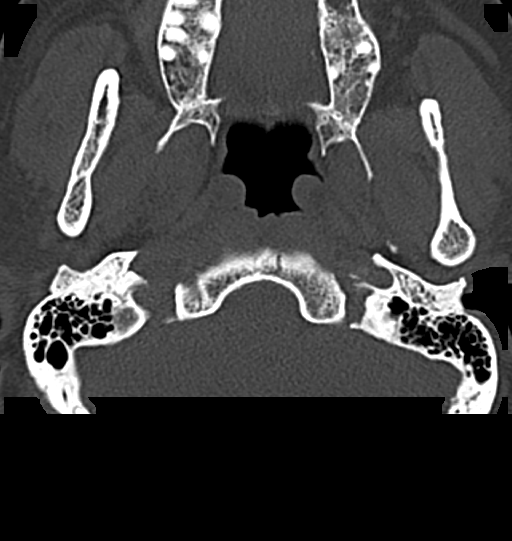

[14 of 33 positions shown; findings below may reference images not displayed]

FINDINGS: Alignment: Facet joints are aligned without dislocation or traumatic
listhesis. Dens and lateral masses are aligned. Straightening of the
cervical lordosis.

Skull base and vertebrae: No acute fracture. No primary bone lesion
or focal pathologic process.

Soft tissues and spinal canal: No prevertebral fluid or swelling. No
visible canal hematoma.

Disc levels: Mild intervertebral disc height loss of C4-5 and C5-6.
No significant facet or uncovertebral arthropathy. Small central
disc protrusion is noted at the C4-5 level. Small left paracentral
disc osteophyte complex at C5-6.

Upper chest: Included lung apices are clear.

Other: None.
IMPRESSION: 1. No acute fracture or traumatic listhesis of the cervical spine.
2. Mild degenerative disc disease at C4-5 and C5-6. Small central
disc protrusion at C4-5 and small left paracentral disc osteophyte
complex at C5-6.

## 2020-09-11 IMAGING — CT CT HEAD W/O CM
3 series · 16 of 47 positions shown, 19 images · non-contrast
Comparison: Most recent study is from [L1]

CLINICAL DATA: TIA; left arm numbness

EXAM:
CT HEAD WITHOUT CONTRAST
TECHNIQUE: Contiguous axial images were obtained from the base of the skull
through the vertex without intravenous contrast.

[Series 2: head 5.0 h30s · axial · 0.43mm/px · z∈[-190,-60]mm · 10 of 32 slices shown, 13 images]
[im 3/32  brain]
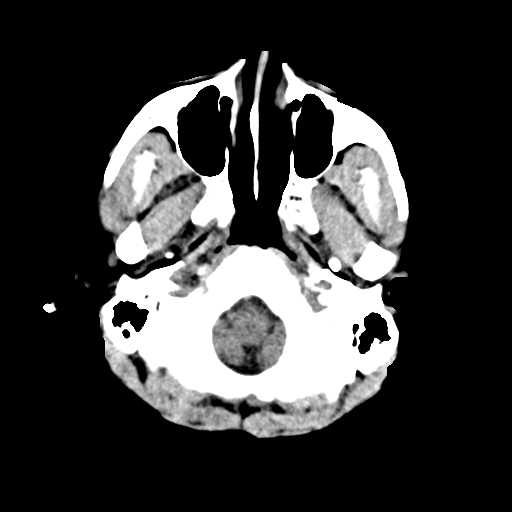
[im 3/32  bone]
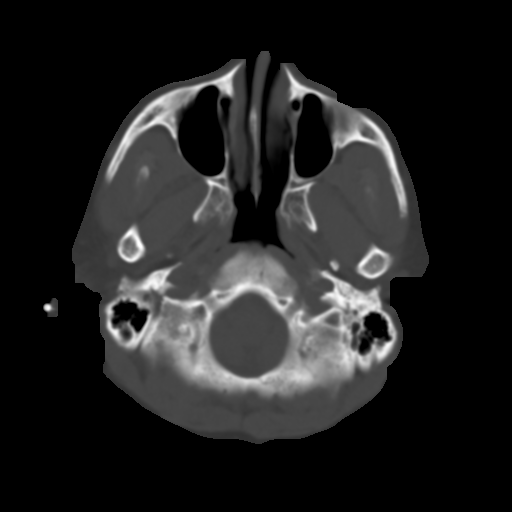
[im 6/32  brain]
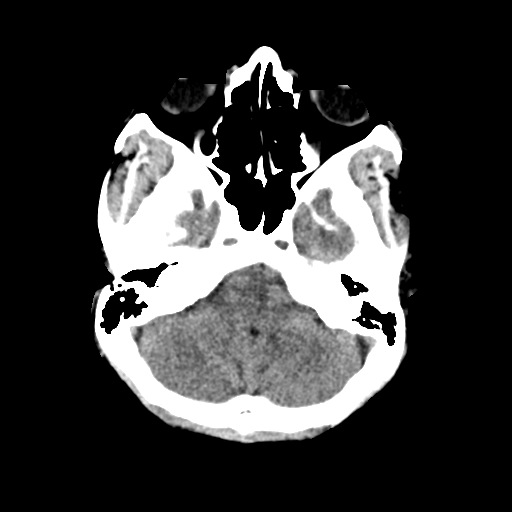
[im 9/32  brain]
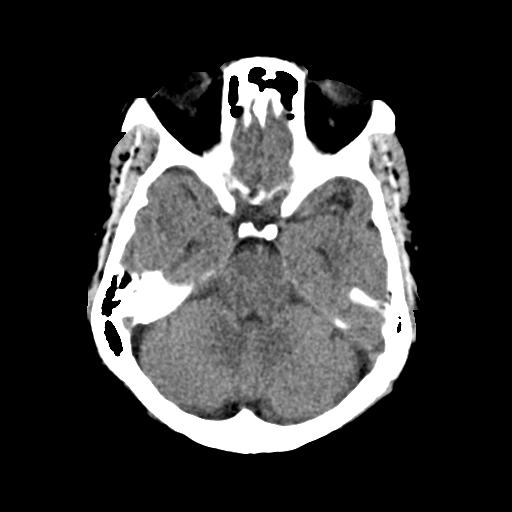
[im 11/32  brain]
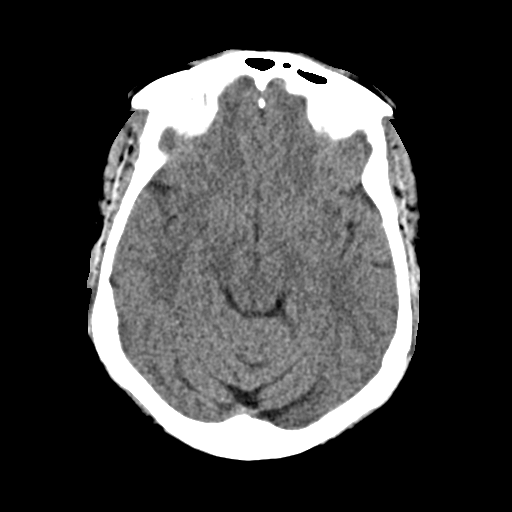
[im 14/32  brain]
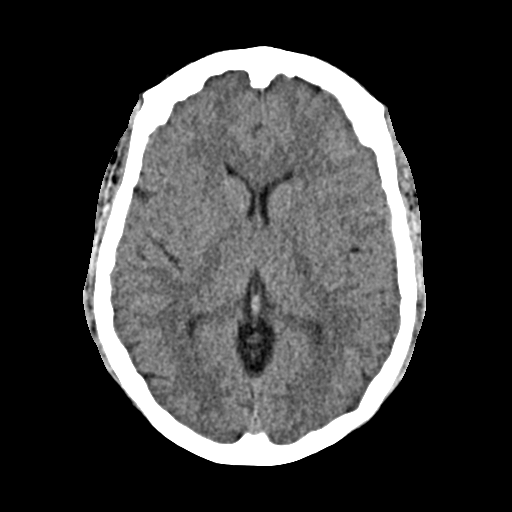
[im 14/32  bone]
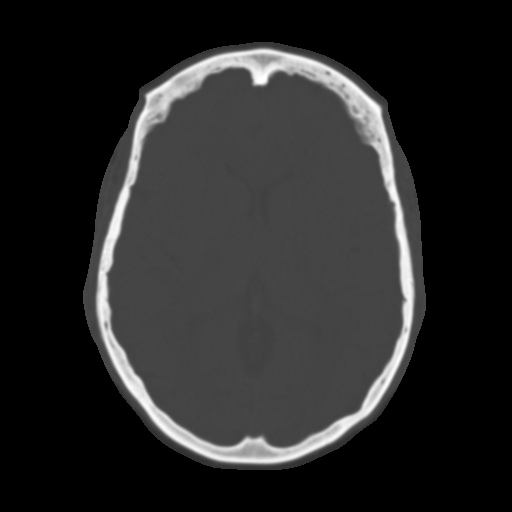
[im 18/32  brain]
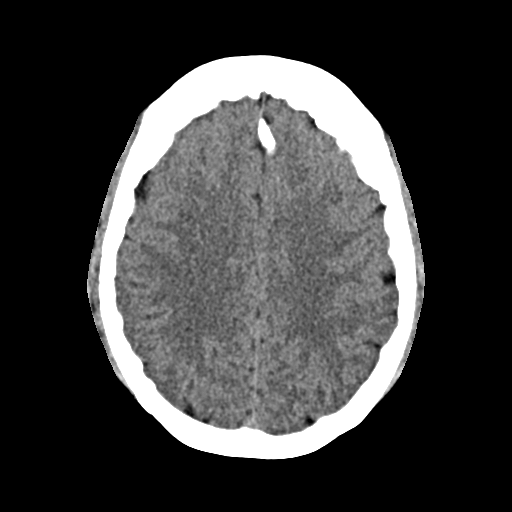
[im 21/32  brain]
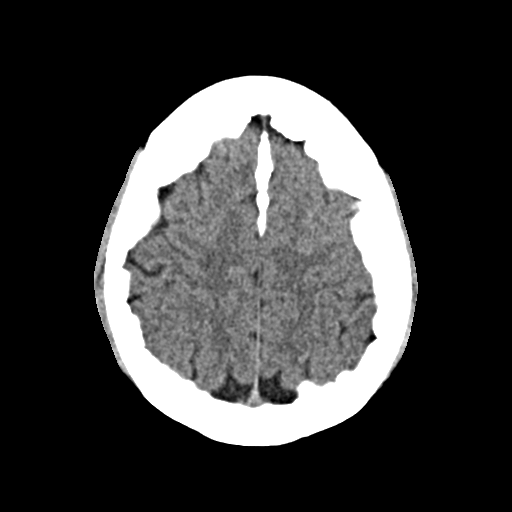
[im 24/32  brain]
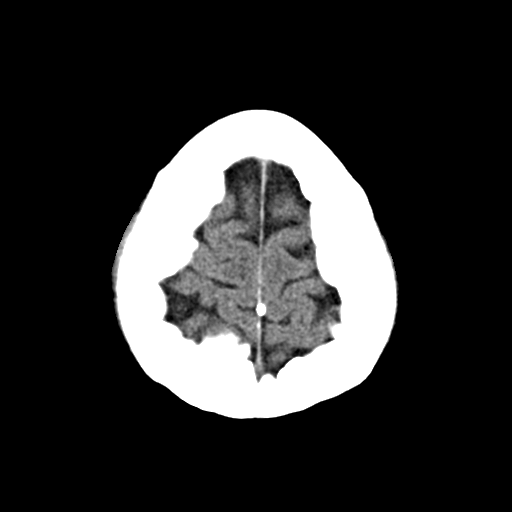
[im 26/32  brain]
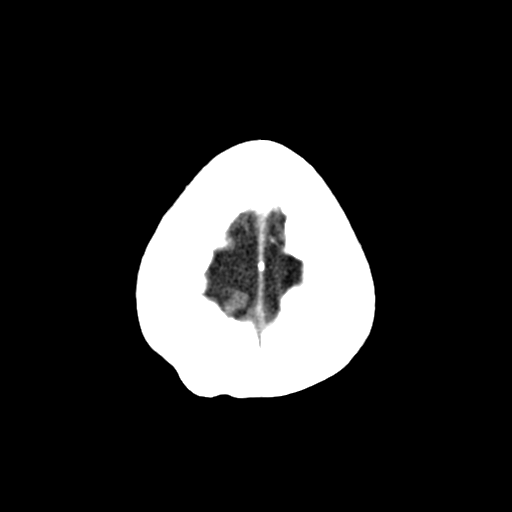
[im 26/32  bone]
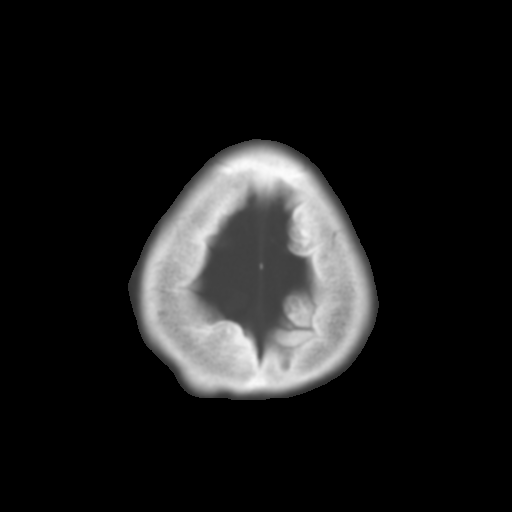
[im 29/32  brain]
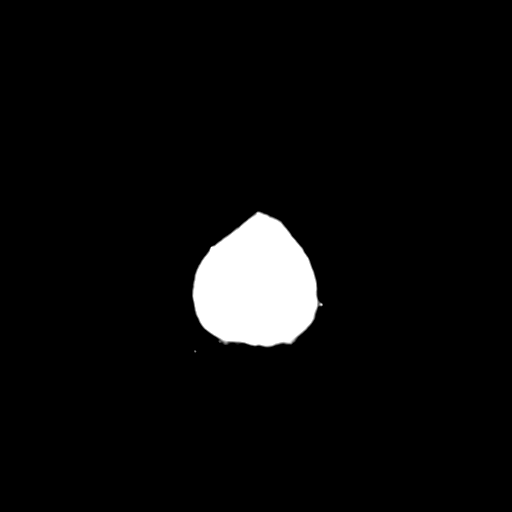

[Series 4: head 3.0 mpr cor · coronal · 0.33mm/px · 3 of 65 slices shown]
[im 22/65  brain]
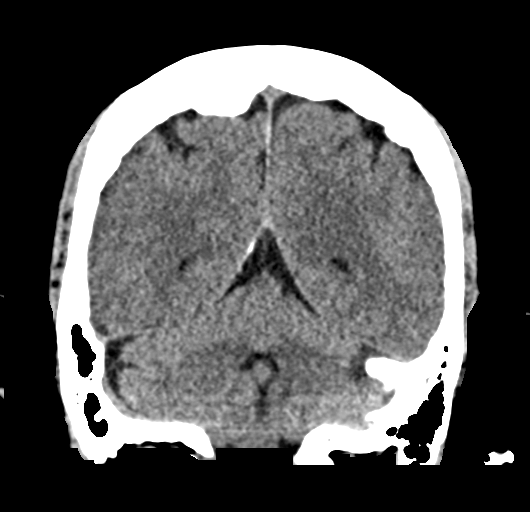
[im 29/65  brain]
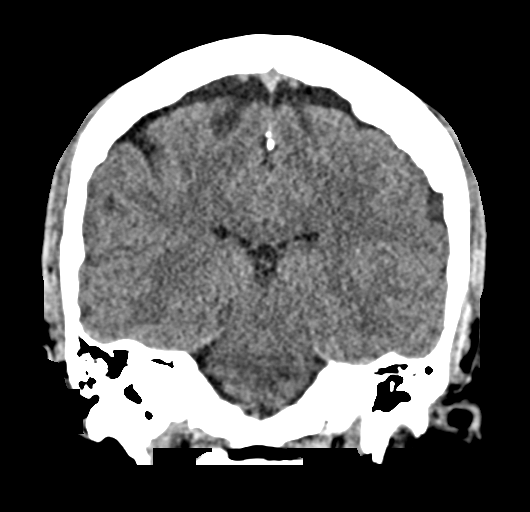
[im 36/65  brain]
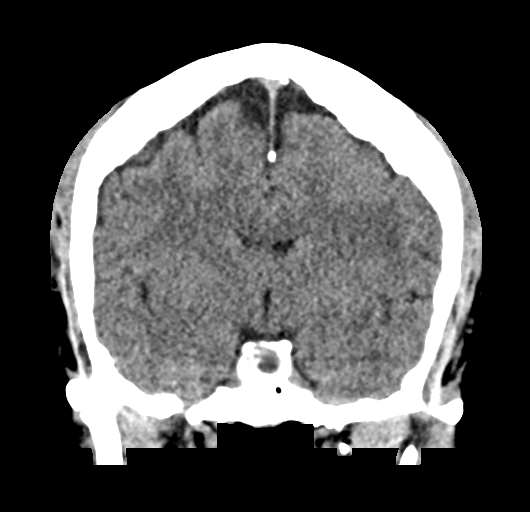

[Series 5: head 3.0 mpr sag · sagittal · 0.33mm/px · 3 of 57 slices shown]
[im 19/57  brain]
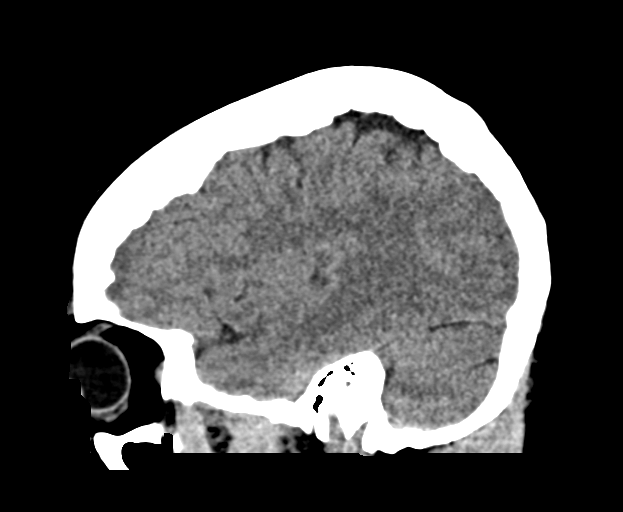
[im 29/57  brain]
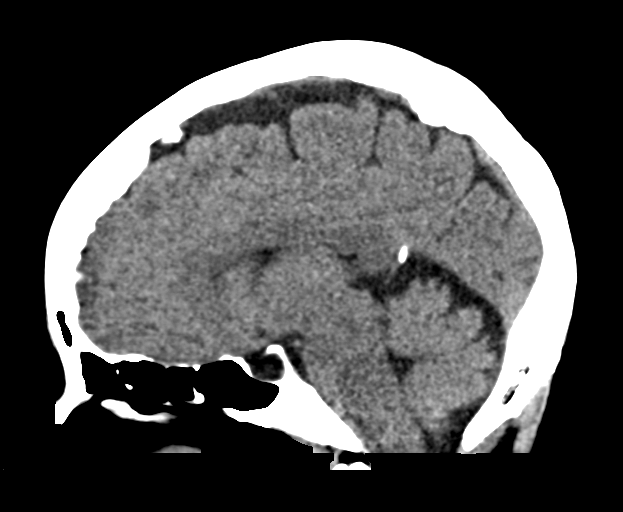
[im 38/57  brain]
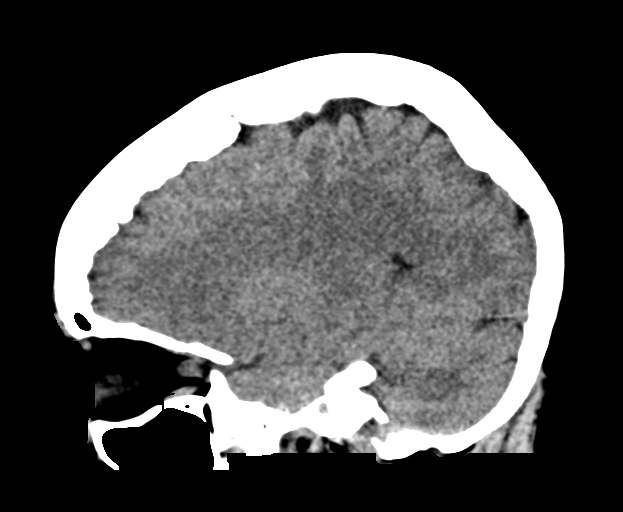

[16 of 47 positions shown; findings below may reference images not displayed]

FINDINGS: Brain: There is no acute intracranial hemorrhage, mass effect, or
edema. Gray-white differentiation is preserved. There is no
extra-axial fluid collection. Ventricles and sulci are within normal
limits in size and configuration.

Vascular: No hyperdense vessel or unexpected calcification.

Skull: Calvarium is unremarkable.

Sinuses/Orbits: No acute finding.

Other: Incidental note is made of an empty sella.

## 2020-09-11 NOTE — Discharge Instructions (Signed)
You were seen in the emergency department today with chest pain with intermittent numbness/tingling to the left arm.  Your labs do not show sign of a heart attack.  I am referring you to a cardiologist as well as a neurologist in [place].  Please monitor your symptoms closely.  If they return or become worse please return to the emergency department and/or call [phone].  If you do not have a primary care doctor I have listed some contact information on this discharge paperwork. You should call your PCP or this number to schedule a follow up appointment in the coming 1-2 weeks.

## 2020-09-11 NOTE — ED Provider Notes (Signed)
Emergency Department Provider Note   I have reviewed the triage vital signs and the nursing notes.   HISTORY  Chief Complaint Chest Pain   HPI Kathleen Foley is a 55 y.o. female with past medical history reviewed below presents to the emergency department with soreness in the left chest with numbness in the left arm.  Symptoms began this morning.  Patient states that she has had intermittent symptoms over the past 5 months. She has had too many episodes of this in the past to count.  She describes numbness from the upper arm on the left through her hand.  She states all fingers were involved.  She did not feel appreciably weak.  She denies any numbness or weakness in the leg or face.  No vision change or speech change.  No known difficulty swallowing.  She denies feeling short of breath.  No pleuritic discomfort.  No prior history of stroke or heart attack.  She does not follow with a Cardiologist or Neurologist.    History reviewed. No pertinent past medical history.  There are no problems to display for this patient.   History reviewed. No pertinent surgical history.  Allergies Patient has no known allergies.  History reviewed. No pertinent family history.  Social History Social History   Tobacco Use  . Smoking status: Never Smoker  . Smokeless tobacco: Never Used  Vaping Use  . Vaping Use: Never used  Substance Use Topics  . Alcohol use: Never  . Drug use: Never    Review of Systems  Constitutional: No fever/chills Eyes: No visual changes. ENT: No sore throat. Cardiovascular: Positive chest pain (resolved).  Respiratory: Denies shortness of breath. Gastrointestinal: No abdominal pain.  No nausea, no vomiting.  No diarrhea.  No constipation. Genitourinary: Negative for dysuria. Musculoskeletal: Negative for back pain. Skin: Negative for rash. Neurological: Negative for headaches, focal weakness. Positive left arm numbness.   10-point ROS otherwise  negative.  ____________________________________________   PHYSICAL EXAM:  VITAL SIGNS: ED Triage Vitals  Enc Vitals Group     BP 09/11/20 1041 117/79     Pulse Rate 09/11/20 1041 72     Resp 09/11/20 1041 18     Temp 09/11/20 1041 98.5 F (36.9 C)     Temp Source 09/11/20 1041 Oral     SpO2 09/11/20 1041 100 %     Weight 09/11/20 1042 186 lb (84.4 kg)     Height 09/11/20 1042 5\' 3"  (1.6 m)   Constitutional: Alert and oriented. Well appearing and in no acute distress. Eyes: Conjunctivae are normal. PERRL. EOMI. Head: Atraumatic. Nose: No congestion/rhinnorhea. Mouth/Throat: Mucous membranes are moist.  Neck: No stridor.  No cervical spine tenderness to palpation. Cardiovascular: Normal rate, regular rhythm. Good peripheral circulation. Grossly normal heart sounds.   Respiratory: Normal respiratory effort.  No retractions. Lungs CTAB. Gastrointestinal: Soft and nontender. No distention.  Musculoskeletal: No lower extremity tenderness nor edema. No gross deformities of extremities. Neurologic:  Normal speech and language. No facial asymmetry. Normal face sensation bilaterally. 5/5 strength in the upper and lower extremities. Normal sensation in the bilateral upper and lower extremities.  Skin:  Skin is warm, dry and intact. No rash noted.   ____________________________________________   LABS (all labs ordered are listed, but only abnormal results are displayed)  Labs Reviewed  BASIC METABOLIC PANEL - Abnormal; Notable for the following components:      Result Value   Creatinine, Ser 1.15 (*)    GFR, Estimated 56 (*)  All other components within normal limits  CBC - Abnormal; Notable for the following components:   RBC 3.69 (*)    Hemoglobin 11.9 (*)    All other components within normal limits  PREGNANCY, URINE  TROPONIN I (HIGH SENSITIVITY)  TROPONIN I (HIGH SENSITIVITY)   ____________________________________________  EKG   EKG  Interpretation  Date/Time:  Thursday Sep 11 2020 10:37:28 EDT Ventricular Rate:  72 PR Interval:  160 QRS Duration: 66 QT Interval:  380 QTC Calculation: 416 R Axis:   71 Text Interpretation: Normal sinus rhythm Possible Left atrial enlargement  Similar to prior Abnormal ECG Confirmed by Alona Bene [phone]) on 09/11/2020 11:11:32 AM       ____________________________________________  RADIOLOGY  CT head, c-spine, and plain films reviewed. No acute findings.  ____________________________________________   PROCEDURES  Procedure(s) performed:   Procedures  None  ____________________________________________   INITIAL IMPRESSION / ASSESSMENT AND PLAN / ED COURSE  Pertinent labs & imaging results that were available during my care of the patient were reviewed by me and considered in my medical decision making (see chart for details).   Patient presents to the emergency department with report of left-sided chest discomfort with numbness in the arm.  She feels some subjective numbness but on exam has equal sensation and strength bilaterally.  Chest pain is somewhat atypical and feels like a soreness but not pressure/tightness.  EKG is reassuring.  Troponin is pending.  Patient had a chest x-ray ordered from triage.  I have added on CT imaging of the head and cervical spine although patient not having neck pain but possible chronic changes leading to radicular symptoms. Considering TIA as well but felt much less likely with similar/frequent presentations in the past. Will follow imaging and reassess.   Imaging reviewed and reassuring. Plan for referral to Cardiology and Neurology as an outpatient with recurrent symptoms.  ____________________________________________  FINAL CLINICAL IMPRESSION(S) / ED DIAGNOSES  Final diagnoses:  Precordial chest pain  Arm paresthesia, left     Note:  This document was prepared using Dragon voice recognition software and may include  unintentional dictation errors.  Alona Bene, MD, [facility]    Chaniqua Brisby, Arlyss Repress, MD 09/13/20 [phone]

## 2020-09-11 NOTE — ED Triage Notes (Signed)
CP that radiates to L arm x 5 months that is worsening today with associated L arm numbness/tingling.  Pt reports constipation and urinary frequency.  Denies SOB today

## 2020-09-11 NOTE — Therapy (Signed)
[facility] [place] [address]  [address] [place], [place], [address] Phone: [phone]   Fax:  [phone]  Physical Therapy Evaluation  Patient Details  Name: Kathleen CoveyKathy Foley MRN: [id number] Date of Birth: [birth year] Referring Provider (PT): Albertha Gheeebecca Bassett, MD   Encounter Date: 09/11/2020   PT End of Session - 09/11/20 1214    Visit Number 1    Number of Visits 13    Date for PT Re-Evaluation 10/23/20    Authorization Type U[employer] Medicaid    PT Start Time [phone]   pt late   PT Stop Time 1013    PT Time Calculation (min) 32 min    Activity Tolerance Patient tolerated treatment well    Behavior [facility] for [facility] assessed/performed           History reviewed. No pertinent past medical history.  History reviewed. No pertinent surgical history.  Vitals:   09/11/20 0955  BP: 98/72  Pulse: 72  SpO2: 97%      Subjective Assessment - 09/11/20 0943    Subjective Patient reports LBP for over 20 years after falling down some metal steps on her back. Not sure if she sustained a particular injury from this fall. Pain has progressively worsened since. Pain is located over the B LB with radiation to above B knees. Notes intermittent N/T in the same distribution. Pain worse when sitting on the floor, laying on the L side, standing, sitting. Better when leaning on a wall. Notes issues with constipation and increased urination at night but recent B&B changes. Also notes issues with her L hip for the past year. Pain located over the posterolateral aspect. Worse with L sidelying, sitting, squatting, standing. Reports that this AM she experienced L sided chest pain as well as N/T over the L dorsal forearm to the fingers which is not normal for her. Denies SOB, difficulty swallowing, speaking, dizziness, and did not notice facial droop at the time. N/T has resolved but chest pain remains. Planning to go downstairs to the ED after this  appointment to get checked out.    Pertinent History B hearing loss,    Limitations Sitting;Lifting;Standing;Walking;House hold activities    How long can you sit comfortably? a couple hours    How long can you stand comfortably? a couple hours    How long can you walk comfortably? a couple hours    Diagnostic tests none recent    Patient Stated Goals decrease pain    Currently in Pain? Yes    Pain Score 7     Pain Location Back    Pain Orientation Right;Left;Lower    Pain Descriptors / Indicators Sharp    Pain Type Chronic pain              [facility] PT Assessment - 09/11/20 0950      Assessment   Medical Diagnosis Lumbar spondylosis/DDD    Referring Provider (PT) Albertha Gheeebecca Bassett, MD    Onset Date/Surgical Date --   >20 yrs   Next MD Visit not unsure    Prior Therapy not      Precautions   Precautions --   B hearing loss- reads lips     Balance Screen   Has the patient fallen in the past 6 months No    Has the patient had a decrease in activity level because of a fear of falling?  No    Is the patient reluctant to leave their home because  of a fear of falling?  No      Home Environment   Living Environment Private residence    Living Arrangements Alone    Available Help at Discharge Family    Type of Home House    Home Access Stairs to enter    Entrance Stairs-Number of Steps 2    Entrance Stairs-Rails None    Home Layout One level    Home Equipment None      Prior Function   Level of Independence Independent    Vocation On disability    Leisure playing with grandkids, gardening, walking      Cognition   Overall Cognitive Status Within Functional Limits for tasks assessed      Sensation   Light Touch Appears Intact      Coordination   Gross Motor Movements are Fluid and Coordinated Yes      Posture/Postural Control   Posture/Postural Control Postural limitations    Postural Limitations Rounded Shoulders;Increased lumbar lordosis      ROM / Strength    AROM / PROM / Strength AROM;Strength      AROM   AROM Assessment Site Lumbar    Lumbar Flexion mid shin   pain   Lumbar Extension mild-moderately limited   pain   Lumbar - Right Side Bend jt line    Lumbar - Left Side Bend jt line    Lumbar - Right Rotation WFL    Lumbar - Left Rotation [facility]      Strength   Strength Assessment Site Hip;Knee;Ankle    Right/Left Hip Right;Left    Right Hip Flexion 4+/5    Right Hip ABduction 4+/5    Right Hip ADduction 4/5    Left Hip Flexion 4+/5    Left Hip ABduction 4+/5    Left Hip ADduction 4/5    Right/Left Knee Right;Left    Right Knee Flexion 4+/5    Right Knee Extension 4+/5    Left Knee Flexion 4+/5    Left Knee Extension 4+/5    Right/Left Ankle Right;Left    Right Ankle Dorsiflexion 4+/5    Right Ankle Plantar Flexion 4+/5    Left Ankle Dorsiflexion 4+/5    Left Ankle Plantar Flexion 4+/5      Palpation   Palpation comment TTP in B piriformis and L proximal HS attachment; soft tissue restriction in B buttocks and lumbar paraspinals      Ambulation/Gait   Assistive device None    Gait Pattern Step-through pattern;Lateral hip instability    Ambulation Surface Level;Indoor    Gait velocity WFL                      Objective measurements completed on examination: See above findings.               PT Education - 09/11/20 1213    Education Details prognosis, POC, HEP    Person(s) Educated Patient    Methods Explanation;Demonstration;Tactile cues;Verbal cues;Handout    Comprehension Verbalized understanding;Returned demonstration            PT Short Term Goals - 09/11/20 1224      PT SHORT TERM GOAL #1   Title Patient to be independent with initial HEP.    Time 3    Period Weeks    Status New    Target Date 10/02/20             PT Long Term Goals - 09/11/20 1225  PT LONG TERM GOAL #1   Title Patient to be independent with advanced HEP.    Time 6    Period Weeks    Status New     Target Date 10/23/20      PT LONG TERM GOAL #2   Title Patient to demonstrate lumbar AROM WFL and without pain limiting.    Time 6    Period Weeks    Status New    Target Date 10/23/20      PT LONG TERM GOAL #3   Title Patient to report 70% improved tolerance for bed mobility and positioning.    Time 6    Period Weeks    Status New    Target Date 10/23/20      PT LONG TERM GOAL #4   Title Patient to report 70% improvement in tolerance for household tasks d/t decreased pain.    Time 6    Period Weeks    Status New    Target Date 10/23/20                  Plan - 09/11/20 1214    Clinical Impression Statement Patient is a 55 y/o F presenting to OPPT with c/o chronic LBP and L hip pain for the past 20 years and 1 year, respectively. LBP occurs across B sides with radiation of pain and N/T to above B knees. Denies recent B&B changes. Pain worse with sitting on the floor, L sidelying, standing, sitting. L hip pain occurs over the L posterolateral aspect, and worse with the same activities. Patient reporting new c/o L sided chest pain as well as N/T over the L dorsal forearm to the fingers this AM. Denies SOB, difficulty swallowing, speaking, dizziness, and did not notice facial droop at the time. N/T has resolved but chest pain remains. Noting that she is planning to go downstairs to the ED after this appointment to get checked out. Patient today presenting with rounded shoulders and increased lumbar lordosis, painful and limited lumbar flexion/extension, B hip adduction weakness, TTP in B piriformis and L proximal HS attachment, soft tissue restriction in B buttocks and lumbar paraspinals, and gait deviations. Patient's vitals appeared grossly WNL and patient declined offer to end session early for further evaluation in ED. Patient was educated on gentle stretching and mobility HEP- patient reported understanding. Would benefit from skilled PT services 2x/week for 6 weeks to address  aforementioned impairments.    Personal Factors and Comorbidities Age;Comorbidity 1;Fitness;Past/Current Experience;Time since onset of injury/illness/exacerbation    Comorbidities B hearing loss    Examination-Activity Limitations Bathing;Sit;Sleep;Bed Mobility;Bend;Squat;Stairs;Caring for Others;Carry;Stand;Toileting;Dressing;Transfers;Hygiene/Grooming;Lift;Locomotion Level    Examination-Participation Restrictions Meal Prep;Laundry;Driving;Community Activity;Shop;Cleaning;School;Church    Stability/Clinical Decision Making Stable/Uncomplicated    Clinical Decision Making Low    Rehab Potential Good    PT Frequency 2x / week    PT Duration 6 weeks    PT Treatment/Interventions ADLs/Self Care Home Management;Cryotherapy;Electrical Stimulation;Iontophoresis 4mg /ml Dexamethasone;Moist Heat;Balance training;Therapeutic exercise;Therapeutic activities;Functional mobility training;Stair training;Gait training;Ultrasound;Neuromuscular re-education;Patient/family education;Manual techniques;Taping;Energy conservation;Dry needling;Passive range of motion    PT Next Visit Plan reassess HEP; assess LE flexibility; progress lumbopelvic mobility and core strengthening    Consulted and Agree with Plan of Care Patient           Patient will benefit from skilled therapeutic intervention in order to improve the following deficits and impairments:  Decreased activity tolerance,Decreased strength,Increased fascial restricitons,Pain,Decreased balance,Difficulty walking,Increased muscle spasms,Improper body mechanics,Decreased range of motion,Impaired flexibility,Postural dysfunction  Visit Diagnosis: Chronic bilateral low back  pain with bilateral sciatica  Pain in left hip  Other muscle spasm  Difficulty in walking, not elsewhere classified     Problem List There are no problems to display for this patient.    Anette Guarneri, PT, DPT 09/11/20 12:28 PM   [facility] [address]  [address] [place], [place], [address] Phone: [phone]   Fax:  [phone]  Name: Kathleen Foley MRN: [id number] Date of Birth: 1965-06-26

## 2020-09-23 ENCOUNTER — Ambulatory Visit: Payer: Medicaid Other | Attending: Sports Medicine

## 2020-09-23 DIAGNOSIS — G8929 Other chronic pain: Secondary | ICD-10-CM | POA: Insufficient documentation

## 2020-09-23 DIAGNOSIS — M25552 Pain in left hip: Secondary | ICD-10-CM | POA: Insufficient documentation

## 2020-09-23 DIAGNOSIS — M62838 Other muscle spasm: Secondary | ICD-10-CM | POA: Insufficient documentation

## 2020-09-23 DIAGNOSIS — R262 Difficulty in walking, not elsewhere classified: Secondary | ICD-10-CM | POA: Insufficient documentation

## 2020-09-23 DIAGNOSIS — M5441 Lumbago with sciatica, right side: Secondary | ICD-10-CM | POA: Insufficient documentation

## 2020-09-23 DIAGNOSIS — M5442 Lumbago with sciatica, left side: Secondary | ICD-10-CM | POA: Insufficient documentation

## 2020-09-30 ENCOUNTER — Ambulatory Visit: Payer: Medicaid Other

## 2020-09-30 ENCOUNTER — Other Ambulatory Visit: Payer: Self-pay

## 2020-09-30 DIAGNOSIS — M5441 Lumbago with sciatica, right side: Secondary | ICD-10-CM | POA: Diagnosis present

## 2020-09-30 DIAGNOSIS — M5442 Lumbago with sciatica, left side: Secondary | ICD-10-CM | POA: Diagnosis not present

## 2020-09-30 DIAGNOSIS — G8929 Other chronic pain: Secondary | ICD-10-CM

## 2020-09-30 DIAGNOSIS — M25552 Pain in left hip: Secondary | ICD-10-CM | POA: Diagnosis present

## 2020-09-30 DIAGNOSIS — R262 Difficulty in walking, not elsewhere classified: Secondary | ICD-10-CM

## 2020-09-30 DIAGNOSIS — M62838 Other muscle spasm: Secondary | ICD-10-CM | POA: Diagnosis present

## 2020-09-30 NOTE — Therapy (Signed)
[facility] [address]  [address] [place], [place], [address] Phone: [phone]   Fax:  [phone]  Physical Therapy Treatment  Patient Details  Name: Kathleen Foley MRN: [id number] Date of Birth: 07-Jul-1965 Referring Provider (PT): Albertha Ghee, MD   Encounter Date: 09/30/2020   PT End of Session - 09/30/20 1126     Visit Number 2    Number of Visits 13    Date for PT Re-Evaluation 10/23/20    Authorization Type UHC Medicaid    PT Start Time 1041    PT Stop Time 1126    PT Time Calculation (min) 45 min    Activity Tolerance Patient tolerated treatment well;Patient limited by fatigue    Behavior During Therapy [facility] for tasks assessed/performed             History reviewed. No pertinent past medical history.  History reviewed. No pertinent surgical history.  There were no vitals filed for this visit.   Subjective Assessment - 09/30/20 1047     Subjective Pt reports still having chest pain on the L side, only stays in one spot.    Pertinent History B hearing loss,    Diagnostic tests none recent    Patient Stated Goals decrease pain    Currently in Pain? Yes    Pain Score 5     Pain Location Back    Pain Orientation Right;Left    Pain Descriptors / Indicators Aching    Pain Type Acute pain    Multiple Pain Sites Yes    Pain Score 5    Pain Location Hip    Pain Orientation Left    Pain Descriptors / Indicators Constant    Pain Type Chronic pain                               [facility] Adult PT Treatment/Exercise - 09/30/20 0001       Exercises   Exercises Lumbar;Knee/Hip      Lumbar Exercises: Stretches   Active Hamstring Stretch Right;Left;2 reps;30 seconds    Active Hamstring Stretch Limitations seated    Pelvic Tilt 10 reps    Pelvic Tilt Limitations 3 sec hold    Piriformis Stretch Right;Left;30 seconds    Piriformis Stretch Limitations supine      Lumbar Exercises:  Aerobic   Nustep L2x37min      Lumbar Exercises: Standing   Row Strengthening;Both;10 reps;Theraband    Theraband Level (Row) Level 1 (Yellow)      Lumbar Exercises: Seated   Long Arc Quad on Chair Strengthening;Both;10 reps;Weights    LAQ on Chair Weights (lbs) 2    Other Seated Lumbar Exercises rows with yellow resistance band 10 reps; cues to depress shoulders      Lumbar Exercises: Supine   Bridge 10 reps;2 seconds    Bridge Limitations cues for technique      Knee/Hip Exercises: Seated   Marching Strengthening;Both;10 reps;Weights    Marching Limitations 2#                      PT Short Term Goals - 09/30/20 1138       PT SHORT TERM GOAL #1   Title Patient to be independent with initial HEP.    Time 3    Period Weeks    Status On-going   pt notes that she has not tried any exercises yet  Target Date 10/02/20               PT Long Term Goals - 09/30/20 1138       PT LONG TERM GOAL #1   Title Patient to be independent with advanced HEP.    Time 6    Period Weeks    Status On-going      PT LONG TERM GOAL #2   Title Patient to demonstrate lumbar AROM WFL and without pain limiting.    Time 6    Period Weeks    Status On-going      PT LONG TERM GOAL #3   Title Patient to report 70% improved tolerance for bed mobility and positioning.    Time 6    Period Weeks    Status On-going      PT LONG TERM GOAL #4   Title Patient to report 70% improvement in tolerance for household tasks d/t decreased pain.    Time 6    Period Weeks    Status On-going                   Plan - 09/30/20 1140     Clinical Impression Statement Pt had complaints during session of fatigue with the LE exercises d/t lack of muscular endurance. Pt has B hearing loss, so there are delays in understanding exercise instruction d/t this. She report that she has not been compliant with the initial HEP and requires mod instruction with the exercises. She did have some  localized chest pain prior to this visit but did not report any symptoms associated with stroke or TIA. Cues required to depress shoulders during the rows today and instruction with every exercises to correctly perform. She demonstrates moderate tightness in the hamstrings today with stretches. Overall she responded well.    Personal Factors and Comorbidities Age;Comorbidity 1;Fitness;Past/Current Experience;Time since onset of injury/illness/exacerbation    Comorbidities B hearing loss    PT Frequency 2x / week    PT Duration 6 weeks    PT Treatment/Interventions ADLs/Self Care Home Management;Cryotherapy;Electrical Stimulation;Iontophoresis 4mg /ml Dexamethasone;Moist Heat;Balance training;Therapeutic exercise;Therapeutic activities;Functional mobility training;Stair training;Gait training;Ultrasound;Neuromuscular re-education;Patient/family education;Manual techniques;Taping;Energy conservation;Dry needling;Passive range of motion    PT Next Visit Plan assess LE flexibility; progress lumbopelvic mobility and core strengthening    Consulted and Agree with Plan of Care Patient             Patient will benefit from skilled therapeutic intervention in order to improve the following deficits and impairments:  Decreased activity tolerance, Decreased strength, Increased fascial restricitons, Pain, Decreased balance, Difficulty walking, Increased muscle spasms, Improper body mechanics, Decreased range of motion, Impaired flexibility, Postural dysfunction  Visit Diagnosis: Chronic bilateral low back pain with bilateral sciatica  Pain in left hip  Other muscle spasm  Difficulty in walking, not elsewhere classified     Problem List There are no problems to display for this patient.   , PTA 09/30/2020, 11:51 AM  [facility] [address]  [address] [place], [place], [place] Phone: [phone]   Fax:   [phone]  Name: Kippy Melena MRN: Kristian Covey Date of Birth: 09-20-1965

## 2020-10-07 ENCOUNTER — Ambulatory Visit: Payer: Medicaid Other | Admitting: Physical Therapy

## 2020-10-07 ENCOUNTER — Encounter: Payer: Self-pay | Admitting: Physical Therapy

## 2020-10-07 ENCOUNTER — Other Ambulatory Visit: Payer: Self-pay

## 2020-10-07 VITALS — HR 85

## 2020-10-07 DIAGNOSIS — M25552 Pain in left hip: Secondary | ICD-10-CM

## 2020-10-07 DIAGNOSIS — M62838 Other muscle spasm: Secondary | ICD-10-CM

## 2020-10-07 DIAGNOSIS — M5442 Lumbago with sciatica, left side: Secondary | ICD-10-CM | POA: Diagnosis not present

## 2020-10-07 DIAGNOSIS — G8929 Other chronic pain: Secondary | ICD-10-CM

## 2020-10-07 DIAGNOSIS — R262 Difficulty in walking, not elsewhere classified: Secondary | ICD-10-CM

## 2020-10-07 NOTE — Therapy (Signed)
[facility] [address]  [address] [place], [place], [address] Phone: [phone]   Fax:  [phone]  Physical Therapy Treatment  Patient Details  Name: Kathleen Foley MRN: [id number] Date of Birth: 03/21/1966 Referring Provider (PT): Albertha Ghee, MD   Encounter Date: 10/07/2020   PT End of Session - 10/07/20 0928     Visit Number 3    Number of Visits 13    Date for PT Re-Evaluation 10/23/20    Authorization Type UHC Medicaid    PT Start Time 0846    PT Stop Time 0927    PT Time Calculation (min) 41 min    Activity Tolerance Patient tolerated treatment well    Behavior During Therapy [facility] for tasks assessed/performed             History reviewed. No pertinent past medical history.  History reviewed. No pertinent surgical history.  Vitals:   10/07/20 0855  Pulse: 85  SpO2: 99%     Subjective Assessment - 10/07/20 0847     Subjective Hip and back have been hurting about the same since last session. Has been having chest pain off and on, but denies any currently. Denies questions on HEP but reports that she has not been performing them d/t pain.    Pertinent History B hearing loss,    Diagnostic tests none recent    Patient Stated Goals decrease pain    Currently in Pain? Yes    Pain Score 7     Pain Location Back    Pain Orientation Right;Left;Lower    Pain Descriptors / Indicators Aching    Pain Type Acute pain    Pain Score 7    Pain Location Hip    Pain Orientation Left    Pain Descriptors / Indicators Constant    Pain Type Chronic pain                [facility] PT Assessment - 10/07/20 0001       AROM   AROM Assessment Site Hip    Right/Left Hip Right;Left    Right Hip Flexion 125    Right Hip External Rotation  40    Right Hip Internal Rotation  25    Left Hip Flexion 104    Left Hip External Rotation  31    Left Hip Internal Rotation  44                            [facility] Adult PT Treatment/Exercise - 10/07/20 0001       Lumbar Exercises: Stretches   Passive Hamstring Stretch Right;Left;1 rep;30 seconds    Passive Hamstring Stretch Limitations supine with strap   heavy instruction and demonstration required for form and avoiding pushing to pain   Pelvic Tilt 10 reps    Pelvic Tilt Limitations 3 sec hold      Lumbar Exercises: Aerobic   Recumbent Bike L3 x 6 min      Lumbar Exercises: Supine   Bridge 10 reps;2 seconds    Bridge Limitations good form    Bridge with clamshell 10 reps   red TB     Lumbar Exercises: Sidelying   Other Sidelying Lumbar Exercises R/L open book stretch 10x each   cues to avoid pushing into pain     Knee/Hip Exercises: Stretches   Piriformis Stretch Right;Left;2 reps;30 seconds    Piriformis Stretch Limitations fig 4, KTOS  in supine   cues to perform to tolerance     Knee/Hip Exercises: Sidelying   Clams 2x10 yellow TB   cues to avoid posterior trunk roll                   PT Education - 10/07/20 0924     Education Details administered red TB for bridges; update to HEP    Person(s) Educated Patient    Methods Explanation;Demonstration;Tactile cues;Verbal cues;Handout    Comprehension Verbalized understanding;Returned demonstration              PT Short Term Goals - 09/30/20 1138       PT SHORT TERM GOAL #1   Title Patient to be independent with initial HEP.    Time 3    Period Weeks    Status On-going   pt notes that she has not tried any exercises yet   Target Date 10/02/20               PT Long Term Goals - 09/30/20 1138       PT LONG TERM GOAL #1   Title Patient to be independent with advanced HEP.    Time 6    Period Weeks    Status On-going      PT LONG TERM GOAL #2   Title Patient to demonstrate lumbar AROM WFL and without pain limiting.    Time 6    Period Weeks    Status On-going      PT LONG TERM GOAL #3   Title Patient to report  70% improved tolerance for bed mobility and positioning.    Time 6    Period Weeks    Status On-going      PT LONG TERM GOAL #4   Title Patient to report 70% improvement in tolerance for household tasks d/t decreased pain.    Time 6    Period Weeks    Status On-going                   Plan - 10/07/20 0929     Clinical Impression Statement Patient arrived to session with report of continued LBP and noncompliance with her HEP. Educated patient on importance of HEP compliance, especially when experiencing pain and to perform HEP with less intensity/amplitude if pain occurs- patient reported understanding. Patient reported some SOB after warm up which was resolved with sitting rest break- vitals remained stable. Proceeded with session while monitoring symptoms. Hip AROM demonstrated more limited mobility on L vs. R side, however nonpainful today. Proceeded with hip stretching with cueing to avoid pushing into pain. Frequent cueing was required today to avoid pushing into painful ranges of motion. Patient did demonstrate good form and ROM with bridges today, thus was able to increase challenge with bridges and other glute strengthening ther-ex today. Patient reported understanding of HEP update and reported decrease in pain to 5/10 at end of session.    Personal Factors and Comorbidities Age;Comorbidity 1;Fitness;Past/Current Experience;Time since onset of injury/illness/exacerbation    Comorbidities B hearing loss    PT Frequency 2x / week    PT Duration 6 weeks    PT Treatment/Interventions ADLs/Self Care Home Management;Cryotherapy;Electrical Stimulation;Iontophoresis 4mg /ml Dexamethasone;Moist Heat;Balance training;Therapeutic exercise;Therapeutic activities;Functional mobility training;Stair training;Gait training;Ultrasound;Neuromuscular re-education;Patient/family education;Manual techniques;Taping;Energy conservation;Dry needling;Passive range of motion    PT Next Visit Plan assess  LE flexibility; progress lumbopelvic mobility and core strengthening    Consulted and Agree with Plan of Care Patient  Patient will benefit from skilled therapeutic intervention in order to improve the following deficits and impairments:  Decreased activity tolerance, Decreased strength, Increased fascial restricitons, Pain, Decreased balance, Difficulty walking, Increased muscle spasms, Improper body mechanics, Decreased range of motion, Impaired flexibility, Postural dysfunction  Visit Diagnosis: Chronic bilateral low back pain with bilateral sciatica  Pain in left hip  Other muscle spasm  Difficulty in walking, not elsewhere classified     Problem List There are no problems to display for this patient.    Anette Guarneri, PT, DPT 10/07/20 9:30 AM    [facility] [address]  [address] [place], [place], [address] Phone: [phone]   Fax:  [phone]  Name: Syeda Prickett MRN: [id number] Date of Birth: Sep 13, 1965

## 2020-10-10 ENCOUNTER — Encounter: Payer: Medicaid Other | Admitting: Physical Therapy

## 2020-10-17 ENCOUNTER — Encounter: Payer: Self-pay | Admitting: Physical Therapy

## 2020-10-17 ENCOUNTER — Other Ambulatory Visit: Payer: Self-pay

## 2020-10-17 ENCOUNTER — Ambulatory Visit: Payer: Medicaid Other | Attending: Sports Medicine | Admitting: Physical Therapy

## 2020-10-17 DIAGNOSIS — M25552 Pain in left hip: Secondary | ICD-10-CM

## 2020-10-17 DIAGNOSIS — M5441 Lumbago with sciatica, right side: Secondary | ICD-10-CM | POA: Diagnosis present

## 2020-10-17 DIAGNOSIS — M5442 Lumbago with sciatica, left side: Secondary | ICD-10-CM | POA: Insufficient documentation

## 2020-10-17 DIAGNOSIS — R262 Difficulty in walking, not elsewhere classified: Secondary | ICD-10-CM | POA: Diagnosis present

## 2020-10-17 DIAGNOSIS — M62838 Other muscle spasm: Secondary | ICD-10-CM

## 2020-10-17 DIAGNOSIS — G8929 Other chronic pain: Secondary | ICD-10-CM | POA: Diagnosis present

## 2020-10-17 NOTE — Therapy (Signed)
[facility] [address]  [address] [place], [place], [address] Phone: [phone]   Fax:  [phone]  Physical Therapy Treatment  Patient Details  Name: Kathleen Foley MRN: [id number] Date of Birth: 18-Jul-1965 Referring Provider (PT): Albertha Ghee, MD   Encounter Date: 10/17/2020   PT End of Session - 10/17/20 1141     Visit Number 4    Number of Visits 13    Date for PT Re-Evaluation 10/23/20    Authorization Type UHC Medicaid    PT Start Time 1100    PT Stop Time 1140    PT Time Calculation (min) 40 min    Activity Tolerance Patient tolerated treatment well    Behavior During Therapy [facility] for tasks assessed/performed             History reviewed. No pertinent past medical history.  History reviewed. No pertinent surgical history.  There were no vitals filed for this visit.   Subjective Assessment - 10/17/20 1102     Subjective Reports that on the 29th she drove 5 hours and after this, had intense pain in the L buttock. Had trouble sleeping on either side d/t pain.    Pertinent History B hearing loss,    Diagnostic tests none recent    Patient Stated Goals decrease pain    Currently in Pain? Yes    Pain Score 8    Pain Location Buttocks    Pain Orientation Left    Pain Descriptors / Indicators Aching    Pain Type Chronic pain                               [facility] Adult PT Treatment/Exercise - 10/17/20 0001       Self-Care   Self-Care Other Self-Care Comments    Other Self-Care Comments  edu, demo, and practice using self-STM ball on wall to L glutes      Lumbar Exercises: Aerobic   Nustep L3x39min (UEs/LEs)      Knee/Hip Exercises: Stretches   Passive Hamstring Stretch Left;2 reps;30 seconds    Passive Hamstring Stretch Limitations supine with strap    Piriformis Stretch Left;2 reps;30 seconds    Piriformis Stretch Limitations fig 4, KTOS in supine      Knee/Hip Exercises:  Seated   Clamshell with TheraBand Green   x15   Hamstring Curl Strengthening;Left;1 set;15 reps    Hamstring Limitations red TB      Manual Therapy   Manual Therapy Soft tissue mobilization;Myofascial release    Manual therapy comments prone    Soft tissue mobilization STM to L proximal and lateral glutes, piriformis, proximal lateral HS    Myofascial Release manual TPR to L proximal and lateral glutes, piriformis, proximal lateral HS   very TTP and tight with several trigger pts                     PT Short Term Goals - 09/30/20 1138       PT SHORT TERM GOAL #1   Title Patient to be independent with initial HEP.    Time 3    Period Weeks    Status On-going   pt notes that she has not tried any exercises yet   Target Date 10/02/20               PT Long Term Goals - 09/30/20 1138  PT LONG TERM GOAL #1   Title Patient to be independent with advanced HEP.    Time 6    Period Weeks    Status On-going      PT LONG TERM GOAL #2   Title Patient to demonstrate lumbar AROM WFL and without pain limiting.    Time 6    Period Weeks    Status On-going      PT LONG TERM GOAL #3   Title Patient to report 70% improved tolerance for bed mobility and positioning.    Time 6    Period Weeks    Status On-going      PT LONG TERM GOAL #4   Title Patient to report 70% improvement in tolerance for household tasks d/t decreased pain.    Time 6    Period Weeks    Status On-going                   Plan - 10/17/20 1141     Clinical Impression Statement Patient arrived to session with report of increased pain in the L buttock after driving 5 hours on Wednesday. Notes difficulty sleeping, standing, and picking up her grandchild d/t pain. Addressed pain with STM and TPR to L glute, piriformis, and proximal HS. Patient was particularly tender and tight in these areas with multiple trigger points present. Improved tissue restriction was evident after MT. Proceeded  with gentle hip strengthening address muscle length in these muscles. Educated patient on use of ball on wall self-STM to address muscle tightness and pain at home. Again educated patient on importance of gentle movement when experiencing pain, as patient continues to admit HEP noncompliance when painful. Patient reported understanding and noted improved pain levels at end of session.    Personal Factors and Comorbidities Age;Comorbidity 1;Fitness;Past/Current Experience;Time since onset of injury/illness/exacerbation    Comorbidities B hearing loss    PT Frequency 2x / week    PT Duration 6 weeks    PT Treatment/Interventions ADLs/Self Care Home Management;Cryotherapy;Electrical Stimulation;Iontophoresis 4mg /ml Dexamethasone;Moist Heat;Balance training;Therapeutic exercise;Therapeutic activities;Functional mobility training;Stair training;Gait training;Ultrasound;Neuromuscular re-education;Patient/family education;Manual techniques;Taping;Energy conservation;Dry needling;Passive range of motion    PT Next Visit Plan progress lumbopelvic mobility and core strengthening    Consulted and Agree with Plan of Care Patient             Patient will benefit from skilled therapeutic intervention in order to improve the following deficits and impairments:  Decreased activity tolerance, Decreased strength, Increased fascial restricitons, Pain, Decreased balance, Difficulty walking, Increased muscle spasms, Improper body mechanics, Decreased range of motion, Impaired flexibility, Postural dysfunction  Visit Diagnosis: Chronic bilateral low back pain with bilateral sciatica  Pain in left hip  Other muscle spasm  Difficulty in walking, not elsewhere classified     Problem List There are no problems to display for this patient.    , PT, DPT 10/17/20 11:43 AM   [facility] [address]  [address] [place], [place],  [place] Phone: [phone]   Fax:  [phone]  Name: Kathleen Foley MRN: Kristian Covey Date of Birth: 1965/05/26

## 2020-10-21 ENCOUNTER — Other Ambulatory Visit: Payer: Self-pay

## 2020-10-21 ENCOUNTER — Ambulatory Visit: Payer: Medicaid Other

## 2020-10-21 DIAGNOSIS — R262 Difficulty in walking, not elsewhere classified: Secondary | ICD-10-CM

## 2020-10-21 DIAGNOSIS — M5442 Lumbago with sciatica, left side: Secondary | ICD-10-CM | POA: Diagnosis not present

## 2020-10-21 DIAGNOSIS — M5441 Lumbago with sciatica, right side: Secondary | ICD-10-CM

## 2020-10-21 DIAGNOSIS — M62838 Other muscle spasm: Secondary | ICD-10-CM

## 2020-10-21 DIAGNOSIS — G8929 Other chronic pain: Secondary | ICD-10-CM

## 2020-10-21 DIAGNOSIS — M25552 Pain in left hip: Secondary | ICD-10-CM

## 2020-10-21 NOTE — Therapy (Signed)
[facility] [address]  [place], [place], [address] Phone: [phone]   Fax:  [phone]  Physical Therapy Treatment  Patient Details  Name: Kathleen Foley MRN: [id number] Date of Birth: 1966/03/09 Referring Provider (PT): Wandra Feinstein, MD   Encounter Date: 10/21/2020   PT End of Session - 10/21/20 1014     Visit Number 5    Number of Visits 13    Date for PT Re-Evaluation 10/23/20    Authorization Type UHC Medicaid    PT Start Time 0932    PT Stop Time 1015    PT Time Calculation (min) 43 min    Activity Tolerance Patient tolerated treatment well    Behavior During Therapy [facility] for tasks assessed/performed             History reviewed. No pertinent past medical history.  History reviewed. No pertinent surgical history.  There were no vitals filed for this visit.   Subjective Assessment - 10/21/20 0936     Subjective Pt notes improvement from the STM from last session. Exercises are going ok. Back is doing better.    Pertinent History B hearing loss,    Diagnostic tests none recent    Patient Stated Goals decrease pain    Currently in Pain? Yes    Pain Score 4     Pain Location Back    Pain Orientation Right;Left;Lower    Pain Descriptors / Indicators Aching    Pain Type Acute pain;Chronic pain                               [facility] Adult PT Treatment/Exercise - 10/21/20 0001       Lumbar Exercises: Aerobic   Nustep L4x45mn (UEs/LEs)      Lumbar Exercises: Supine   Bent Knee Raise 10 reps;3 seconds    Bent Knee Raise Limitations with PPT and green TB    Bridge 15 reps    Bridge Limitations lots of instruction needed for form; green TB    Other Supine Lumbar Exercises B shoulder flexion with yellow TB and PPT 10 reps      Manual Therapy   Manual Therapy Soft tissue mobilization;Myofascial release    Manual therapy comments prone    Soft tissue mobilization STM to L  proximal and lateral glutes, piriformis, proximal lateral HS; B lumbar paraspinals    Myofascial Release manual TPR to L proximal and lateral glutes, piriformis                      PT Short Term Goals - 10/21/20 1051       PT SHORT TERM GOAL #1   Title Patient to be independent with initial HEP.    Time 3    Period Weeks    Status Partially Met   Pt requires mod cues for exercises and reports doing "some" of exercises   Target Date 10/02/20               PT Long Term Goals - 09/30/20 1138       PT LONG TERM GOAL #1   Title Patient to be independent with advanced HEP.    Time 6    Period Weeks    Status On-going      PT LONG TERM GOAL #2   Title Patient to demonstrate lumbar AROM WFL and without pain limiting.  Time 6    Period Weeks    Status On-going      PT LONG TERM GOAL #3   Title Patient to report 70% improved tolerance for bed mobility and positioning.    Time 6    Period Weeks    Status On-going      PT LONG TERM GOAL #4   Title Patient to report 70% improvement in tolerance for household tasks d/t decreased pain.    Time 6    Period Weeks    Status On-going                   Plan - 10/21/20 1017     Clinical Impression Statement Pt demonstrated less soft tissue restriction with STM today but still TTP in glutes and lumbar paraspinals. She requires much instruction and reassurance during exercises to ensure proper performance. Reminded her during session to prevent valslava while keeping PPT. She reported partial compliance with HEP and needed a lot of insructions with the bridges for proper technique. Cues required to isolate B shoulder flexion and keep PPT during exercise. She had reports of improvement with LBP and good response to [facility] post session.    Personal Factors and Comorbidities Age;Comorbidity 1;Fitness;Past/Current Experience;Time since onset of injury/illness/exacerbation    Comorbidities B hearing loss    PT  Frequency 2x / week    PT Duration 6 weeks    PT Treatment/Interventions ADLs/Self Care Home Management;Cryotherapy;Electrical Stimulation;Iontophoresis 27m/ml Dexamethasone;Moist Heat;Balance training;Therapeutic exercise;Therapeutic activities;Functional mobility training;Stair training;Gait training;Ultrasound;Neuromuscular re-education;Patient/family education;Manual techniques;Taping;Energy conservation;Dry needling;Passive range of motion    PT Next Visit Plan progress lumbopelvic mobility and core strengthening    Consulted and Agree with Plan of Care Patient             Patient will benefit from skilled therapeutic intervention in order to improve the following deficits and impairments:  Decreased activity tolerance, Decreased strength, Increased fascial restricitons, Pain, Decreased balance, Difficulty walking, Increased muscle spasms, Improper body mechanics, Decreased range of motion, Impaired flexibility, Postural dysfunction  Visit Diagnosis: Chronic bilateral low back pain with bilateral sciatica  Pain in left hip  Other muscle spasm  Difficulty in walking, not elsewhere classified     Problem List There are no problems to display for this patient.   BArtist Pais PTA 10/21/2020, 11:13 AM  [facility] [place] [address]Phone: [phone]  Fax:  [phone] Name: Kathleen LoseeMRN: [id number] of Birth: 107-26-1967

## 2020-10-23 ENCOUNTER — Ambulatory Visit: Payer: Medicaid Other | Admitting: Physical Therapy

## 2020-10-23 ENCOUNTER — Other Ambulatory Visit: Payer: Self-pay

## 2020-10-23 DIAGNOSIS — M5442 Lumbago with sciatica, left side: Secondary | ICD-10-CM | POA: Diagnosis not present

## 2020-10-23 DIAGNOSIS — M25552 Pain in left hip: Secondary | ICD-10-CM

## 2020-10-23 DIAGNOSIS — M62838 Other muscle spasm: Secondary | ICD-10-CM

## 2020-10-23 DIAGNOSIS — G8929 Other chronic pain: Secondary | ICD-10-CM

## 2020-10-23 DIAGNOSIS — R262 Difficulty in walking, not elsewhere classified: Secondary | ICD-10-CM

## 2020-10-23 NOTE — Therapy (Signed)
[facility] [address]  [place], [place], [address] Phone: [phone]   Fax:  [phone]  Physical Therapy Treatment  Patient Details  Name: Kathleen Foley MRN: [id number] Date of Birth: [birth year] Referring Provider (PT): Wandra Feinstein, MD   Encounter Date: 10/23/2020   PT End of Session - 10/23/20 1144     Visit Number 6    Number of Visits 10    Date for PT Re-Evaluation 11/20/20    Authorization Type UHC Medicaid    PT Start Time 1101    PT Stop Time 1141    PT Time Calculation (min) 40 min    Activity Tolerance Patient tolerated treatment well    Behavior During Therapy [facility] for tasks assessed/performed             Past Medical History:  Diagnosis Date   Abnormal EKG    Arm paresthesia, left    Precordial chest pain     History reviewed. No pertinent surgical history.  There were no vitals filed for this visit.   Subjective Assessment - 10/23/20 1103     Subjective Notes that her pain is less frequent. Feeling better than last session.    Pertinent History B hearing loss,    Diagnostic tests none recent    Patient Stated Goals decrease pain    Currently in Pain? Yes    Pain Score 5     Pain Location Back    Pain Orientation Right;Left;Lower    Pain Descriptors / Indicators Aching    Pain Type Chronic pain                [facility] PT Assessment - 10/23/20 1107       Assessment   Medical Diagnosis Lumbar spondylosis/DDD    Referring Provider (PT) Wandra Feinstein, MD    Onset Date/Surgical Date --   >20 years     AROM   Lumbar Flexion distal shin    Lumbar Extension WFL    Lumbar - Right Side Bend jt line    Lumbar - Left Side Bend jt line    Lumbar - Right Rotation WFL   mild LBP   Lumbar - Left Rotation [facility]                           [facility] Adult PT Treatment/Exercise - 10/23/20 0001       Therapeutic Activites    Therapeutic Activities Lifting     Lifting practice lifting 10# box from floor to chest using proper body mechanics 5x   good carryover of form     Lumbar Exercises: Aerobic   Nustep L5x74mn (UEs/LEs)      Lumbar Exercises: Standing   Other Standing Lumbar Exercises hip hinge with dowel behind bacl 10x; hip hinge to touch hands on elevated stool and touching bottom onto wall 10x   cueing to maintain knees bent, spine straight     Lumbar Exercises: Supine   Bridge with clamshell 10 reps   green TB 2x10     Lumbar Exercises: Sidelying   Hip Abduction Right;Left;10 reps    Hip Abduction Weights (lbs) 2    Hip Abduction Limitations cueing for proper alignment    Other Sidelying Lumbar Exercises R/L hip adduction with opposite LE elevated on bolster 2# 10x each   cues for improved eccentric control  PT Education - 10/23/20 1144     Education Details update to HEP; adminsitered red TB for rows and green TB for bridges; encouraged patient to increase HEP compliance for max benefit    Person(s) Educated Patient    Methods Explanation;Demonstration;Tactile cues;Verbal cues;Handout    Comprehension Returned demonstration;Verbalized understanding              PT Short Term Goals - 10/23/20 1107       PT SHORT TERM GOAL #1   Title Patient to be independent with initial HEP.    Time 3    Period Weeks    Status Achieved    Target Date 10/02/20               PT Long Term Goals - 10/23/20 1107       PT LONG TERM GOAL #1   Title Patient to be independent with advanced HEP.    Time 4    Period Weeks    Status Partially Met   denies questions on HEP, but reports intermittent compliance d/t her grandchildren   Target Date 11/20/20      PT LONG TERM GOAL #2   Title Patient to demonstrate lumbar AROM WFL and without pain limiting.    Time 4    Period Weeks    Status Partially Met   improved in flexion and extension; mild pain still present   Target Date 11/20/20      PT LONG TERM  GOAL #3   Title Patient to report 70% improved tolerance for bed mobility and positioning.    Time 4    Period Weeks    Status On-going   reports 80% improvement   Target Date 11/20/20      PT LONG TERM GOAL #4   Title Patient to report 70% improvement in tolerance for household tasks d/t decreased pain.    Time 4    Period Weeks    Status On-going   reports 80% improvement   Target Date 11/20/20                   Plan - 10/23/20 1145     Clinical Impression Statement Patient arrived to session with report of decreased frequency of pain in LB since starting therapy. She reports 80% improvement in bed mobility and positioning tolerance and 80% improvement in tolerance for household chores. Notes that with household activities, most pain occurs with sweeping, vacuuming, and getting back up from a squatting positioning. Lumbar AROM has improved in flexion and extension as well as in pain levels at this time. Educated patient on hip hinges with various forms of verbal and tactile cueing, and then with practice lifting a weighted box from the floor. Patient was able to demonstrate good carryover of technique and reported good tolerance with lifting today. End of session focused on progressive hip and core strengthening ther-ex with improved form and carryover. Patient is demonstrating good progress towards goals. Would benefit from additional skilled PT services 1x/week for 4 weeks to address remaining deficits.    Personal Factors and Comorbidities Age;Comorbidity 1;Fitness;Past/Current Experience;Time since onset of injury/illness/exacerbation    Comorbidities B hearing loss    PT Frequency 1x / week    PT Duration 4 weeks    PT Treatment/Interventions ADLs/Self Care Home Management;Cryotherapy;Electrical Stimulation;Iontophoresis 40m/ml Dexamethasone;Moist Heat;Balance training;Therapeutic exercise;Therapeutic activities;Functional mobility training;Stair training;Gait  training;Ultrasound;Neuromuscular re-education;Patient/family education;Manual techniques;Taping;Energy conservation;Dry needling;Passive range of motion    PT Next Visit Plan lifting mechanics, progress lumbopelvic  mobility and core strengthening    Consulted and Agree with Plan of Care Patient             Patient will benefit from skilled therapeutic intervention in order to improve the following deficits and impairments:  Decreased activity tolerance, Decreased strength, Increased fascial restricitons, Pain, Decreased balance, Difficulty walking, Increased muscle spasms, Improper body mechanics, Decreased range of motion, Impaired flexibility, Postural dysfunction  Visit Diagnosis: Chronic bilateral low back pain with bilateral sciatica  Pain in left hip  Other muscle spasm  Difficulty in walking, not elsewhere classified     Problem List There are no problems to display for this patient.    Janene Harvey, PT, DPT 10/23/20 11:52 AM    [facility] [address]  [place], [place], [address] Phone: [phone]   Fax:  [phone]  Name: Kathleen Foley MRN: [id number] Date of Birth: [birth year]

## 2020-10-29 ENCOUNTER — Ambulatory Visit: Payer: Medicaid Other | Admitting: Rehabilitative and Restorative Service Providers"

## 2020-10-29 ENCOUNTER — Encounter: Payer: Self-pay | Admitting: Rehabilitative and Restorative Service Providers"

## 2020-10-29 ENCOUNTER — Other Ambulatory Visit: Payer: Self-pay

## 2020-10-29 DIAGNOSIS — M25552 Pain in left hip: Secondary | ICD-10-CM

## 2020-10-29 DIAGNOSIS — M5442 Lumbago with sciatica, left side: Secondary | ICD-10-CM | POA: Diagnosis not present

## 2020-10-29 DIAGNOSIS — R262 Difficulty in walking, not elsewhere classified: Secondary | ICD-10-CM

## 2020-10-29 DIAGNOSIS — M62838 Other muscle spasm: Secondary | ICD-10-CM

## 2020-10-29 DIAGNOSIS — G8929 Other chronic pain: Secondary | ICD-10-CM

## 2020-10-29 NOTE — Therapy (Signed)
[facility] [address]  [place], [place], [address] Phone: [phone]   Fax:  [phone]  Physical Therapy Treatment  Patient Details  Name: Kathleen Foley MRN: [id number] Date of Birth: 01-06-1966 Referring Provider (PT): Wandra Feinstein, MD   Encounter Date: 10/29/2020   PT End of Session - 10/29/20 1022     PT Start Time 1015    PT Stop Time 1055    PT Time Calculation (min) 40 min    Activity Tolerance Patient tolerated treatment well    Behavior During Therapy [facility] for tasks assessed/performed             Past Medical History:  Diagnosis Date   Abnormal EKG    Arm paresthesia, left    Precordial chest pain     History reviewed. No pertinent surgical history.  There were no vitals filed for this visit.   Subjective Assessment - 10/29/20 1011     Subjective I am doing okay    Pertinent History B hearing loss,    Patient Stated Goals decrease pain    Currently in Pain? Yes    Pain Score 5     Pain Location Back    Pain Orientation Right;Lower    Pain Descriptors / Indicators Tightness    Pain Score 5    Pain Location Hip    Pain Orientation Left    Pain Descriptors / Indicators Aching                               [facility] Adult PT Treatment/Exercise - 10/29/20 0001       High Level Balance   High Level Balance Activities Side stepping    High Level Balance Comments Side stepping with red theraband down PT gym and back      Lumbar Exercises: Stretches   Piriformis Stretch Right;Left;3 reps;20 seconds    Piriformis Stretch Limitations supine      Lumbar Exercises: Aerobic   Nustep L5 x19mn (UEs/LEs)      Lumbar Exercises: Standing   Row Strengthening;Both;20 reps;Theraband    Theraband Level (Row) Level 2 (Red)    Shoulder Extension Strengthening;Both;20 reps;Theraband    Theraband Level (Shoulder Extension) Level 2 (Red)      Lumbar Exercises: Seated   Sit  to Stand 10 reps    Other Seated Lumbar Exercises HS curl with green tband 2x10 bilat      Lumbar Exercises: Supine   Bridge with clamshell 20 reps   green tband 2x10     Lumbar Exercises: Quadruped   Straight Leg Raise 10 reps;1 second      Knee/Hip Exercises: Standing   Hip Flexion Stengthening;Both;1 set;10 reps    Hip Flexion Limitations 2#    Hip Abduction Stengthening;Both;1 set;10 reps    Abduction Limitations 2#    Hip Extension Stengthening;Both;1 set;10 reps    Extension Limitations 2#                      PT Short Term Goals - 10/23/20 1107       PT SHORT TERM GOAL #1   Title Patient to be independent with initial HEP.    Time 3    Period Weeks    Status Achieved    Target Date 10/02/20               PT Long Term Goals -  10/29/20 1146       PT LONG TERM GOAL #1   Title Patient to be independent with advanced HEP.    Status Partially Met      PT LONG TERM GOAL #2   Title Patient to demonstrate lumbar AROM WFL and without pain limiting.    Status Partially Met      PT LONG TERM GOAL #3   Title Patient to report 70% improved tolerance for bed mobility and positioning.    Status On-going      PT LONG TERM GOAL #4   Title Patient to report 70% improvement in tolerance for household tasks d/t decreased pain.    Status On-going                   Plan - 10/29/20 1140     Clinical Impression Statement Patient reports similar symptoms of LBP to last session and states that she has been having some R hip pain.  She continues to progress with strengthening and tolerated new exercises well, but did have some fatigue.  She stated that her asthma was bothering her some, but was able to perform pursed lip breathing successfully.  She required some cuing during standing hip exercises for proper positioning and to avoid hip ER with abduction and to avoid trunk flexion with hip extension.  She continues to require skilled PT to progress towards  goal related activities.    Personal Factors and Comorbidities Age;Comorbidity 1;Fitness;Past/Current Experience;Time since onset of injury/illness/exacerbation    Comorbidities B hearing loss    PT Treatment/Interventions ADLs/Self Care Home Management;Cryotherapy;Electrical Stimulation;Iontophoresis 51m/ml Dexamethasone;Moist Heat;Balance training;Therapeutic exercise;Therapeutic activities;Functional mobility training;Stair training;Gait training;Ultrasound;Neuromuscular re-education;Patient/family education;Manual techniques;Taping;Energy conservation;Dry needling;Passive range of motion    PT Next Visit Plan lifting mechanics, progress lumbopelvic mobility and core strengthening    Consulted and Agree with Plan of Care Patient             Patient will benefit from skilled therapeutic intervention in order to improve the following deficits and impairments:  Decreased activity tolerance, Decreased strength, Increased fascial restricitons, Pain, Decreased balance, Difficulty walking, Increased muscle spasms, Improper body mechanics, Decreased range of motion, Impaired flexibility, Postural dysfunction  Visit Diagnosis: Chronic bilateral low back pain with bilateral sciatica  Pain in left hip  Other muscle spasm  Difficulty in walking, not elsewhere classified     Problem List There are no problems to display for this patient.   SJuel Burrow PT, DPT 10/29/2020, 11:47 AM  [facility] [place] [address]Phone: [phone]  Fax:  [phone] Name: Kathleen CaskeyMRN: [id number] of Birth: 105/26/1967

## 2020-11-05 ENCOUNTER — Other Ambulatory Visit: Payer: Self-pay

## 2020-11-05 ENCOUNTER — Ambulatory Visit: Payer: Medicaid Other

## 2020-11-05 DIAGNOSIS — M25552 Pain in left hip: Secondary | ICD-10-CM

## 2020-11-05 DIAGNOSIS — R262 Difficulty in walking, not elsewhere classified: Secondary | ICD-10-CM

## 2020-11-05 DIAGNOSIS — M62838 Other muscle spasm: Secondary | ICD-10-CM

## 2020-11-05 DIAGNOSIS — G8929 Other chronic pain: Secondary | ICD-10-CM

## 2020-11-05 DIAGNOSIS — M5442 Lumbago with sciatica, left side: Secondary | ICD-10-CM

## 2020-11-05 NOTE — Therapy (Signed)
[facility] [address]  [place], [place], [address] Phone: [phone]   Fax:  [phone]  Physical Therapy Treatment  Patient Details  Name: Kathleen Foley MRN: [id number] Date of Birth: 1965/05/23 Referring Provider (PT): Wandra Feinstein, MD   Encounter Date: 11/05/2020   PT End of Session - 11/05/20 1157     Visit Number 8    Number of Visits 10    Date for PT Re-Evaluation 11/20/20    Authorization Type UHC Medicaid    PT Start Time 1111    PT Stop Time 1149    PT Time Calculation (min) 38 min    Activity Tolerance Patient tolerated treatment well    Behavior During Therapy [facility] for tasks assessed/performed             Past Medical History:  Diagnosis Date   Abnormal EKG    Arm paresthesia, left    Precordial chest pain     History reviewed. No pertinent surgical history.  There were no vitals filed for this visit.   Subjective Assessment - 11/05/20 1114     Subjective Has been working in the garden, has a little pain right now.    Pertinent History B hearing loss,    Diagnostic tests none recent    Patient Stated Goals decrease pain    Currently in Pain? Yes    Pain Score 5     Pain Location Back    Pain Orientation Right    Pain Descriptors / Indicators Tightness    Pain Type Chronic pain                               [facility] Adult PT Treatment/Exercise - 11/05/20 0001       Lumbar Exercises: Aerobic   Nustep L5 x62mn (UEs/LEs)      Lumbar Exercises: Standing   Row Strengthening;Both;20 reps;Theraband    Theraband Level (Row) Level 2 (Red)    Shoulder Extension Strengthening;Both;20 reps;Theraband    Theraband Level (Shoulder Extension) Level 2 (Red)    Other Standing Lumbar Exercises lat pull with marches red TB 10 reps    Other Standing Lumbar Exercises pallof press and trunk rotations with green TB R/L 10 reps      Knee/Hip Exercises: Standing   Hip  Flexion Stengthening;Both;2 sets;10 reps;Knee bent    Hip Flexion Limitations 2#    Hip Abduction Stengthening;Both;10 reps;Knee straight    Abduction Limitations 2#                      PT Short Term Goals - 10/23/20 1107       PT SHORT TERM GOAL #1   Title Patient to be independent with initial HEP.    Time 3    Period Weeks    Status Achieved    Target Date 10/02/20               PT Long Term Goals - 11/05/20 1120       PT LONG TERM GOAL #1   Title Patient to be independent with advanced HEP.    Status Partially Met      PT LONG TERM GOAL #2   Title Patient to demonstrate lumbar AROM WFL and without pain limiting.    Status Partially Met      PT LONG TERM GOAL #3   Title Patient to report 70%  improved tolerance for bed mobility and positioning.    Status Achieved   80% improvement per pt statement     PT LONG TERM GOAL #4   Title Patient to report 70% improvement in tolerance for household tasks d/t decreased pain.    Status On-going   60% improvement; has to "rub herself down" when done with gardening                  Plan - 11/05/20 1158     Clinical Impression Statement Pt has met LTG 3 and making progress toward LTG 4. Overall she has seen improvement with pain levels but does note that she has to "rub down her muscles and rest" after doing a lot of gardening. We continued with core stab exercises and she demonstrated mod difficulty with these. Cues required for proper form and to produce the desired movement with most exercises. She reported R shoulder pain with the resisted horizontal ABD so we deferred this exercises. She reports that her HEP has become easier so next visit we will plan to update HEP. Pt resonded well.    Personal Factors and Comorbidities Age;Comorbidity 1;Fitness;Past/Current Experience;Time since onset of injury/illness/exacerbation    Comorbidities B hearing loss    PT Frequency 1x / week    PT Duration 4 weeks     PT Treatment/Interventions ADLs/Self Care Home Management;Cryotherapy;Electrical Stimulation;Iontophoresis 46m/ml Dexamethasone;Moist Heat;Balance training;Therapeutic exercise;Therapeutic activities;Functional mobility training;Stair training;Gait training;Ultrasound;Neuromuscular re-education;Patient/family education;Manual techniques;Taping;Energy conservation;Dry needling;Passive range of motion    PT Next Visit Plan lifting mechanics, progress lumbopelvic mobility and core strengthening    Consulted and Agree with Plan of Care Patient             Patient will benefit from skilled therapeutic intervention in order to improve the following deficits and impairments:  Decreased activity tolerance, Decreased strength, Increased fascial restricitons, Pain, Decreased balance, Difficulty walking, Increased muscle spasms, Improper body mechanics, Decreased range of motion, Impaired flexibility, Postural dysfunction  Visit Diagnosis: Chronic bilateral low back pain with bilateral sciatica  Pain in left hip  Other muscle spasm  Difficulty in walking, not elsewhere classified     Problem List There are no problems to display for this patient.   BArtist Pais PTA 11/05/2020, 12:09 PM  [facility] [place] [address]Phone: [phone]  Fax:  [phone] Name: Kathleen ColbaughMRN: [id number] of Birth: 101-15-1967

## 2020-11-19 ENCOUNTER — Other Ambulatory Visit: Payer: Self-pay

## 2020-11-19 ENCOUNTER — Ambulatory Visit: Payer: Medicaid Other | Attending: Sports Medicine | Admitting: Physical Therapy

## 2020-11-19 DIAGNOSIS — R262 Difficulty in walking, not elsewhere classified: Secondary | ICD-10-CM

## 2020-11-19 DIAGNOSIS — M25552 Pain in left hip: Secondary | ICD-10-CM | POA: Diagnosis present

## 2020-11-19 DIAGNOSIS — G8929 Other chronic pain: Secondary | ICD-10-CM

## 2020-11-19 DIAGNOSIS — M5442 Lumbago with sciatica, left side: Secondary | ICD-10-CM | POA: Diagnosis present

## 2020-11-19 DIAGNOSIS — M62838 Other muscle spasm: Secondary | ICD-10-CM | POA: Diagnosis present

## 2020-11-19 DIAGNOSIS — M5441 Lumbago with sciatica, right side: Secondary | ICD-10-CM | POA: Diagnosis present

## 2020-11-19 NOTE — Therapy (Signed)
[facility] [address]  [place], [place], [address] Phone: [phone]   Fax:  [phone]  Physical Therapy Treatment/Discharge  Patient Details  Name: Kathleen Foley MRN: [id number] Date of Birth: 1965-07-08 Referring Provider (PT): Wandra Feinstein, MD   Encounter Date: 11/19/2020   PT End of Session - 11/19/20 1154     Visit Number 9    Number of Visits 10    Date for PT Re-Evaluation 11/20/20    Authorization Type UHC Medicaid    PT Start Time 1102    PT Stop Time 1145    PT Time Calculation (min) 43 min    Activity Tolerance Patient tolerated treatment well    Behavior During Therapy [facility] for tasks assessed/performed             Past Medical History:  Diagnosis Date   Abnormal EKG    Arm paresthesia, left    Precordial chest pain     No past surgical history on file.  There were no vitals filed for this visit.   Subjective Assessment - 11/19/20 1111     Subjective Patient reports she has  pain everyday, when she gardens that is a bad day.  She does not want to continued physical therapy at this time.  Overall she reports 25% improvement in pain.  She has not been consistent with HEP "I am not going to lie to you."    Pertinent History B hearing loss,    Limitations Sitting;Lifting;Standing;Walking;House hold activities    How long can you sit comfortably? a couple hours    How long can you stand comfortably? a couple hours    How long can you walk comfortably? a couple hours    Diagnostic tests none recent    Patient Stated Goals decrease pain    Currently in Pain? Yes    Pain Score 5     Pain Location Back    Pain Orientation Right    Pain Descriptors / Indicators Tightness                [facility] PT Assessment - 11/19/20 0001       Assessment   Medical Diagnosis Lumbar spondylosis/DDD    Referring Provider (PT) Wandra Feinstein, MD                            [facility] Adult PT Treatment/Exercise - 11/19/20 0001       Therapeutic Activites    Therapeutic Activities ADL's;Lifting    ADL's recommendations for using golfer's lift with reaching, making bed.  Pillow between legs if sleeping on side.  Recommendations for garden kneeler and positions to decrease pain.    Lifting education on lifting, golfers lift.  Handout given      Lumbar Exercises: Aerobic   Nustep L5 x59mn (UEs/LEs)      Lumbar Exercises: Supine   Pelvic Tilt 10 reps;5 seconds    Pelvic Tilt Limitations posterior, cues for breathing    Other Supine Lumbar Exercises crunches, x 10, cues for breathing    Other Supine Lumbar Exercises 90/90 abdominal bracing x 5 5 sec hold, cues for technique.                    PT Education - 11/19/20 1153     Education Details updated HEP, educated on lifting mechanics, recommendations for garden kneeler/seat to help with gardening.  Person(s) Educated Patient    Methods Explanation;Demonstration;Verbal cues;Handout    Comprehension Verbalized understanding;Returned demonstration              PT Short Term Goals - 10/23/20 1107       PT SHORT TERM GOAL #1   Title Patient to be independent with initial HEP.    Time 3    Period Weeks    Status Achieved    Target Date 10/02/20               PT Long Term Goals - 11/19/20 1145       PT LONG TERM GOAL #1   Title Patient to be independent with advanced HEP.   progressed HEP.  Reports partial complaince.   Time 4    Period Weeks    Status Partially Met    Target Date 11/20/20      PT LONG TERM GOAL #2   Title Patient to demonstrate lumbar AROM WFL and without pain limiting.    Time 4    Period Weeks    Status Partially Met   still limited by pain.   Target Date 11/20/20      PT LONG TERM GOAL #3   Title Patient to report 70% improved tolerance for bed mobility and positioning.    Time 4    Period Weeks    Status Partially Met   reports 25% improvement.    Target Date 11/20/20      PT LONG TERM GOAL #4   Title Patient to report 70% improvement in tolerance for household tasks d/t decreased pain.    Time 4    Period Weeks    Status Partially Met   reports 25% improvement   Target Date 11/20/20                   Plan - 11/19/20 1154     Clinical Impression Statement Pt. reports 25% improvement overall, but does not wish to continue with PT past today.  Focus of session was reviewing lifting mechanics, recommendations to decrease pain with gardening (garden kneeler/seat, tall kneeling in lunge position), as well as progressing HEP for core strengthening and abdominal strengthening.   She was able to lift large 15# box from floor without pain, and return demo with golfers lift with 1 UE support on counter without pain.    Personal Factors and Comorbidities Age;Comorbidity 1;Fitness;Past/Current Experience;Time since onset of injury/illness/exacerbation    Comorbidities B hearing loss    PT Frequency 1x / week    PT Duration 4 weeks    PT Treatment/Interventions ADLs/Self Care Home Management;Cryotherapy;Electrical Stimulation;Iontophoresis 3m/ml Dexamethasone;Moist Heat;Balance training;Therapeutic exercise;Therapeutic activities;Functional mobility training;Stair training;Gait training;Ultrasound;Neuromuscular re-education;Patient/family education;Manual techniques;Taping;Energy conservation;Dry needling;Passive range of motion    Consulted and Agree with Plan of Care Patient             Patient will benefit from skilled therapeutic intervention in order to improve the following deficits and impairments:  Decreased activity tolerance, Decreased strength, Increased fascial restricitons, Pain, Decreased balance, Difficulty walking, Increased muscle spasms, Improper body mechanics, Decreased range of motion, Impaired flexibility, Postural dysfunction  Visit Diagnosis: Chronic bilateral low back pain with bilateral sciatica  Pain in  left hip  Other muscle spasm  Difficulty in walking, not elsewhere classified     Problem List There are no problems to display for this patient.   PHYSICAL THERAPY DISCHARGE SUMMARY  Visits from Start of Care: 9  Current functional level related to goals /  functional outcomes: independent   Remaining deficits: Continued back pain, difficulty sleeping   Education / Equipment: Yes, education on HEP, lifting and ADLs.  Therabands issued.    Plan: Patient agrees to discharge.  Patient goals were not met. Patient is being discharged per request.       Rennie Natter PT, DPT 11/19/2020, 12:02 PM  [facility] [address]  [place], [place], [address] Phone: [phone]   Fax:  [phone]  Name: Kathleen Foley MRN: [id number] Date of Birth: [birth year]

## 2020-11-19 NOTE — Patient Instructions (Signed)
Access Code: [email or web address] URL: [email or web address] Date: 11/19/2020 Prepared by: Harrie Foreman  Exercises Single Leg Balance with Alternating Floor Reaches - 1 x daily - 7 x weekly - 2 sets - 10 reps Supine Posterior Pelvic Tilt - 1 x daily - 7 x weekly - 2 sets - 10 reps - 5 sec hold Supine 90/90 Abdominal Bracing - 1 x daily - 7 x weekly - 2 sets - 10 reps Curl Up with Reach - 1 x daily - 7 x weekly - 2 sets - 10 reps  Patient Education Lifting Techniques

## 2020-11-25 ENCOUNTER — Telehealth: Payer: Self-pay

## 2020-11-25 NOTE — Telephone Encounter (Deleted)
Error made. Correction: Pt has appointment with Dr. Royann Shivers on 9-27 at 1:40 p.m.

## 2020-11-25 NOTE — Telephone Encounter (Addendum)
Patient has an appointment with Dr. Royann Shivers on 01-13-21 at 1:40 p.m. Notes have been faxed to [facility].

## 2020-11-26 ENCOUNTER — Ambulatory Visit: Payer: Medicaid Other | Admitting: Cardiology

## 2020-12-05 ENCOUNTER — Encounter: Payer: Self-pay | Admitting: Diagnostic Neuroimaging

## 2020-12-05 ENCOUNTER — Ambulatory Visit (INDEPENDENT_AMBULATORY_CARE_PROVIDER_SITE_OTHER): Payer: Medicaid Other | Admitting: Diagnostic Neuroimaging

## 2020-12-05 VITALS — BP 132/86 | HR 94 | Ht 62.5 in | Wt 187.2 lb

## 2020-12-05 DIAGNOSIS — R200 Anesthesia of skin: Secondary | ICD-10-CM | POA: Diagnosis not present

## 2020-12-05 DIAGNOSIS — M25552 Pain in left hip: Secondary | ICD-10-CM | POA: Diagnosis not present

## 2020-12-05 NOTE — Patient Instructions (Signed)
  LEFT ARM NUMBNESS (resolved; May 2022 in setting of chest pain) - no further testing advised; follow up with PCP  LEFT HIP PAIN / BACK PAIN - follow up with orthopedic clinic and PT
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
## 2021-01-13 ENCOUNTER — Ambulatory Visit: Payer: Medicaid Other | Admitting: Cardiovascular Disease

## 2021-01-26 ENCOUNTER — Other Ambulatory Visit: Payer: Self-pay | Admitting: Family

## 2021-01-26 DIAGNOSIS — Z1231 Encounter for screening mammogram for malignant neoplasm of breast: Secondary | ICD-10-CM

## 2021-03-27 IMAGING — CR DG HIP (WITH OR WITHOUT PELVIS) 2-3V*L*
2 series · 2 of 2 positions shown · non-contrast
Comparison: None Available.

CLINICAL DATA: Left hip pain

EXAM:
DG HIP (WITH OR WITHOUT PELVIS) 2-3V LEFT

[w hip ap left]
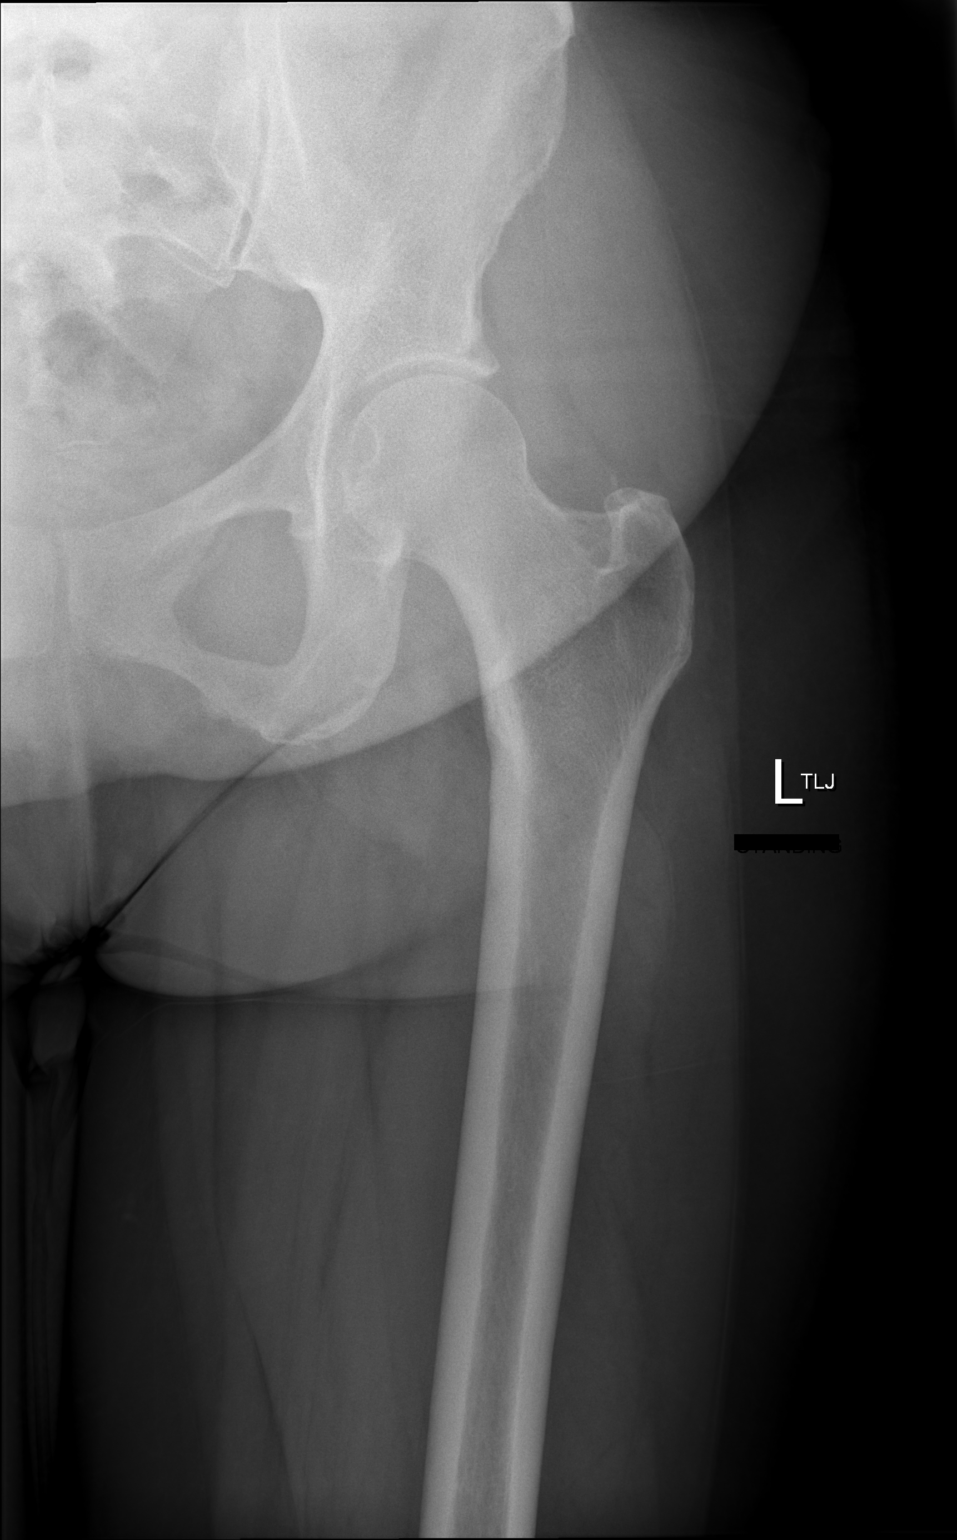

[w hip lat left]
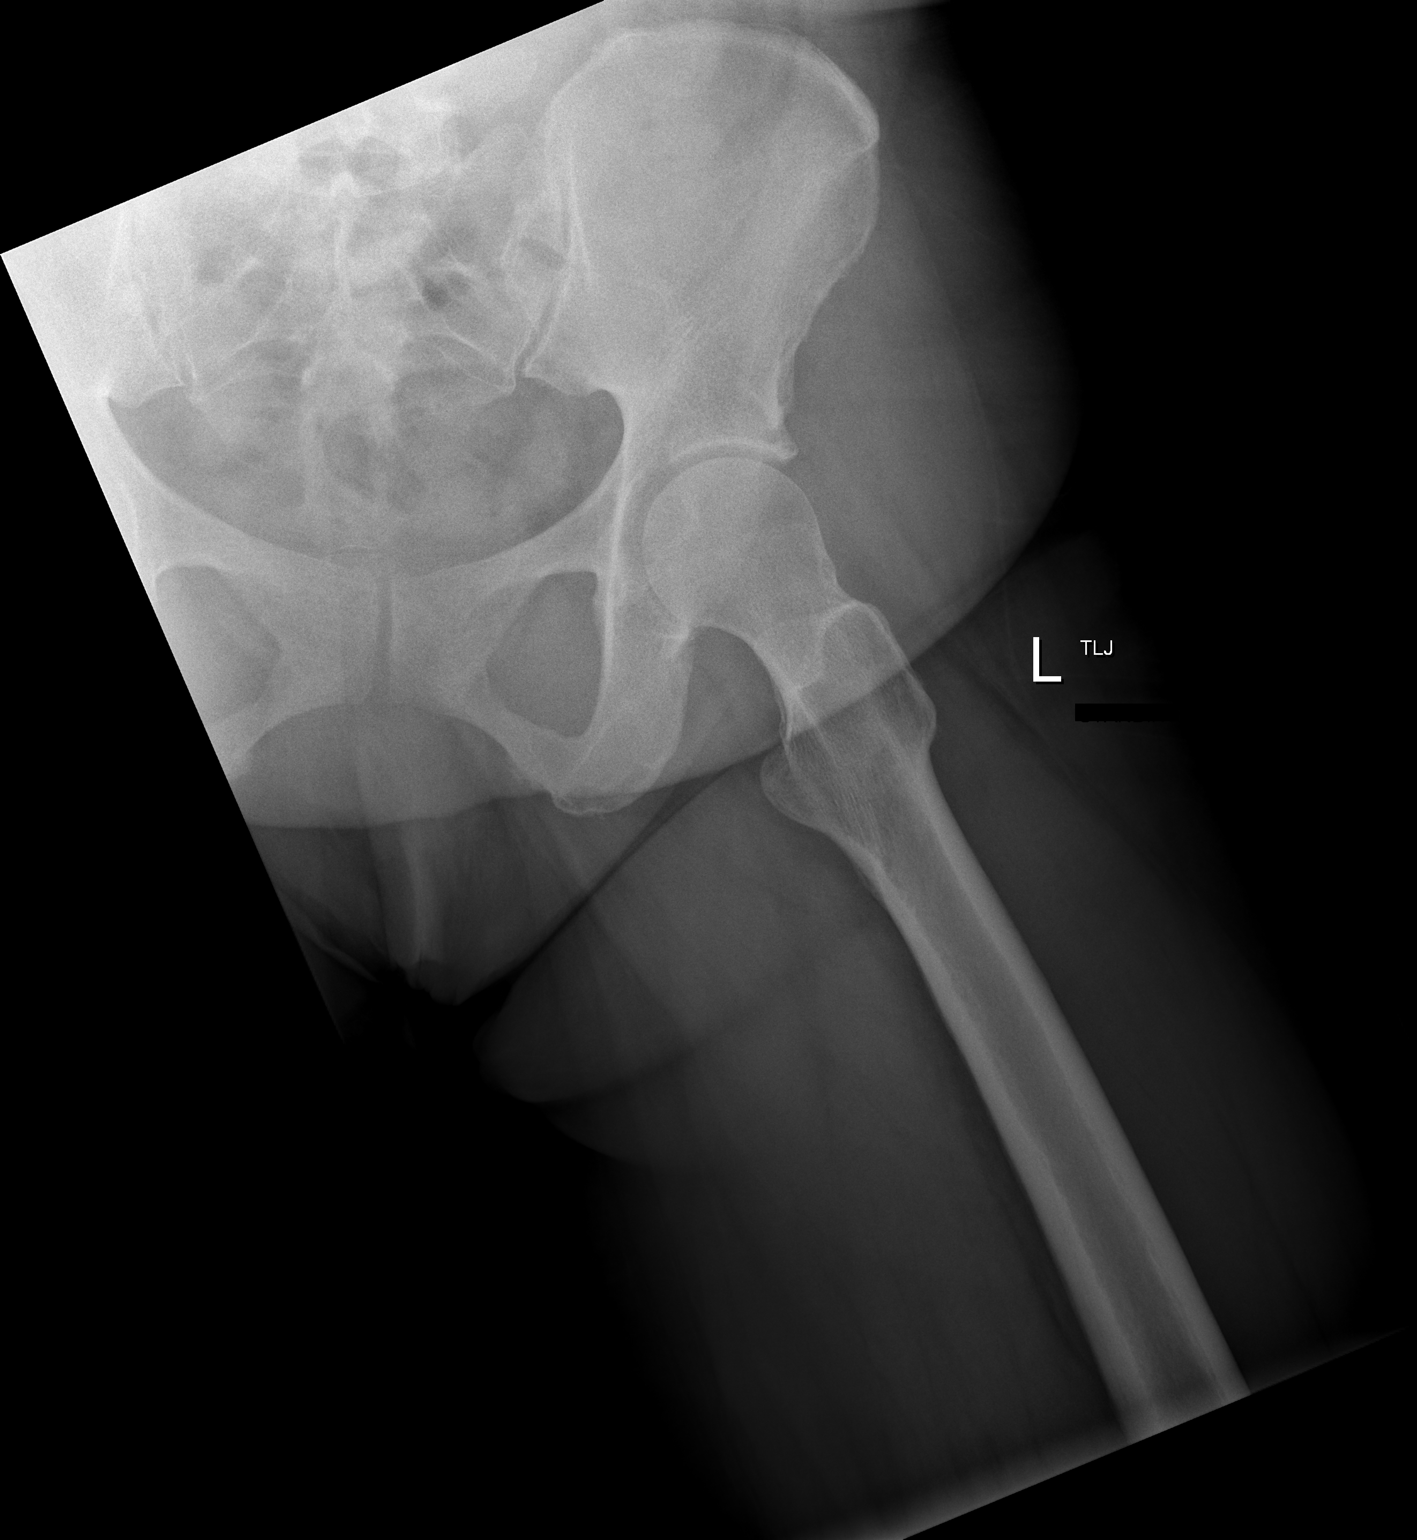

[2 of 2 positions shown; findings below may reference images not displayed]

FINDINGS: There is no evidence of hip fracture or dislocation. There is no
evidence of arthropathy or other focal bone abnormality.
IMPRESSION: Negative.

## 2021-04-17 NOTE — Progress Notes (Signed)
Cardiology Office Note:    Date:  04/21/2021   ID:  Kristian Covey, DOB [birth year], MRN [id number]  PCP:  System, Provider Not In   [facility] Providers Cardiologist:  None {   Referring MD: Maia Plan, MD     History of Present Illness:    Kathleen Foley is a 55 y.o. female with a hx of HTN and HLD who was referred by Dr. Jacqulyn Bath for further evaluation of chest pain.  Patient seen in the ER 09/11/20 for left chest soreness and numbness in the left arm. ECG without ischemic changes. Trop negative. CT head and neck without acute pathology. Pain atypical. Given reassuring work-up, she was discharged home and referred to Cardiology for further work-up.  Today, the patient states she continues to have shooting pain in her chest with associated shortness of breath. Occurs about 2x/week. Lasts a couple of seconds and then resolves without intervention. Can be worsened with laying on right side. Symptoms do not correlate with exertion. Patient admits she is not very active and is unable to walk a full flight of stairs without stopping to catch her breath. States this is getting slightly worse and finds that if she walks, she gets more fatigued and wiped out requiring her to rest. Otherwise, she has occasional lightheadedness, dizziness. No syncope. Occasional ankle edema after prolonged standing. No orthopnea or PND.  Sister with history MI in her 30s.   Past Medical History:  Diagnosis Date   Abnormal EKG    Arm paresthesia, left    Hyperlipidemia    Hypertension    Precordial chest pain     Past Surgical History:  Procedure Laterality Date   ABDOMINAL HYSTERECTOMY     CESAREAN SECTION     x 3   CHOLECYSTECTOMY      Current Medications: Current Meds  Medication Sig   albuterol (PROVENTIL) (2.5 MG/3ML) 0.083% nebulizer solution Inhale into the lungs.   amLODipine (NORVASC) 2.5 MG tablet TAKE ONE TABLET IN THE MORNING   cetirizine (ZYRTEC) 10 MG tablet TAKE ONE TABLET IN THE  MORNING   dexlansoprazole (DEXILANT) 60 MG capsule Take 1 capsule by mouth every morning.   escitalopram (LEXAPRO) 20 MG tablet TAKE ONE TABLET IN THE MORNING   gabapentin (NEURONTIN) 300 MG capsule SMARTSIG:1 Capsule(s) By Mouth Every Evening   GOODSENSE ASPIRIN LOW DOSE 81 MG EC tablet TAKE ONE TABLET IN THE MORNING   meloxicam (MOBIC) 15 MG tablet TAKE ONE TABLET IN THE MORNING   montelukast (SINGULAIR) 10 MG tablet SMARTSIG:1 Tablet(s) By Mouth Every Evening   oxybutynin (DITROPAN-XL) 10 MG 24 hr tablet TAKE ONE TABLET IN THE MORNING   pravastatin (PRAVACHOL) 20 MG tablet SMARTSIG:1 Tablet(s) By Mouth Every Evening   Prenatal Vit-Fe Fumarate-FA (PRENATAL MULTIVITAMIN) TABS tablet Take 1 tablet by mouth daily at 12 noon.   RESTASIS 0.05 % ophthalmic emulsion 1 drop 2 (two) times daily.   [DISCONTINUED] metoprolol tartrate (LOPRESSOR) 100 MG tablet Take 1 tablet (100 mg total) by mouth once for 1 dose. Take 90-120 minutes prior to scan.   [DISCONTINUED] spironolactone (ALDACTONE) 25 MG tablet Take 1 tablet (25 mg total) by mouth daily.     Allergies:   Hydrocodone and Latex   Social History   Socioeconomic History   Marital status: Legally Separated    Spouse name: Not on file   Number of children: 3   Years of education: Not on file   Highest education level: Some college, no degree  Occupational  History   Not on file  Tobacco Use   Smoking status: Never   Smokeless tobacco: Never  Vaping Use   Vaping Use: Never used  Substance and Sexual Activity   Alcohol use: Never   Drug use: Never   Sexual activity: Not on file  Other Topics Concern   Not on file  Social History Narrative   Lives alone   Social Determinants of Health   Financial Resource Strain: Not on file  Food Insecurity: Not on file  Transportation Needs: Not on file  Physical Activity: Not on file  Stress: Not on file  Social Connections: Not on file     Family History: The patient's family history  includes Cancer in her sister; Diabetes in her sister; Lupus in her sister; Ovarian cancer in her sister; Prostate cancer in her father.  ROS:   Please see the history of present illness.    .Review of Systems  Constitutional:  Positive for malaise/fatigue.  HENT:  Positive for hearing loss.   Eyes:  Negative for double vision.  Respiratory:  Positive for shortness of breath.   Cardiovascular:  Positive for chest pain and leg swelling. Negative for palpitations, orthopnea, claudication and PND.  Gastrointestinal:  Negative for nausea and vomiting.  Musculoskeletal:  Negative for falls.  Neurological:  Positive for dizziness. Negative for loss of consciousness.  Psychiatric/Behavioral:  The patient is nervous/anxious.     EKGs/Labs/Other Studies Reviewed:    The following studies were reviewed today: Myoview 2011:    Final interpretation: stress Myoview with no chest pain and no  electrocardiographic changes.  The scintigraphic results show  breast attenuation but no ischemia.  The gated ejection fraction  was 64% and wall motion was normal.   EKG:  EKG is  ordered today.  The ekg ordered today demonstrates NSR with HR 76  Recent Labs: 09/11/2020: BUN 18; Creatinine, Ser 1.15; Hemoglobin 11.9; Platelets 375; Potassium 3.8; Sodium 138  Recent Lipid Panel    Component Value Date/Time   CHOL (H) 05/12/2009 1513    220        ATP III CLASSIFICATION:  <200     mg/dL   Desirable  409-811  mg/dL   Borderline High  >=914    mg/dL   High          TRIG 782 (H) 05/12/2009 1513   HDL 41 05/12/2009 1513   CHOLHDL 5.4 05/12/2009 1513   VLDL 34 05/12/2009 1513   LDLCALC (H) 05/12/2009 1513    145        Total Cholesterol/HDL:CHD Risk Coronary Heart Disease Risk Table                     Men   Women  1/2 Average Risk   3.4   3.3  Average Risk       5.0   4.4  2 X Average Risk   9.6   7.1  3 X Average Risk  23.4   11.0        Use the calculated Patient Ratio above and the CHD Risk  Table to determine the patient's CHD Risk.        ATP III CLASSIFICATION (LDL):  <100     mg/dL   Optimal  956-213  mg/dL   Near or Above                    Optimal  130-159  mg/dL   Borderline  086-578  mg/dL   High  >035     mg/dL   Very High           Physical Exam:    VS:  BP 130/80    Pulse 76    Ht 5' 2.5" (1.588 m)    Wt 179 lb 3.2 oz (81.3 kg)    SpO2 98%    BMI 32.25 kg/m     Wt Readings from Last 3 Encounters:  04/21/21 179 lb 3.2 oz (81.3 kg)  12/05/20 187 lb 3.2 oz (84.9 kg)  09/11/20 186 lb (84.4 kg)     GEN:  Well nourished, well developed in no acute distress HEENT: Normal NECK: No JVD; No carotid bruits CARDIAC: RRR, no murmurs, rubs, gallops RESPIRATORY:  Clear to auscultation without rales, wheezing or rhonchi  ABDOMEN: Soft, non-tender, non-distended MUSCULOSKELETAL:  No edema; No deformity  SKIN: Warm and dry NEUROLOGIC:  Alert and oriented x 3 PSYCHIATRIC:  Normal affect   ASSESSMENT:    1. Precordial pain   2. Dyspnea on exertion   3. Primary hypertension   4. Mixed hyperlipidemia   5. Hair loss    PLAN:    In order of problems listed above:  #DOE: #Atypical Chest Pain: Has intermittent shooting pain in her left chest with associated SOB. Occurs about 2x/week and lasts several seconds before resolving. Symptoms are not exertional. While her chest pain does not sound like classic angina, she has been having progressive dyspnea on exertion requiring her to stop more frequently and rest. Given progressive symptoms, family history of premature CAD (sister with MI in 30s) and risk factors, will check coronary CTA for further evaluation -Check coronary CTA  #HTN: Controlled. -Continue amlodipine 2.5mg  daily  #HLD: -Continue prava 20mg  daily -Will adjust pending coronary CTA  #Hair Loss: TSH normal. Patient very concerned about this and we discussed the option of starting spironolactone to help. -Start spironolactone 25mg  daily -Check  BMET when she returns from vacation           Medication Adjustments/Labs and Tests Ordered: Current medicines are reviewed at length with the patient today.  Concerns regarding medicines are outlined above.  Orders Placed This Encounter  Procedures   CT CORONARY MORPH W/CTA COR W/SCORE W/CA W/CM &/OR WO/CM   Basic metabolic panel   EKG 12-Lead   Meds ordered this encounter  Medications   DISCONTD: metoprolol tartrate (LOPRESSOR) 100 MG tablet    Sig: Take 1 tablet (100 mg total) by mouth once for 1 dose. Take 90-120 minutes prior to scan.    Dispense:  1 tablet    Refill:  0   DISCONTD: spironolactone (ALDACTONE) 25 MG tablet    Sig: Take 1 tablet (25 mg total) by mouth daily.    Dispense:  90 tablet    Refill:  1   spironolactone (ALDACTONE) 25 MG tablet    Sig: Take 1 tablet (25 mg total) by mouth daily.    Dispense:  90 tablet    Refill:  1   metoprolol tartrate (LOPRESSOR) 100 MG tablet    Sig: Take 1 tablet (100 mg total) by mouth once for 1 dose. Take 90-120 minutes prior to scan.    Dispense:  1 tablet    Refill:  0    Patient Instructions  Medication Instructions:   START TAKING SPIRONOLACTONE 25 MG BY MOUTH DAILY  *If you need a refill on your cardiac medications before your next appointment, please call your pharmacy*   Testing/Procedures:  Your cardiac CT will be scheduled at one of the below locations:   [facility] [address] [place], [place] [address] [phone]  If scheduled at [facility], please arrive at the [facility] main entrance (entrance A) of [facility] 30 minutes prior to test start time. You can use the FREE valet parking offered at the main entrance (encouraged to control the heart rate for the test) Proceed to the [facility] Radiology Department (first floor) to check-in and test prep.  Please follow these instructions carefully (unless otherwise directed):  On the Night Before the  Test: Be sure to Drink plenty of water. Do not consume any caffeinated/decaffeinated beverages or chocolate 12 hours prior to your test. Do not take any antihistamines 12 hours prior to your test.   On the Day of the Test: Drink plenty of water until 1 hour prior to the test. Do not eat any food 4 hours prior to the test. You may take your regular medications prior to the test.  Take metoprolol 100 MG BY MOUTH (Lopressor) two hours prior to test. HOLD SPIRONOLACTONE THE MORNING OF THIS TEST FEMALES- please wear underwire-free bra if available, avoid dresses & tight clothing      After the Test: Drink plenty of water. After receiving IV contrast, you may experience a mild flushed feeling. This is normal. On occasion, you may experience a mild rash up to 24 hours after the test. This is not dangerous. If this occurs, you can take Benadryl 25 mg and increase your fluid intake. If you experience trouble breathing, this can be serious. If it is severe call 911 IMMEDIATELY. If it is mild, please call our office.  Please allow 2-4 weeks for scheduling of routine cardiac CTs. Some insurance companies require a pre-authorization which may delay scheduling of this test.   For non-scheduling related questions, please contact the cardiac imaging nurse navigator should you have any questions/concerns: Rockwell Alexandria, Cardiac Imaging Nurse Navigator Larey Brick, Cardiac Imaging Nurse Navigator [facility] Direct Office Dial: [phone]   For scheduling needs, including cancellations and rescheduling, please call [place], [phone].   Follow-Up: At [facility], you and your health needs are our priority.  As part of our continuing mission to provide you with exceptional heart care, we have created designated Provider Care Teams.  These Care Teams include your primary Cardiologist (physician) and Advanced Practice Providers (APPs -  Physician Assistants and Nurse  Practitioners) who all work together to provide you with the care you need, when you need it.  We recommend signing up for the patient portal called "MyChart".  Sign up information is provided on this After Visit Summary.  MyChart is used to connect with patients for Virtual Visits (Telemedicine).  Patients are able to view lab/test results, encounter notes, upcoming appointments, etc.  Non-urgent messages can be sent to your provider as well.   To learn more about what you can do with MyChart, go to [email or web address].    Your next appointment:   6 month(s)  The format for your next appointment:   In Person  Provider:   DR. Shari Prows    Signed, Meriam Sprague, MD  04/21/2021 11:46 AM    [facility]

## 2021-04-21 ENCOUNTER — Encounter (INDEPENDENT_AMBULATORY_CARE_PROVIDER_SITE_OTHER): Payer: Self-pay

## 2021-04-21 ENCOUNTER — Ambulatory Visit: Payer: Medicaid Other | Admitting: Cardiology

## 2021-04-21 ENCOUNTER — Other Ambulatory Visit: Payer: Self-pay

## 2021-04-21 ENCOUNTER — Encounter: Payer: Self-pay | Admitting: Cardiology

## 2021-04-21 VITALS — BP 130/80 | HR 76 | Ht 62.5 in | Wt 179.2 lb

## 2021-04-21 DIAGNOSIS — I10 Essential (primary) hypertension: Secondary | ICD-10-CM

## 2021-04-21 DIAGNOSIS — E782 Mixed hyperlipidemia: Secondary | ICD-10-CM

## 2021-04-21 DIAGNOSIS — R0609 Other forms of dyspnea: Secondary | ICD-10-CM

## 2021-04-21 DIAGNOSIS — L659 Nonscarring hair loss, unspecified: Secondary | ICD-10-CM

## 2021-04-21 DIAGNOSIS — R072 Precordial pain: Secondary | ICD-10-CM

## 2021-04-21 MED ORDER — SPIRONOLACTONE 25 MG PO TABS: 25.0000 mg | ORAL_TABLET | Freq: Every day | ORAL | 1 refills | Status: DC

## 2021-04-21 MED ORDER — METOPROLOL TARTRATE 100 MG PO TABS: 100.0000 mg | ORAL_TABLET | Freq: Once | ORAL | 0 refills | Status: DC

## 2021-04-21 NOTE — Patient Instructions (Signed)
Medication Instructions:   START TAKING SPIRONOLACTONE 25 MG BY MOUTH DAILY  *If you need a refill on your cardiac medications before your next appointment, please call your pharmacy*   Testing/Procedures:    Your cardiac CT will be scheduled at one of the below locations:   [facility] [address] [place], [place] [address] [phone]  If scheduled at [facility], please arrive at the [facility] main entrance (entrance A) of [facility] 30 minutes prior to test start time. You can use the FREE valet parking offered at the main entrance (encouraged to control the heart rate for the test) Proceed to the [facility] Radiology Department (first floor) to check-in and test prep.  Please follow these instructions carefully (unless otherwise directed):  On the Night Before the Test: Be sure to Drink plenty of water. Do not consume any caffeinated/decaffeinated beverages or chocolate 12 hours prior to your test. Do not take any antihistamines 12 hours prior to your test.   On the Day of the Test: Drink plenty of water until 1 hour prior to the test. Do not eat any food 4 hours prior to the test. You may take your regular medications prior to the test.  Take metoprolol 100 MG BY MOUTH (Lopressor) two hours prior to test. HOLD SPIRONOLACTONE THE MORNING OF THIS TEST FEMALES- please wear underwire-free bra if available, avoid dresses & tight clothing      After the Test: Drink plenty of water. After receiving IV contrast, you may experience a mild flushed feeling. This is normal. On occasion, you may experience a mild rash up to 24 hours after the test. This is not dangerous. If this occurs, you can take Benadryl 25 mg and increase your fluid intake. If you experience trouble breathing, this can be serious. If it is severe call 911 IMMEDIATELY. If it is mild, please call our office.  Please allow 2-4 weeks for scheduling of routine cardiac CTs.  Some insurance companies require a pre-authorization which may delay scheduling of this test.   For non-scheduling related questions, please contact the cardiac imaging nurse navigator should you have any questions/concerns: Rockwell Alexandria, Cardiac Imaging Nurse Navigator Larey Brick, Cardiac Imaging Nurse Navigator [place] [facility] Direct Office Dial: [phone]   For scheduling needs, including cancellations and rescheduling, please call [place], [phone].   Follow-Up: At [facility], you and your health needs are our priority.  As part of our continuing mission to provide you with exceptional heart care, we have created designated Provider Care Teams.  These Care Teams include your primary Cardiologist (physician) and Advanced Practice Providers (APPs -  Physician Assistants and Nurse Practitioners) who all work together to provide you with the care you need, when you need it.  We recommend signing up for the patient portal called "MyChart".  Sign up information is provided on this After Visit Summary.  MyChart is used to connect with patients for Virtual Visits (Telemedicine).  Patients are able to view lab/test results, encounter notes, upcoming appointments, etc.  Non-urgent messages can be sent to your provider as well.   To learn more about what you can do with MyChart, go to [email or web address].    Your next appointment:   6 month(s)  The format for your next appointment:   In Person  Provider:   DR. Shari Prows

## 2021-04-29 ENCOUNTER — Emergency Department (HOSPITAL_BASED_OUTPATIENT_CLINIC_OR_DEPARTMENT_OTHER): Payer: Medicaid Other

## 2021-04-29 ENCOUNTER — Emergency Department (HOSPITAL_BASED_OUTPATIENT_CLINIC_OR_DEPARTMENT_OTHER)
Admission: EM | Admit: 2021-04-29 | Discharge: 2021-04-29 | Disposition: A | Discharge status: Home / Self Care | Payer: Medicaid Other | Attending: Emergency Medicine | Admitting: Emergency Medicine

## 2021-04-29 ENCOUNTER — Encounter (HOSPITAL_BASED_OUTPATIENT_CLINIC_OR_DEPARTMENT_OTHER): Payer: Self-pay | Admitting: *Deleted

## 2021-04-29 DIAGNOSIS — Z7982 Long term (current) use of aspirin: Secondary | ICD-10-CM | POA: Insufficient documentation

## 2021-04-29 DIAGNOSIS — Z79899 Other long term (current) drug therapy: Secondary | ICD-10-CM | POA: Diagnosis not present

## 2021-04-29 DIAGNOSIS — I10 Essential (primary) hypertension: Secondary | ICD-10-CM | POA: Diagnosis not present

## 2021-04-29 DIAGNOSIS — R0789 Other chest pain: Secondary | ICD-10-CM | POA: Diagnosis not present

## 2021-04-29 DIAGNOSIS — I2510 Atherosclerotic heart disease of native coronary artery without angina pectoris: Secondary | ICD-10-CM | POA: Diagnosis not present

## 2021-04-29 DIAGNOSIS — Z91040 Latex allergy status: Secondary | ICD-10-CM | POA: Insufficient documentation

## 2021-04-29 DIAGNOSIS — R0602 Shortness of breath: Secondary | ICD-10-CM | POA: Insufficient documentation

## 2021-04-29 DIAGNOSIS — R079 Chest pain, unspecified: Secondary | ICD-10-CM | POA: Diagnosis present

## 2021-04-29 LAB — CBC WITH DIFFERENTIAL/PLATELET
Abs Immature Granulocytes: 0.01 10*3/uL (ref 0.00–0.07)
Basophils Absolute: 0.1 10*3/uL (ref 0.0–0.1)
Basophils Relative: 1 %
Eosinophils Absolute: 0.1 10*3/uL (ref 0.0–0.5)
Eosinophils Relative: 2 %
HCT: 34.6 % — ABNORMAL LOW (ref 36.0–46.0)
Hemoglobin: 11.5 g/dL — ABNORMAL LOW (ref 12.0–15.0)
Immature Granulocytes: 0 %
Lymphocytes Relative: 24 %
Lymphs Abs: 1.4 10*3/uL (ref 0.7–4.0)
MCH: 32.1 pg (ref 26.0–34.0)
MCHC: 33.2 g/dL (ref 30.0–36.0)
MCV: 96.6 fL (ref 80.0–100.0)
Monocytes Absolute: 0.6 10*3/uL (ref 0.1–1.0)
Monocytes Relative: 10 %
Neutro Abs: 3.6 10*3/uL (ref 1.7–7.7)
Neutrophils Relative %: 63 %
Platelets: 357 10*3/uL (ref 150–400)
RBC: 3.58 MIL/uL — ABNORMAL LOW (ref 3.87–5.11)
RDW: 13.8 % (ref 11.5–15.5)
WBC: 5.8 10*3/uL (ref 4.0–10.5)
nRBC: 0 % (ref 0.0–0.2)

## 2021-04-29 LAB — TROPONIN I (HIGH SENSITIVITY)
Troponin I (High Sensitivity): 3 ng/L (ref <18)
Troponin I (High Sensitivity): 3 ng/L (ref <18)

## 2021-04-29 LAB — COMPREHENSIVE METABOLIC PANEL
ALT: 18 U/L (ref 0–44)
AST: 21 U/L (ref 15–41)
Albumin: 3.8 g/dL (ref 3.5–5.0)
Alkaline Phosphatase: 70 U/L (ref 38–126)
Anion gap: 10 (ref 5–15)
BUN: 15 mg/dL (ref 6–20)
CO2: 27 mmol/L (ref 22–32)
Calcium: 8.8 mg/dL — ABNORMAL LOW (ref 8.9–10.3)
Chloride: 101 mmol/L (ref 98–111)
Creatinine, Ser: 1.02 mg/dL — ABNORMAL HIGH (ref 0.44–1.00)
GFR, Estimated: >60 mL/min (ref >60)
Glucose, Bld: 101 mg/dL — ABNORMAL HIGH (ref 70–99)
Potassium: 4 mmol/L (ref 3.5–5.1)
Sodium: 138 mmol/L (ref 135–145)
Total Bilirubin: 0.5 mg/dL (ref 0.3–1.2)
Total Protein: 7.3 g/dL (ref 6.5–8.1)

## 2021-04-29 IMAGING — DX DG CHEST 2V
2 series · 2 of 2 positions shown · non-contrast
Comparison: [DATE]

CLINICAL DATA: Chest pain since last night. History of
palpitations.

EXAM:
CHEST - 2 VIEW

[chest pa]
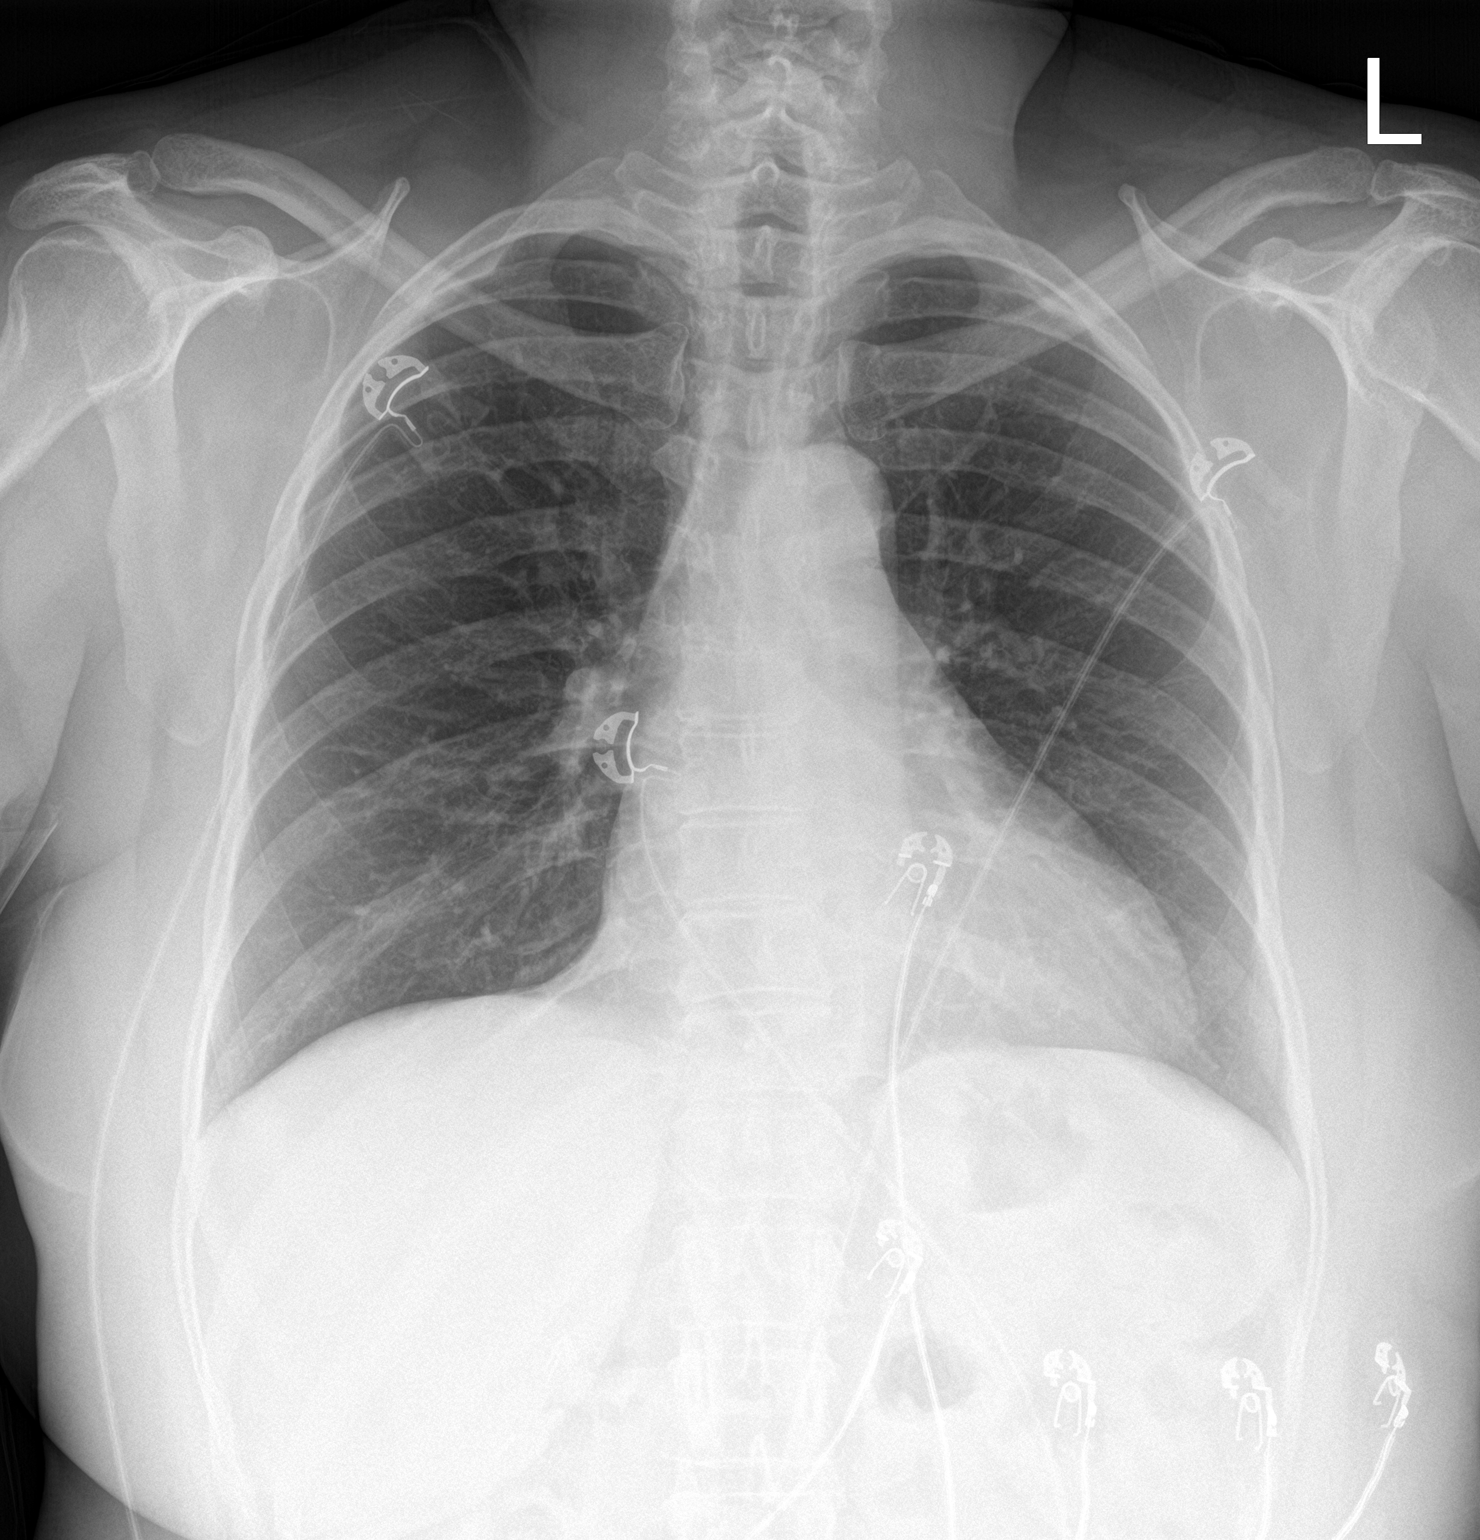

[chest lat]
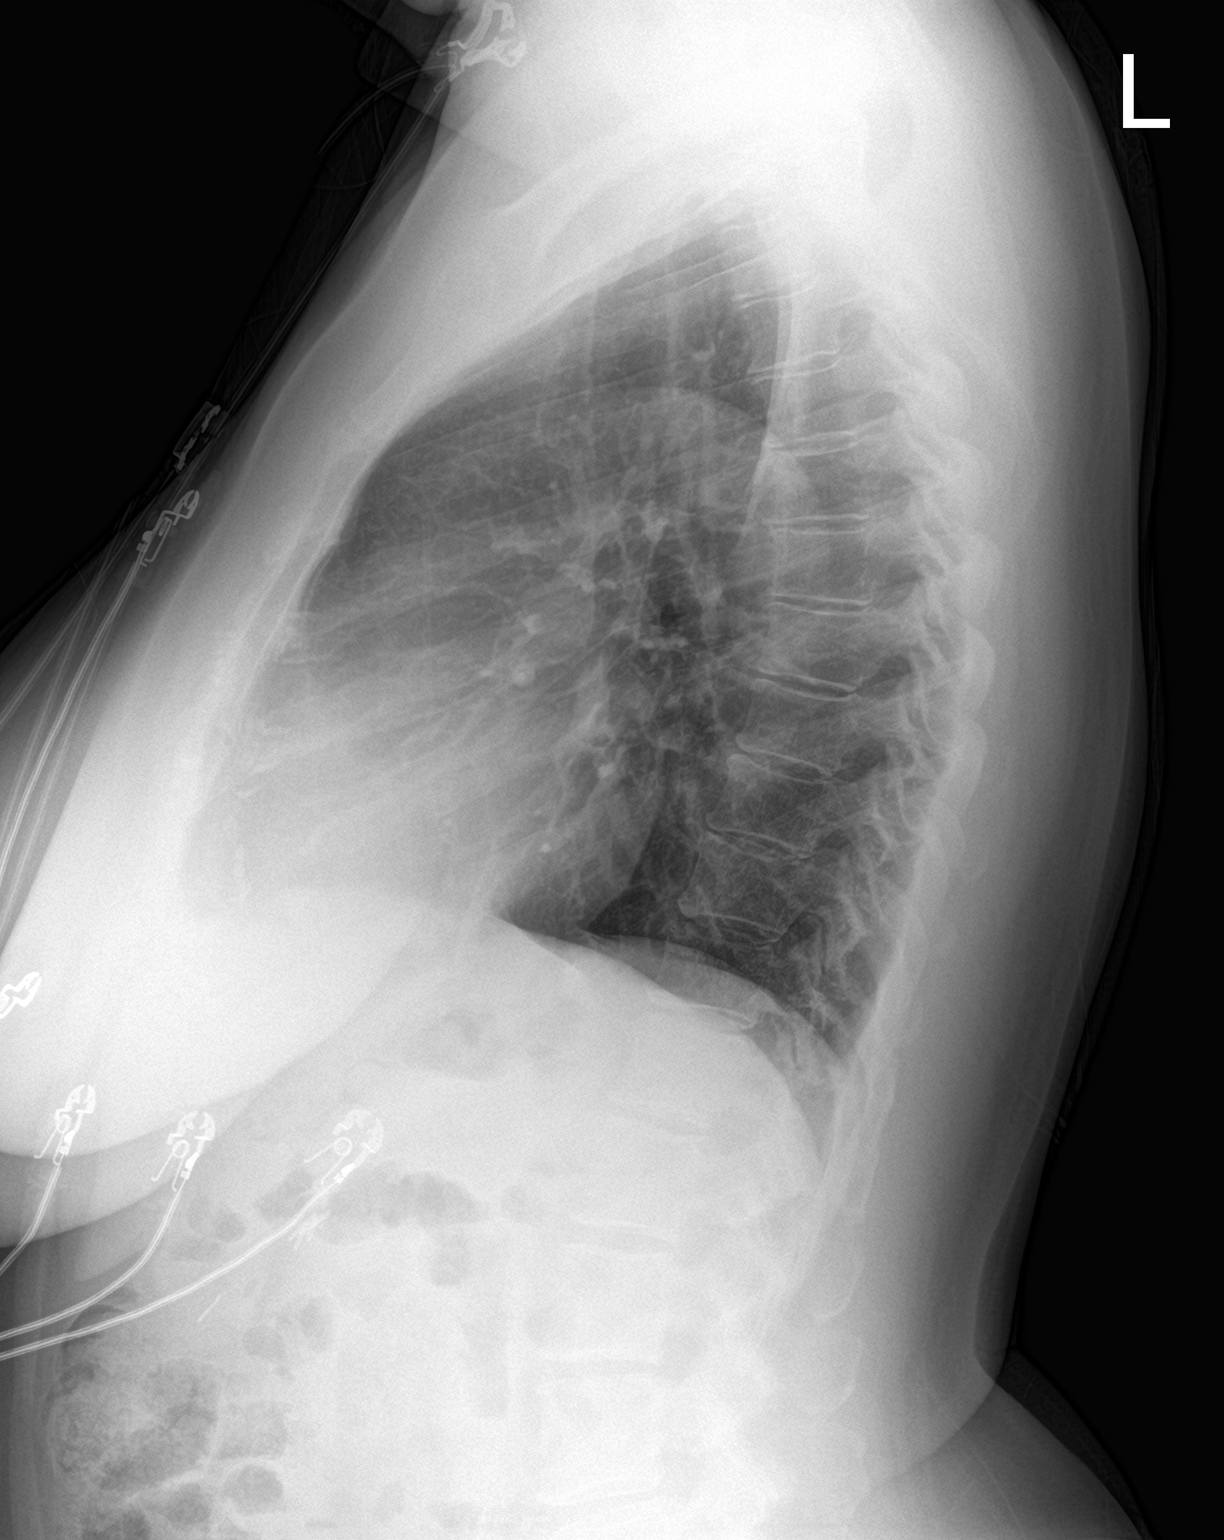

[2 of 2 positions shown; findings below may reference images not displayed]

FINDINGS: Normal sized heart. Clear lungs with normal vascularity.
Unremarkable bones.
IMPRESSION: No acute abnormality.

## 2021-04-29 NOTE — ED Provider Notes (Signed)
[facility] EMERGENCY DEPARTMENT Provider Note    CSN: [id number] Arrival date & time: 04/29/21 0740  History Chief Complaint  Patient presents with   Chest Pain    Kathleen Foley is a 56 y.o. female with history of HTN, HLD and family history of CAD has had intermittent chest pains for several months. She was initially seen in the ED May 2022 and referred to Cardiology but her first appointment with them was 04/21/21. She was scheduled for a Coronary CTA to be done later this month. During the night last night while lying on her right side she began to have a left sided chest pain she is not able to describe, she turned over onto her left side and pain seemed to improve. Minimal pain at the time of my evaluation. She was having some SOB during that time as well. No recent cough or fever.    Home Medications Prior to Admission medications   Medication Sig Start Date End Date Taking? Authorizing Provider  albuterol (PROVENTIL) (2.5 MG/3ML) 0.083% nebulizer solution Inhale into the lungs. 05/03/19   [provider]  amLODipine (NORVASC) 2.5 MG tablet TAKE ONE TABLET IN THE MORNING 11/12/20   [provider]  budesonide-formoterol (SYMBICORT) 160-4.5 MCG/ACT inhaler Inhale 2 puffs into the lungs 2 (two) times daily. Patient not taking: Reported on 04/21/2021 08/01/18   [provider]  cetirizine (ZYRTEC) 10 MG tablet TAKE ONE TABLET IN THE MORNING 11/12/20   [provider]  Cholecalciferol 50 MCG (2000 UT) CAPS Take by mouth daily. Patient not taking: Reported on 04/21/2021 02/15/18   [provider]  dexlansoprazole (DEXILANT) 60 MG capsule Take 1 capsule by mouth every morning. 11/12/20   [provider]  escitalopram (LEXAPRO) 20 MG tablet TAKE ONE TABLET IN THE MORNING 09/16/20   [provider]  gabapentin (NEURONTIN) 300 MG capsule SMARTSIG:1 Capsule(s) By Mouth Every Evening 11/12/20   [provider]  GOODSENSE  ASPIRIN LOW DOSE 81 MG EC tablet TAKE ONE TABLET IN THE MORNING 10/13/20   [provider]  meloxicam (MOBIC) 15 MG tablet TAKE ONE TABLET IN THE MORNING 11/12/20   [provider]  metoprolol tartrate (LOPRESSOR) 100 MG tablet Take 1 tablet (100 mg total) by mouth once for 1 dose. Take 90-120 minutes prior to scan. 04/21/21 04/21/21  Meriam Sprague, MD  montelukast (SINGULAIR) 10 MG tablet SMARTSIG:1 Tablet(s) By Mouth Every Evening 11/12/20   [provider]  oxybutynin (DITROPAN-XL) 10 MG 24 hr tablet TAKE ONE TABLET IN THE MORNING 10/14/20   [provider]  pravastatin (PRAVACHOL) 20 MG tablet SMARTSIG:1 Tablet(s) By Mouth Every Evening 11/12/20   [provider]  Prenatal Vit-Fe Fumarate-FA (PRENATAL MULTIVITAMIN) TABS tablet Take 1 tablet by mouth daily at 12 noon.    [provider]  RESTASIS 0.05 % ophthalmic emulsion 1 drop 2 (two) times daily. 11/12/20   [provider]  spironolactone (ALDACTONE) 25 MG tablet Take 1 tablet (25 mg total) by mouth daily. 04/21/21   Meriam Sprague, MD     Allergies    Hydrocodone and Latex   Review of Systems   Review of Systems Please see HPI for pertinent positives and negatives  Physical Exam BP (!) 137/98 (BP Location: Right Arm)    Pulse 74    Temp 98.6 F (37 C) (Oral)    Resp 18    Ht 5\' 3"  (1.6 m)    Wt 84 kg  SpO2 98%    BMI 32.80 kg/m   Physical Exam Vitals and nursing note reviewed.  Constitutional:      Appearance: Normal appearance.  HENT:     Head: Normocephalic and atraumatic.     Nose: Nose normal.     Mouth/Throat:     Mouth: Mucous membranes are moist.  Eyes:     Extraocular Movements: Extraocular movements intact.     Conjunctiva/sclera: Conjunctivae normal.  Cardiovascular:     Rate and Rhythm: Normal rate.  Pulmonary:     Effort: Pulmonary effort is normal.     Breath sounds: Normal breath sounds.  Chest:     Chest wall: No tenderness.   Abdominal:     General: Abdomen is flat.     Palpations: Abdomen is soft.     Tenderness: There is no abdominal tenderness.  Musculoskeletal:        General: No swelling. Normal range of motion.     Cervical back: Neck supple.  Skin:    General: Skin is warm and dry.  Neurological:     General: No focal deficit present.     Mental Status: She is alert.  Psychiatric:        Mood and Affect: Mood normal.    ED Results / Procedures / Treatments   EKG EKG Interpretation  Date/Time:  Wednesday April 29 2021 07:52:59 EST Ventricular Rate:  72 PR Interval:  158 QRS Duration: 77 QT Interval:  496 QTC Calculation: 543 R Axis:   64 Text Interpretation: Sinus rhythm Left atrial enlargement Borderline T abnormalities, anterior leads Prolonged QT interval Since last tracing QT has lengthened Otherwise no significant change Confirmed by Susy Frizzle [phone]) on 04/29/2021 8:17:29 AM  Procedures Procedures  Medications Ordered in the ED Medications - No data to display  Initial Impression and Plan  Patient with atypical chest pain, had an episode during the night. Already scheduled for Coronary CTA. Currently well appearing. Will check labs, EKG and CXR.   ED Course   Clinical Course as of 04/29/21 1105  Wed Apr 29, 2021  0822 CBC with mild anemia, at baseline.  [CS]  0826 CMP is unremarkable.  [CS]  0836 Trop #1 is normal.  [CS]  0851 CXR is clear [CS]  1102 Delta trop is negative. Patient is resting comfortably. Plan discharge with continued outpatient management by Cardiology.  [CS]    Clinical Course User Index [CS] Pollyann Savoy, MD     MDM Rules/Calculators/A&P Medical Decision Making Problems Addressed: Atypical chest pain: acute illness or injury  Amount and/or Complexity of Data Reviewed External Data Reviewed: labs, radiology and notes. Labs: ordered. Decision-making details documented in ED Course. Radiology: ordered and independent interpretation  performed. Decision-making details documented in ED Course. ECG/medicine tests: ordered and independent interpretation performed. Decision-making details documented in ED Course.    Final Clinical Impression(s) / ED Diagnoses Final diagnoses:  Atypical chest pain    Rx / DC Orders ED Discharge Orders     None        Pollyann Savoy, MD 04/29/21 1105

## 2021-04-29 NOTE — ED Triage Notes (Signed)
Last night began having left breast pain. Had some shortness of breath, denies any dizziness or light headiness, denies nausea or vomiting as well. Denies currently having any chest pain. Has seen a Cardiologist for previous chest pain episodes, states she is scheduled for at CT scan.

## 2021-04-29 NOTE — ED Notes (Signed)
ED Provider at bedside. 

## 2021-04-29 NOTE — ED Notes (Signed)
ECG obtained and to the ED MD for review

## 2021-05-02 ENCOUNTER — Emergency Department (HOSPITAL_BASED_OUTPATIENT_CLINIC_OR_DEPARTMENT_OTHER)
Admission: EM | Admit: 2021-05-02 | Discharge: 2021-05-02 | Disposition: A | Discharge status: Home / Self Care | Payer: Medicaid Other | Attending: Emergency Medicine | Admitting: Emergency Medicine

## 2021-05-02 ENCOUNTER — Emergency Department (HOSPITAL_BASED_OUTPATIENT_CLINIC_OR_DEPARTMENT_OTHER): Payer: Medicaid Other

## 2021-05-02 ENCOUNTER — Encounter (HOSPITAL_BASED_OUTPATIENT_CLINIC_OR_DEPARTMENT_OTHER): Payer: Self-pay | Admitting: Emergency Medicine

## 2021-05-02 ENCOUNTER — Other Ambulatory Visit: Payer: Self-pay

## 2021-05-02 DIAGNOSIS — R9431 Abnormal electrocardiogram [ECG] [EKG]: Secondary | ICD-10-CM | POA: Diagnosis not present

## 2021-05-02 DIAGNOSIS — R197 Diarrhea, unspecified: Secondary | ICD-10-CM | POA: Diagnosis not present

## 2021-05-02 DIAGNOSIS — Z79899 Other long term (current) drug therapy: Secondary | ICD-10-CM | POA: Insufficient documentation

## 2021-05-02 DIAGNOSIS — R1031 Right lower quadrant pain: Secondary | ICD-10-CM | POA: Insufficient documentation

## 2021-05-02 DIAGNOSIS — Z91040 Latex allergy status: Secondary | ICD-10-CM | POA: Diagnosis not present

## 2021-05-02 DIAGNOSIS — R1013 Epigastric pain: Secondary | ICD-10-CM | POA: Insufficient documentation

## 2021-05-02 DIAGNOSIS — Z7982 Long term (current) use of aspirin: Secondary | ICD-10-CM | POA: Diagnosis not present

## 2021-05-02 DIAGNOSIS — R1030 Lower abdominal pain, unspecified: Secondary | ICD-10-CM

## 2021-05-02 LAB — BASIC METABOLIC PANEL
Anion gap: 8 (ref 5–15)
BUN: 15 mg/dL (ref 6–20)
CO2: 26 mmol/L (ref 22–32)
Calcium: 9.1 mg/dL (ref 8.9–10.3)
Chloride: 102 mmol/L (ref 98–111)
Creatinine, Ser: 1.07 mg/dL — ABNORMAL HIGH (ref 0.44–1.00)
GFR, Estimated: >60 mL/min (ref >60)
Glucose, Bld: 94 mg/dL (ref 70–99)
Potassium: 4 mmol/L (ref 3.5–5.1)
Sodium: 136 mmol/L (ref 135–145)

## 2021-05-02 LAB — HEPATIC FUNCTION PANEL
ALT: 28 U/L (ref 0–44)
AST: 29 U/L (ref 15–41)
Albumin: 4 g/dL (ref 3.5–5.0)
Alkaline Phosphatase: 70 U/L (ref 38–126)
Bilirubin, Direct: <0.1 mg/dL (ref 0.0–0.2)
Total Bilirubin: 0.7 mg/dL (ref 0.3–1.2)
Total Protein: 7.6 g/dL (ref 6.5–8.1)

## 2021-05-02 LAB — CBC
HCT: 34.1 % — ABNORMAL LOW (ref 36.0–46.0)
Hemoglobin: 11.1 g/dL — ABNORMAL LOW (ref 12.0–15.0)
MCH: 30.7 pg (ref 26.0–34.0)
MCHC: 32.6 g/dL (ref 30.0–36.0)
MCV: 94.5 fL (ref 80.0–100.0)
Platelets: 335 10*3/uL (ref 150–400)
RBC: 3.61 MIL/uL — ABNORMAL LOW (ref 3.87–5.11)
RDW: 13.5 % (ref 11.5–15.5)
WBC: 8.5 10*3/uL (ref 4.0–10.5)
nRBC: 0 % (ref 0.0–0.2)

## 2021-05-02 LAB — TROPONIN I (HIGH SENSITIVITY): Troponin I (High Sensitivity): 2 ng/L (ref <18)

## 2021-05-02 LAB — LIPASE, BLOOD: Lipase: 51 U/L (ref 11–51)

## 2021-05-02 IMAGING — CT CT ABD-PELV W/ CM
2 of 5 series · 17 of 46 positions shown, 19 images · IV contrast (Omnipaque)
Comparison: CT [DATE]

CLINICAL DATA: Epigastric pain and diarrhea for 5 days

EXAM:
CT ABDOMEN AND PELVIS WITH CONTRAST
TECHNIQUE: Multidetector CT imaging of the abdomen and pelvis was performed
using the standard protocol following bolus administration of
intravenous contrast.

[Series 2: axial st · axial · 0.94mm/px · z∈[-418,-58]mm · 14 of 84 slices shown, 16 images]
[im 6/84  soft-tissue]
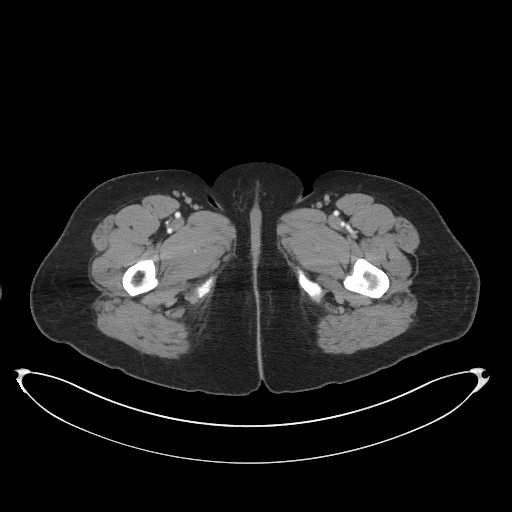
[im 6/84  bone]
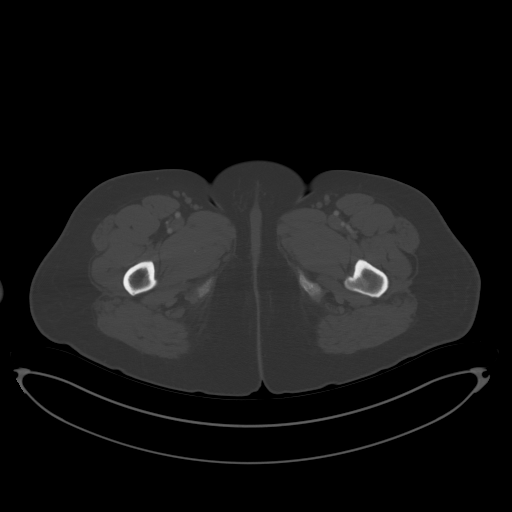
[im 11/84  soft-tissue]
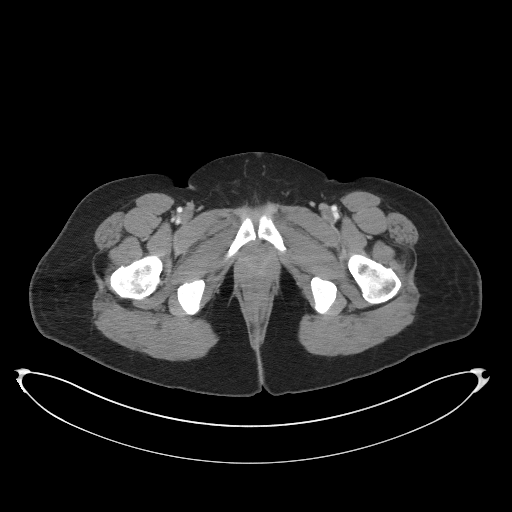
[im 16/84  soft-tissue]
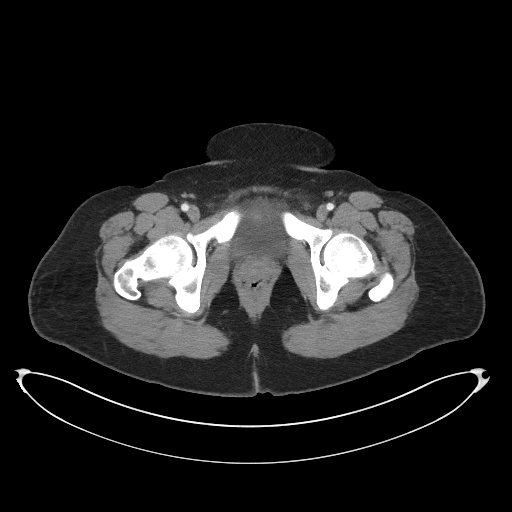
[im 21/84  soft-tissue]
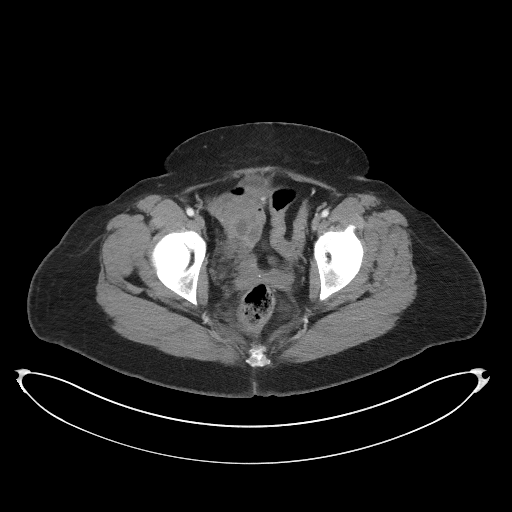
[im 26/84  soft-tissue]
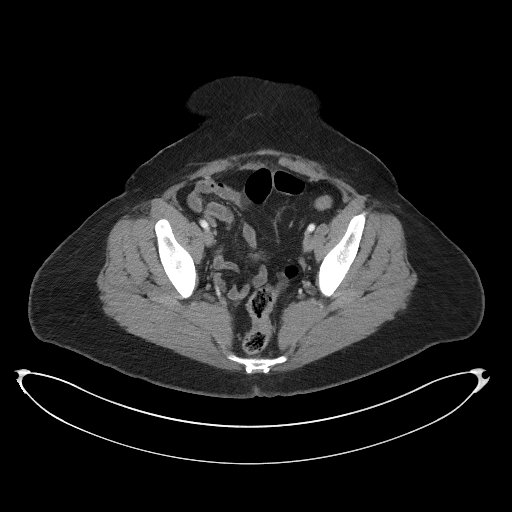
[im 32/84  soft-tissue]
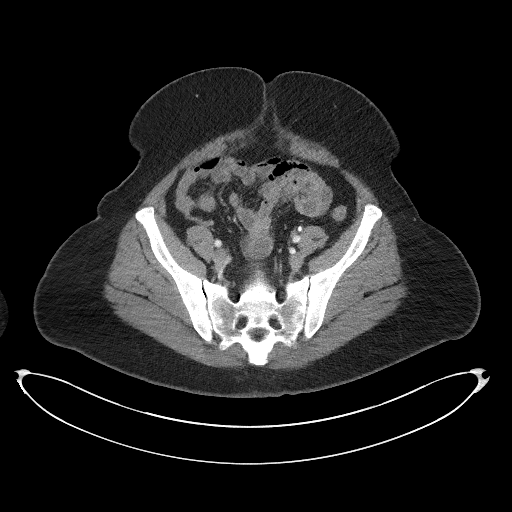
[im 37/84  soft-tissue]
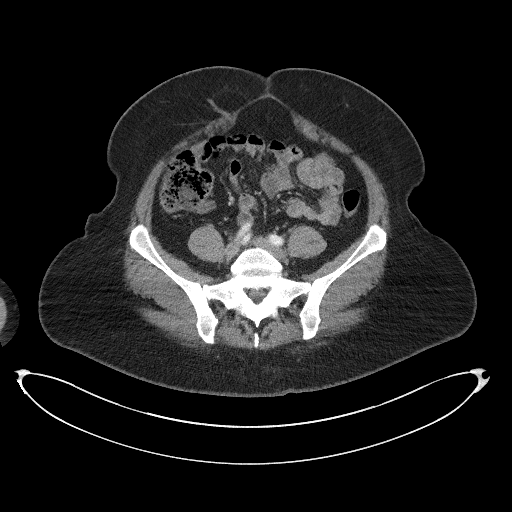
[im 47/84  soft-tissue]
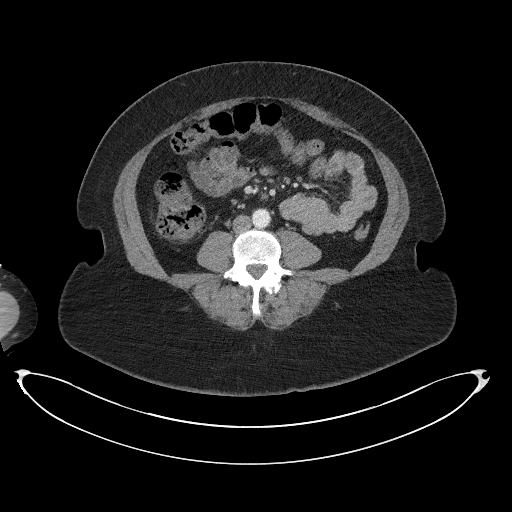
[im 52/84  soft-tissue]
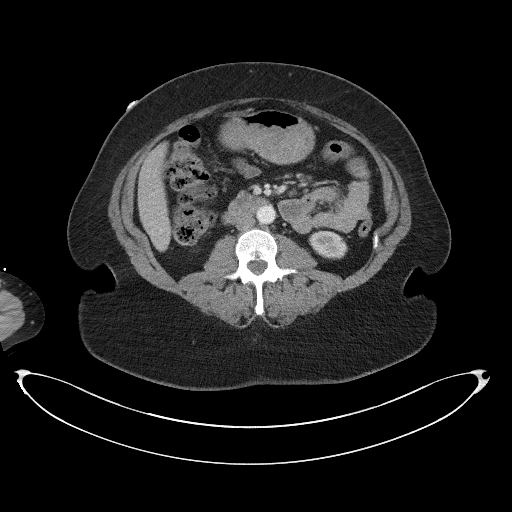
[im 52/84  bone]
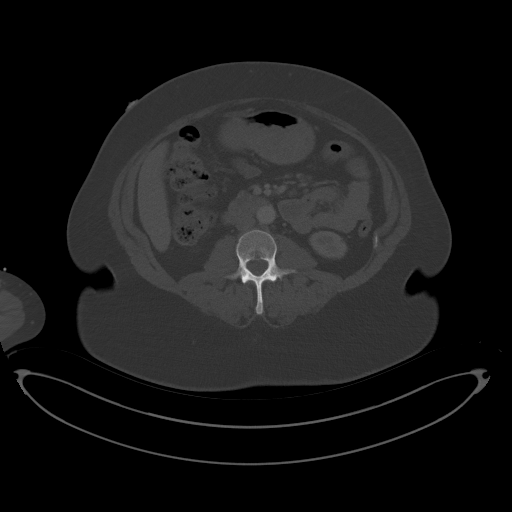
[im 58/84  soft-tissue]
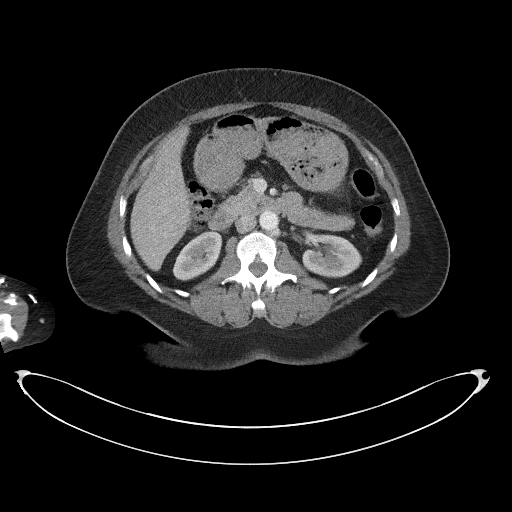
[im 63/84  soft-tissue]
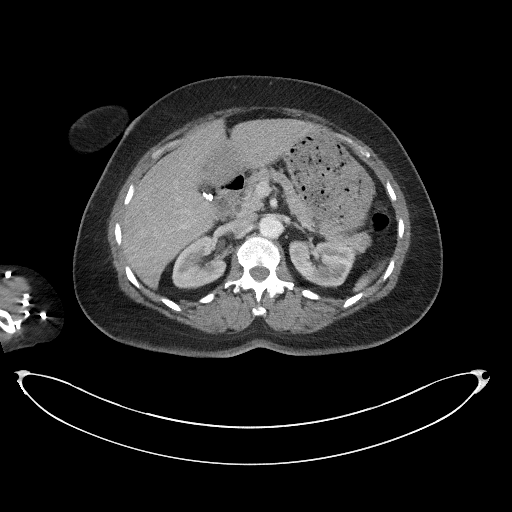
[im 68/84  soft-tissue]
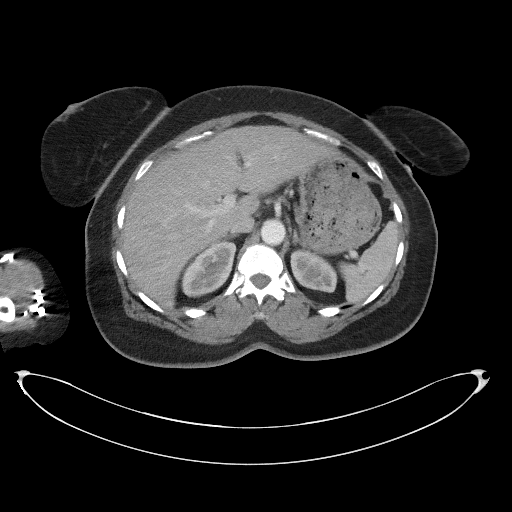
[im 73/84  soft-tissue]
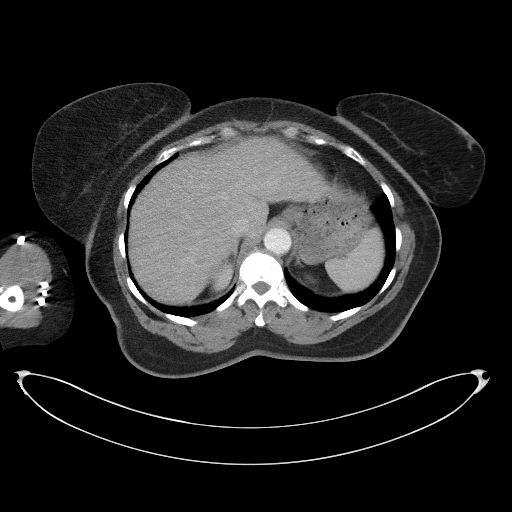
[im 78/84  soft-tissue]
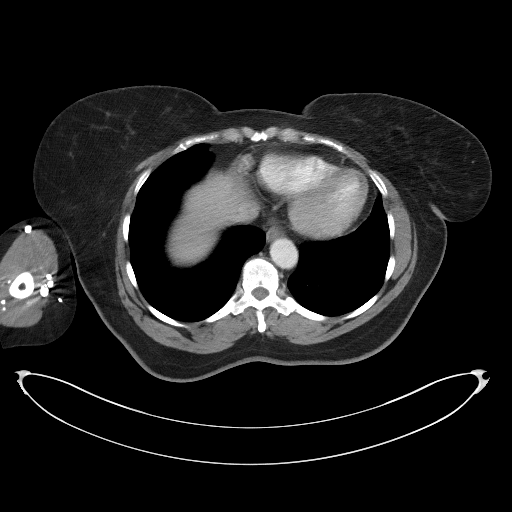

[Series 5: coronal st · coronal · 0.81mm/px · 3 of 116 slices shown]
[im 39/116  soft-tissue]
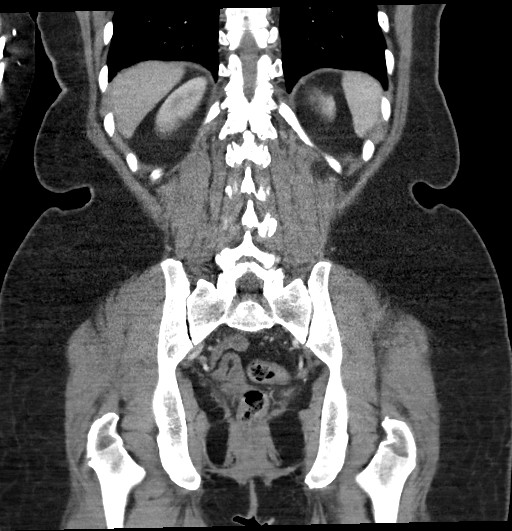
[im 52/116  soft-tissue]
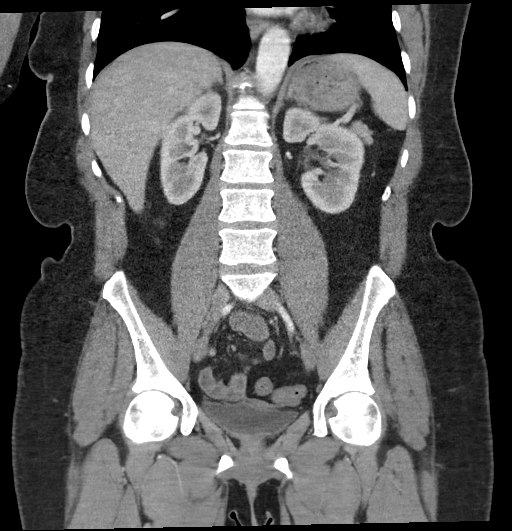
[im 64/116  soft-tissue]
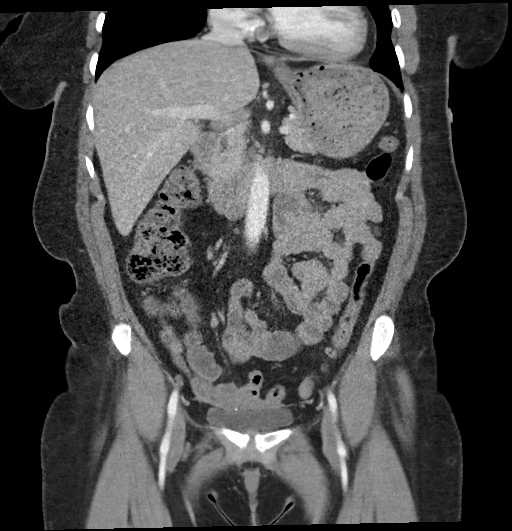

[17 of 46 positions shown; findings below may reference images not displayed]

RADIATION DOSE REDUCTION: This exam was performed according to the
departmental dose-optimization program which includes automated
exposure control, adjustment of the mA and/or kV according to
patient size and/or use of iterative reconstruction technique.

CONTRAST:  100mL OMNIPAQUE IOHEXOL 300 MG/ML  SOLN
FINDINGS: Lower chest: No acute abnormality.

Hepatobiliary: Subcentimeter low-density lesion at the left hepatic
dome, too small to definitively characterize. Liver is otherwise
unremarkable in appearance. Status post cholecystectomy. No biliary
dilatation.

Pancreas: Unremarkable. No pancreatic ductal dilatation or
surrounding inflammatory changes.

Spleen: Normal in size without focal abnormality.

Adrenals/Urinary Tract: Unremarkable adrenal glands. Kidneys enhance
symmetrically without focal lesion, stone, or hydronephrosis.
Ureters are nondilated. Urinary bladder appears unremarkable.

Stomach/Bowel: Stomach is within normal limits. Appendix appears
normal. No evidence of bowel wall thickening, distention, or
inflammatory changes.

Vascular/Lymphatic: No significant vascular findings are present.
Circumaortic left renal vein, an anatomic variant. No enlarged
abdominal or pelvic lymph nodes.

Reproductive: Status post hysterectomy. No adnexal masses.

Other: No free fluid. No abdominopelvic fluid collection. No
pneumoperitoneum. Rectus diastasis.

Musculoskeletal: No acute or significant osseous findings.
IMPRESSION: No acute abdominopelvic findings.

## 2021-05-02 IMAGING — DX DG CHEST 2V
2 series · 2 of 2 positions shown · non-contrast
Comparison: [DATE]

CLINICAL DATA: Epigastric pain.

EXAM:
CHEST - 2 VIEW

[chest pa]
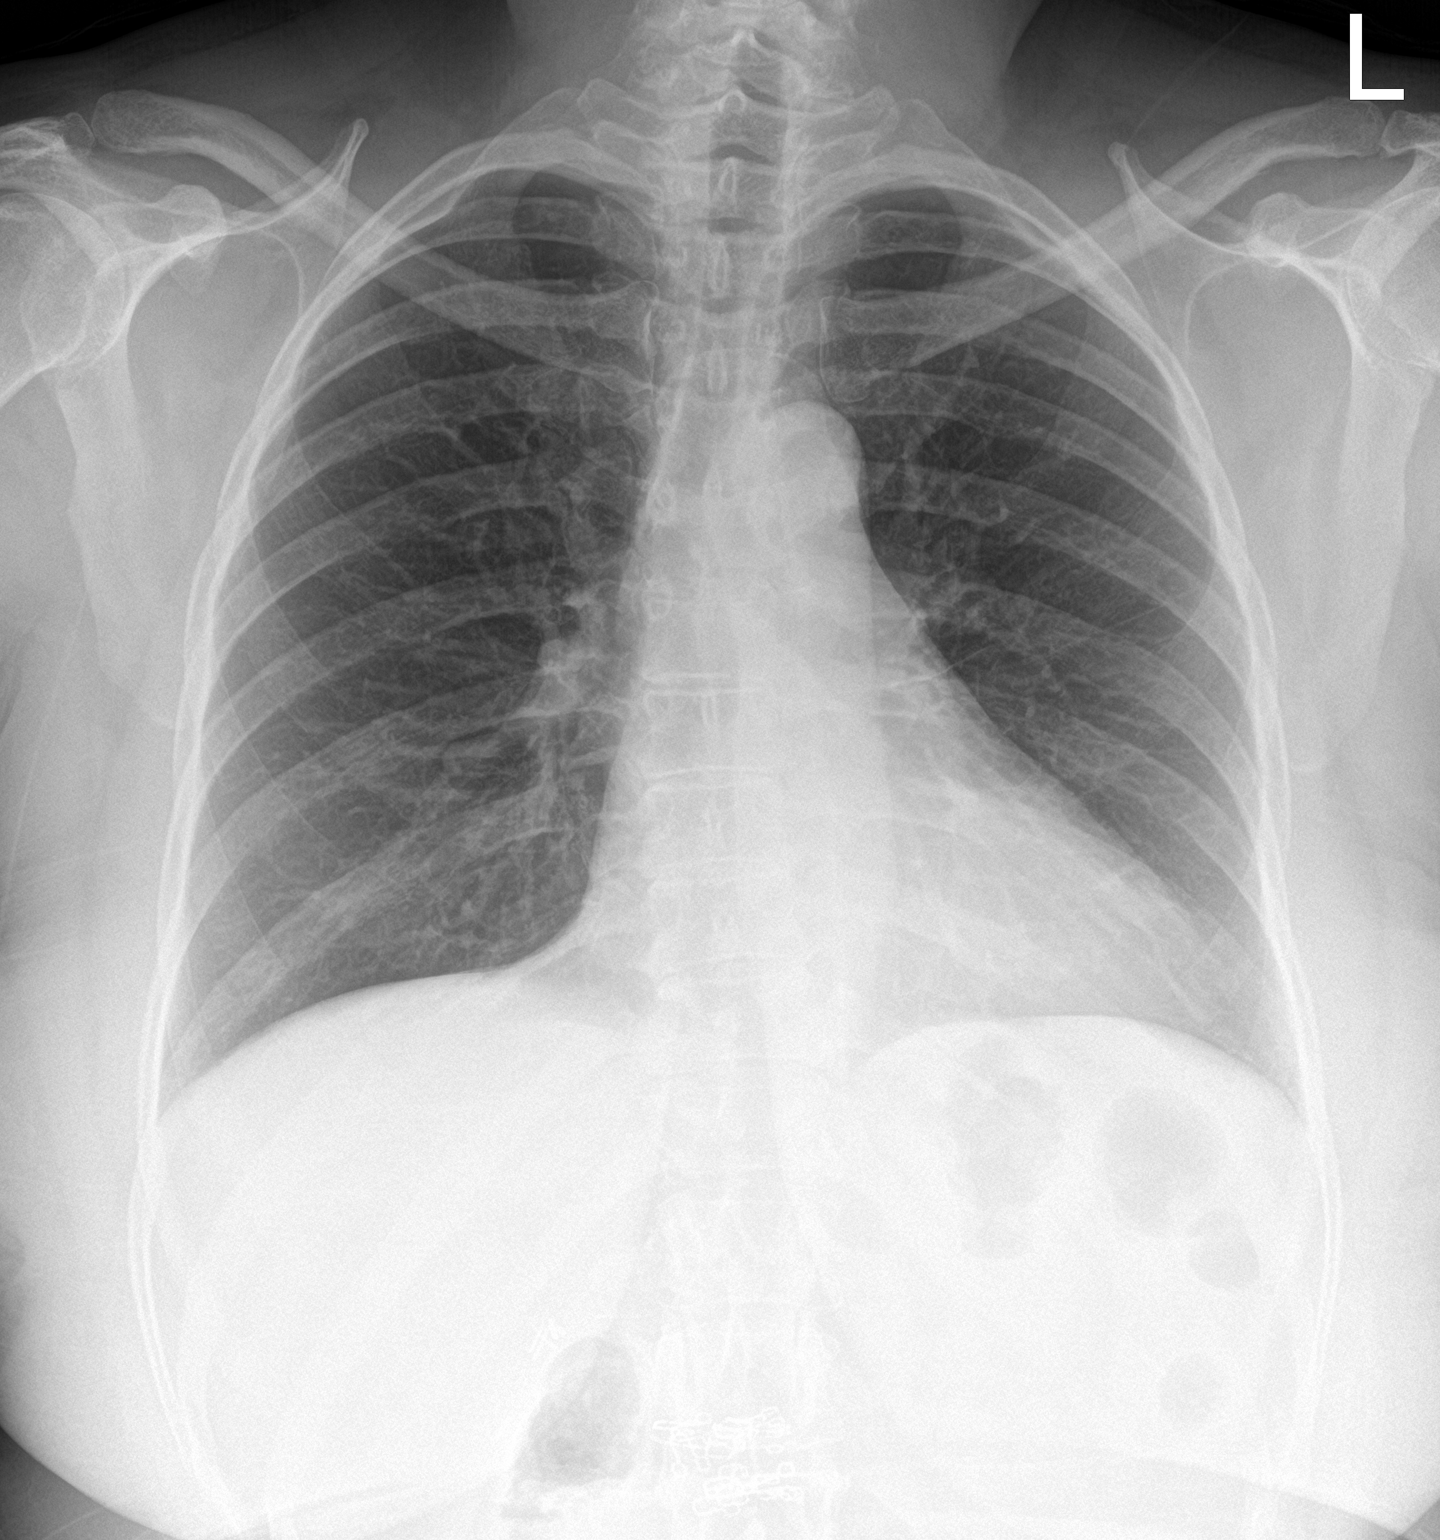

[chest lat]
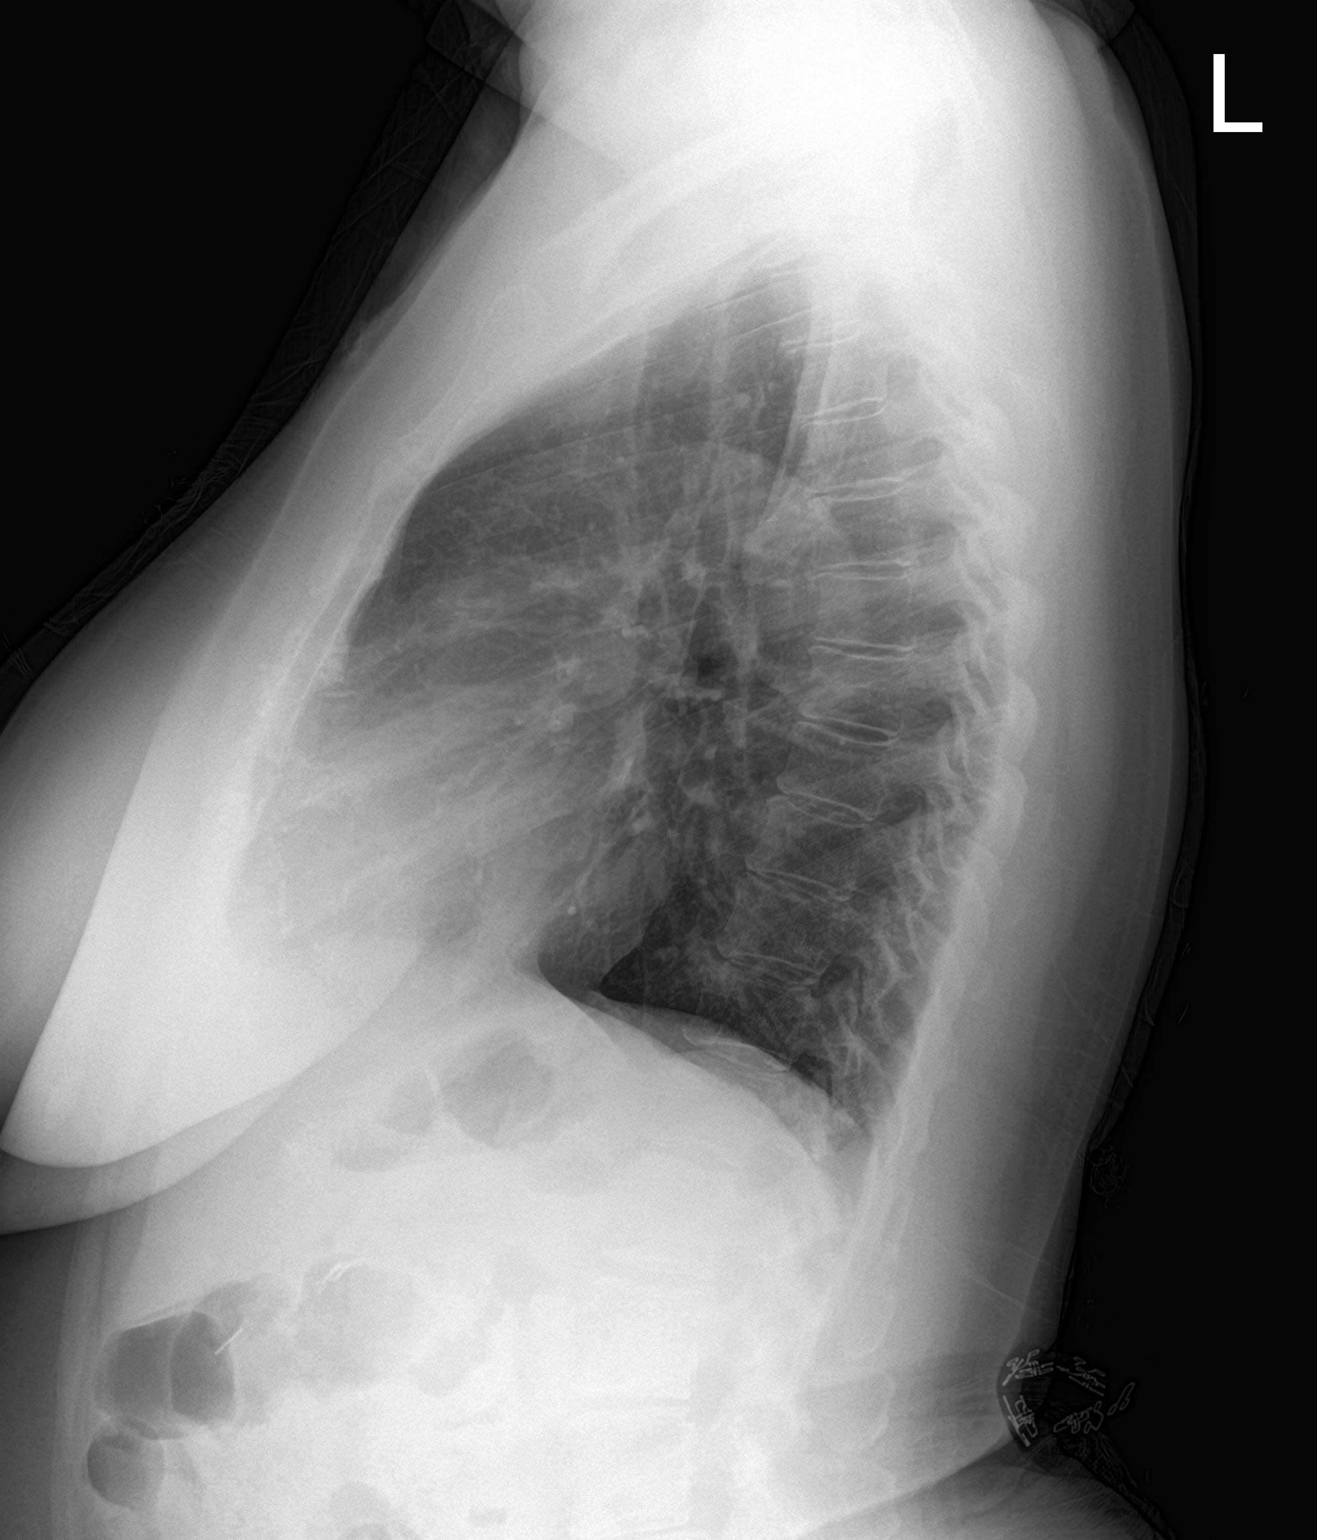

[2 of 2 positions shown; findings below may reference images not displayed]

FINDINGS: Cardiomediastinal silhouette is normal. Mediastinal contours appear
intact.

There is no evidence of focal airspace consolidation, pleural
effusion or pneumothorax.

Osseous structures are without acute abnormality. Soft tissues are
grossly normal.
IMPRESSION: No active cardiopulmonary disease.

## 2021-05-02 MED ORDER — OXYCODONE-ACETAMINOPHEN 5-325 MG PO TABS
1.0000 | ORAL_TABLET | Freq: Four times a day (QID) | ORAL | 0 refills | Status: DC | PRN
Start: 2021-05-02 — End: 2021-07-01

## 2021-05-02 MED ORDER — LOPERAMIDE HCL 2 MG PO CAPS: 2.0000 mg | ORAL_CAPSULE | Freq: Four times a day (QID) | ORAL | 0 refills | Status: DC | PRN

## 2021-05-02 MED ORDER — IOHEXOL 300 MG/ML  SOLN
100.0000 mL | Freq: Once | INTRAMUSCULAR | Status: AC | PRN
  Administered 2021-05-02: 100 mL via INTRAVENOUS

## 2021-05-02 NOTE — ED Provider Notes (Signed)
[facility] EMERGENCY DEPARTMENT Provider Note   CSN: [id number] Arrival date & time: 05/02/21  1636     History  Chief Complaint  Patient presents with   Diarrhea   Abdominal Pain    Kathleen Foley is a 56 y.o. female.  She is here with a complaint of diarrhea for 5 days associated with epigastric and lower abdominal pain.  She rates the pain as 9 out of 10 but was worse earlier.  No chest pain or shortness of breath.  No blood in the stool, no fever.  No recent antibiotics.  She was just here few days ago for left-sided chest pain, she says this is different.  She has nausea but no vomiting.  No vaginal bleeding or discharge no urinary symptoms.  The history is provided by the patient.  Diarrhea Quality:  Watery Severity:  Moderate Onset quality:  Gradual Duration:  5 days Timing:  Intermittent Progression:  Unchanged Relieved by:  Nothing Worsened by:  Nothing Ineffective treatments:  None tried Associated symptoms: abdominal pain   Associated symptoms: no recent cough, no fever, no headaches and no vomiting   Risk factors: no recent antibiotic use, no sick contacts, no suspicious food intake and no travel to endemic areas   Abdominal Pain Pain location:  RLQ, RUQ and epigastric Pain quality: cramping   Pain radiates to:  Back Pain severity:  Severe Onset quality:  Gradual Duration:  5 days Timing:  Intermittent Progression:  Unchanged Chronicity:  New Context: not trauma   Relieved by:  None tried Worsened by:  Nothing Ineffective treatments:  None tried Associated symptoms: diarrhea and nausea   Associated symptoms: no chest pain, no cough, no dysuria, no fever, no hematemesis, no hematochezia, no hematuria, no shortness of breath, no sore throat, no vaginal bleeding, no vaginal discharge and no vomiting       Home Medications Prior to Admission medications   Medication Sig Start Date End Date Taking? Authorizing Provider  albuterol (PROVENTIL) (2.5  MG/3ML) 0.083% nebulizer solution Inhale into the lungs. 05/03/19   [provider]  amLODipine (NORVASC) 2.5 MG tablet TAKE ONE TABLET IN THE MORNING 11/12/20   [provider]  budesonide-formoterol (SYMBICORT) 160-4.5 MCG/ACT inhaler Inhale 2 puffs into the lungs 2 (two) times daily. Patient not taking: Reported on 04/21/2021 08/01/18   [provider]  cetirizine (ZYRTEC) 10 MG tablet TAKE ONE TABLET IN THE MORNING 11/12/20   [provider]  Cholecalciferol 50 MCG (2000 UT) CAPS Take by mouth daily. Patient not taking: Reported on 04/21/2021 02/15/18   [provider]  dexlansoprazole (DEXILANT) 60 MG capsule Take 1 capsule by mouth every morning. 11/12/20   [provider]  escitalopram (LEXAPRO) 20 MG tablet TAKE ONE TABLET IN THE MORNING 09/16/20   [provider]  gabapentin (NEURONTIN) 300 MG capsule SMARTSIG:1 Capsule(s) By Mouth Every Evening 11/12/20   [provider]  GOODSENSE ASPIRIN LOW DOSE 81 MG EC tablet TAKE ONE TABLET IN THE MORNING 10/13/20   [provider]  meloxicam (MOBIC) 15 MG tablet TAKE ONE TABLET IN THE MORNING 11/12/20   [provider]  metoprolol tartrate (LOPRESSOR) 100 MG tablet Take 1 tablet (100 mg total) by mouth once for 1 dose. Take 90-120 minutes prior to scan. 04/21/21 04/21/21  Freada Bergeron, MD  montelukast (SINGULAIR) 10 MG tablet SMARTSIG:1 Tablet(s) By Mouth Every Evening 11/12/20   [provider]  oxybutynin (DITROPAN-XL) 10 MG 24 hr tablet TAKE ONE TABLET  IN THE MORNING 10/14/20   [provider]  pravastatin (PRAVACHOL) 20 MG tablet SMARTSIG:1 Tablet(s) By Mouth Every Evening 11/12/20   [provider]  Prenatal Vit-Fe Fumarate-FA (PRENATAL MULTIVITAMIN) TABS tablet Take 1 tablet by mouth daily at 12 noon.    [provider]  RESTASIS 0.05 % ophthalmic emulsion 1 drop 2 (two) times daily. 11/12/20   [provider]   spironolactone (ALDACTONE) 25 MG tablet Take 1 tablet (25 mg total) by mouth daily. 04/21/21   Freada Bergeron, MD      Allergies    Hydrocodone and Latex    Review of Systems   Review of Systems  Constitutional:  Negative for fever.  HENT:  Negative for sore throat.   Eyes:  Negative for visual disturbance.  Respiratory:  Negative for cough and shortness of breath.   Cardiovascular:  Negative for chest pain.  Gastrointestinal:  Positive for abdominal pain, diarrhea and nausea. Negative for hematemesis, hematochezia and vomiting.  Genitourinary:  Negative for dysuria, hematuria, vaginal bleeding and vaginal discharge.  Musculoskeletal:  Positive for back pain.  Skin:  Negative for rash.  Neurological:  Negative for headaches.   Physical Exam Updated Vital Signs BP 128/87    Pulse 96    Temp 98.5 F (36.9 C) (Oral)    Resp 18    Ht 5\' 3"  (1.6 m)    Wt 77.1 kg    SpO2 99%    BMI 30.11 kg/m  Physical Exam Vitals and nursing note reviewed.  Constitutional:      General: She is not in acute distress.    Appearance: Normal appearance. She is well-developed.  HENT:     Head: Normocephalic and atraumatic.  Eyes:     Conjunctiva/sclera: Conjunctivae normal.  Cardiovascular:     Rate and Rhythm: Normal rate and regular rhythm.     Heart sounds: No murmur heard. Pulmonary:     Effort: Pulmonary effort is normal. No respiratory distress.     Breath sounds: Normal breath sounds.  Abdominal:     Palpations: Abdomen is soft.     Tenderness: There is no abdominal tenderness. There is no guarding or rebound.  Musculoskeletal:        General: No swelling.     Cervical back: Neck supple.  Skin:    General: Skin is warm and dry.     Capillary Refill: Capillary refill takes less than 2 seconds.  Neurological:     General: No focal deficit present.     Mental Status: She is alert.     Gait: Gait normal.  Psychiatric:        Mood and Affect: Mood normal.    ED Results /  Procedures / Treatments   Labs (all labs ordered are listed, but only abnormal results are displayed) Labs Reviewed  BASIC METABOLIC PANEL - Abnormal; Notable for the following components:      Result Value   Creatinine, Ser 1.07 (*)    All other components within normal limits  CBC - Abnormal; Notable for the following components:   RBC 3.61 (*)    Hemoglobin 11.1 (*)    HCT 34.1 (*)    All other components within normal limits  LIPASE, BLOOD  HEPATIC FUNCTION PANEL  TROPONIN I (HIGH SENSITIVITY)  TROPONIN I (HIGH SENSITIVITY)    EKG EKG Interpretation  Date/Time:  Saturday May 02 2021 17:15:42 EST Ventricular Rate:  85 PR Interval:  148 QRS Duration: 72 QT Interval:  330  QTC Calculation: 392 R Axis:   78 Text Interpretation: Normal sinus rhythm Possible Left atrial enlargement Nonspecific T wave abnormality Abnormal ECG When compared with ECG of 29-Apr-2021 07:52, No significant change since last tracing Confirmed by Aletta Edouard [phone]) on 05/02/2021 5:18:04 PM  Radiology DG Chest 2 View  Result Date: 05/02/2021 CLINICAL DATA:  Epigastric pain. EXAM: CHEST - 2 VIEW COMPARISON:  April 29, 2021 FINDINGS: Cardiomediastinal silhouette is normal. Mediastinal contours appear intact. There is no evidence of focal airspace consolidation, pleural effusion or pneumothorax. Osseous structures are without acute abnormality. Soft tissues are grossly normal. IMPRESSION: No active cardiopulmonary disease. Electronically Signed   By: Fidela Salisbury M.D.   On: 05/02/2021 17:57   CT Abdomen Pelvis W Contrast  Result Date: 05/02/2021 CLINICAL DATA:  Epigastric pain and diarrhea for 5 days EXAM: CT ABDOMEN AND PELVIS WITH CONTRAST TECHNIQUE: Multidetector CT imaging of the abdomen and pelvis was performed using the standard protocol following bolus administration of intravenous contrast. RADIATION DOSE REDUCTION: This exam was performed according to the departmental  dose-optimization program which includes automated exposure control, adjustment of the mA and/or kV according to patient size and/or use of iterative reconstruction technique. CONTRAST:  128mL OMNIPAQUE IOHEXOL 300 MG/ML  SOLN COMPARISON:  CT 04/12/2010 FINDINGS: Lower chest: No acute abnormality. Hepatobiliary: Subcentimeter low-density lesion at the left hepatic dome, too small to definitively characterize. Liver is otherwise unremarkable in appearance. Status post cholecystectomy. No biliary dilatation. Pancreas: Unremarkable. No pancreatic ductal dilatation or surrounding inflammatory changes. Spleen: Normal in size without focal abnormality. Adrenals/Urinary Tract: Unremarkable adrenal glands. Kidneys enhance symmetrically without focal lesion, stone, or hydronephrosis. Ureters are nondilated. Urinary bladder appears unremarkable. Stomach/Bowel: Stomach is within normal limits. Appendix appears normal. No evidence of bowel wall thickening, distention, or inflammatory changes. Vascular/Lymphatic: No significant vascular findings are present. Circumaortic left renal vein, an anatomic variant. No enlarged abdominal or pelvic lymph nodes. Reproductive: Status post hysterectomy. No adnexal masses. Other: No free fluid. No abdominopelvic fluid collection. No pneumoperitoneum. Rectus diastasis. Musculoskeletal: No acute or significant osseous findings. IMPRESSION: No acute abdominopelvic findings. Electronically Signed   By: Davina Poke D.O.   On: 05/02/2021 20:36    Procedures Procedures    Medications Ordered in ED Medications - No data to display  ED Course/ Medical Decision Making/ A&P                           Medical Decision Making This patient complains of diarrhea and lower abdominal pain; this involves an extensive number of treatment Options and is a complaint that carries with it a high risk of complications and Morbidity. The differential includes infectious diarrhea, colitis,  diverticulitis, ACS, abscess, perforation  I ordered, reviewed and interpreted labs, which included CBC with normal white count, hemoglobin low stable from priors, chemistries unremarkable, LFTs normal, troponin unremarkable I ordered imaging studies which included chest x-ray and CT abdomen and pelvis and I independently    visualized and interpreted imaging which showed no acute findings  Previous records obtained and reviewed in epic including prior ED visit for chest pain  After the interventions stated above, I reevaluated the patient and found patient to be comfortable appearing with stable vitals.  Reviewed results of work-up with her.  She is comfortable plan for discharge with antidiarrheal medication and pain medication.  Recommended close follow-up with PCP.  Return instructions discussed          Final Clinical Impression(s) /  ED Diagnoses Final diagnoses:  Lower abdominal pain  Diarrhea, unspecified type    Rx / DC Orders ED Discharge Orders          Ordered    oxyCODONE-acetaminophen (PERCOCET/ROXICET) 5-325 MG tablet  Every 6 hours PRN        05/02/21 2121    loperamide (IMODIUM) 2 MG capsule  4 times daily PRN        05/02/21 2121              Hayden Rasmussen, MD 05/03/21 [phone]

## 2021-05-02 NOTE — ED Triage Notes (Signed)
Pt arrives pov with c/o diarrhea and epigastric pain x 5 days. Also reports back pain with nausea, decreased appetite

## 2021-05-02 NOTE — Discharge Instructions (Addendum)
You are seen in the emergency department for lower abdominal pain and diarrhea.  You had lab work, chest x-ray EKG and a CAT scan of your abdomen and pelvis that did not show an obvious explanation for your symptoms.  We are prescribing you some diarrhea medication and some pain medication.  Please follow-up with your primary care doctor.  Return to the emergency department if any worsening or concerning symptoms

## 2021-05-18 ENCOUNTER — Telehealth (HOSPITAL_COMMUNITY): Payer: Self-pay | Admitting: *Deleted

## 2021-05-18 NOTE — Telephone Encounter (Signed)
Reaching out to patient to offer assistance regarding upcoming cardiac imaging study; pt verbalizes understanding of appt date/time, parking situation and where to check in, pre-test NPO status and medications ordered, and verified current allergies; name and call back number provided for further questions should they arise  Taydon Nasworthy RN Navigator Cardiac Imaging [facility] [phone] office [phone] cell  Patient to take 100mg metoprolol tartrate two hours prior to cardiac CT scan.  She is aware to arrive at 9:30am for her 10am scan. 

## 2021-05-19 ENCOUNTER — Ambulatory Visit (HOSPITAL_COMMUNITY)
Admission: RE | Admit: 2021-05-19 | Discharge: 2021-05-19 | Disposition: A | Discharge status: Home / Self Care | Payer: Medicaid Other | Source: Ambulatory Visit | Attending: Cardiology | Admitting: Cardiology

## 2021-05-19 ENCOUNTER — Other Ambulatory Visit: Payer: Self-pay

## 2021-05-19 DIAGNOSIS — R072 Precordial pain: Secondary | ICD-10-CM | POA: Insufficient documentation

## 2021-05-19 IMAGING — CT CT HEART MORP W/ CTA COR W/ SCORE W/ CA W/CM &/OR W/O CM
4 of 7 series · 8 of 20 positions shown, 9 images · IV contrast (APPLIED)
Comparison: None.
COMPARISON: None.

Addendum:
EXAM:
OVER-READ INTERPRETATION  CT CHEST

The following report is an over-read performed by radiologist Dr.
over-read does not include interpretation of cardiac or coronary
anatomy or pathology. The coronary CTA interpretation by the
cardiologist is attached.
CLINICAL DATA: This is a 56 year old female with chest pain.
Cardiac/Coronary  CTA
TECHNIQUE: The patient was scanned on a [employer] Force scanner.

[Series 6: best diast 77 % · axial · 0.42mm/px · z∈[-210,-167]mm · 2 of 323 slices shown, 3 images]
[im 108/323  vessel]
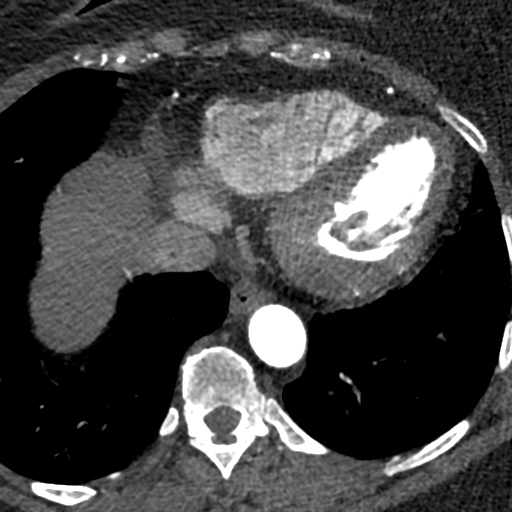
[im 108/323  lung]
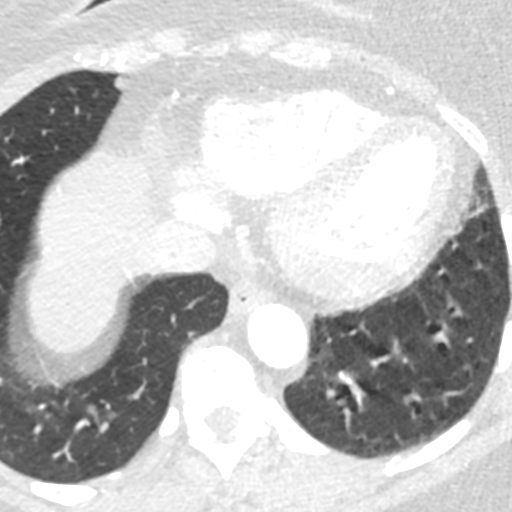
[im 215/323  vessel]
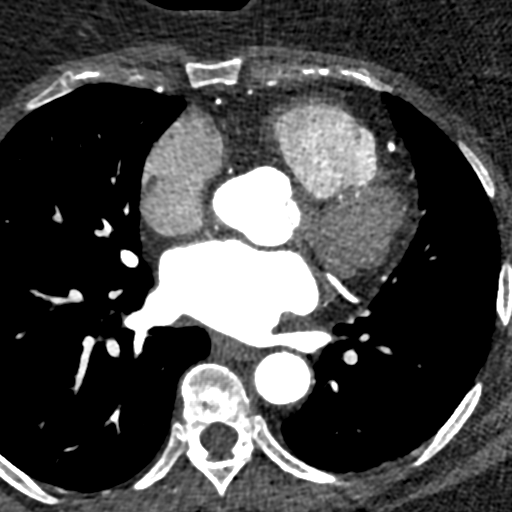

[Series 7: best syst 34 % · axial · 0.42mm/px · z∈[-210,-167]mm · 2 of 323 slices shown]
[im 108/323  vessel]
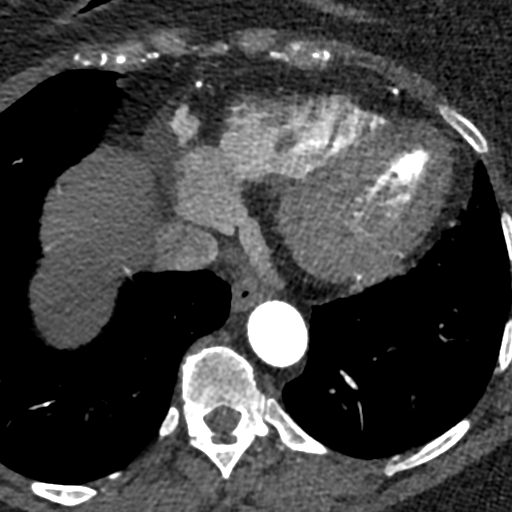
[im 215/323  vessel]
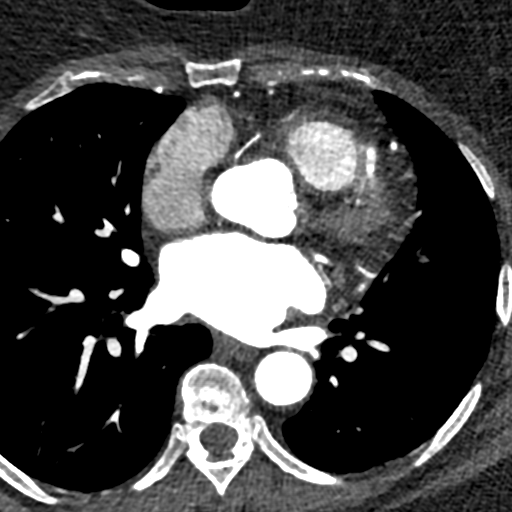

[Series 8: ts diast sharp 77 % · axial · 0.42mm/px · z∈[-210,-167]mm · 2 of 323 slices shown]
[im 108/323  lung]
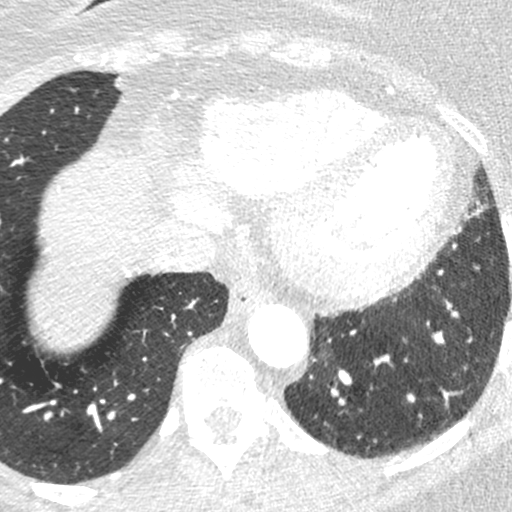
[im 215/323  lung]
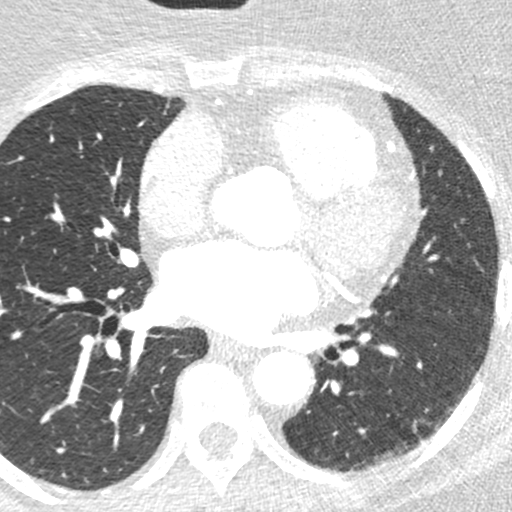

[Series 9: ts syst sharp 34 % · axial · 0.42mm/px · z∈[-210,-167]mm · 2 of 323 slices shown]
[im 108/323  lung]
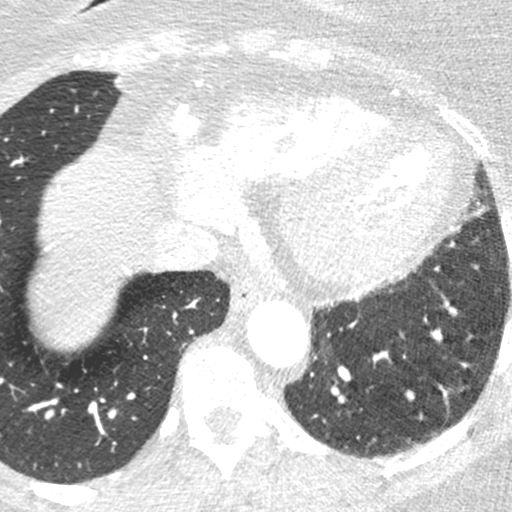
[im 215/323  lung]
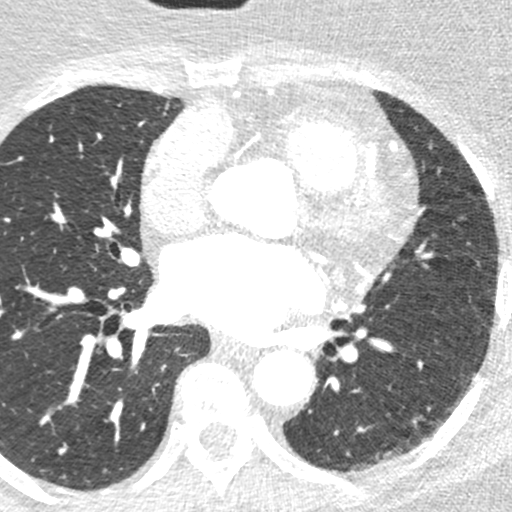

[8 of 20 positions shown; findings below may reference images not displayed]

FINDINGS: Vascular: No significant vascular findings. Normal heart size. No
pericardial effusion.

Mediastinum/Nodes: No enlarged mediastinal or hilar lymph nodes.

Lungs/Pleura: Visualized portions of the lung parenchyma are
unremarkable.

Upper Abdomen: No acute abnormality.

Musculoskeletal: No worrisome lytic or sclerotic osseous
abnormality.
IMPRESSION: No acute or clinically significant extracardiac findings.
FINDINGS: A 100 kV prospective scan was triggered in the descending thoracic
aorta at 111 HU's. Axial non-contrast 3 mm slices were carried out
through the heart. The data set was analyzed on a dedicated work
station and scored using the Agatson method. Gantry rotation speed
was 250 msecs and collimation was .6 mm. No beta blockade and 0.8 mg
of sl NTG was given. The 3D data set was reconstructed in 5%
intervals of the 67-82 % of the R-R cycle. Diastolic phases were
analyzed on a dedicated work station using MPR, MIP and VRT modes.
The patient received 80 cc of contrast.

Aorta: Normal size.  No calcifications.  No dissection.

Aortic Valve:  Trileaflet.  No calcifications.

Coronary Arteries:  Normal coronary origin.  Right dominance.

RCA is a large dominant artery that gives rise to PDA and PLA. There
is no plaque.

Left main is a large artery that gives rise to LAD and LCX arteries.

LAD is a large vessel that has no plaque.

LCX is a non-dominant artery that gives rise to one large OM1
branch. There is no plaque.

Coronary Calcium Score:

Left main: 0

Left anterior descending artery: 0

Left circumflex artery: 0

Right coronary artery: 0

Total: 0

Percentile: 0

Other findings:

Normal pulmonary vein drainage into the left atrium.

Normal left atrial appendage without a thrombus.

Normal size of the pulmonary artery.
IMPRESSION: 1. Coronary calcium score of 0. This was 0 percentile for age and
sex matched control.

2. Normal coronary origin with right dominance.

3. No evidence of CAD. CAD-RADS 0. No evidence of CAD (0%). Consider
non-atherosclerotic causes of chest pain.

*** End of Addendum ***
EXAM:
OVER-READ INTERPRETATION  CT CHEST

The following report is an over-read performed by radiologist Dr.
over-read does not include interpretation of cardiac or coronary
anatomy or pathology. The coronary CTA interpretation by the
cardiologist is attached.
FINDINGS: Vascular: No significant vascular findings. Normal heart size. No
pericardial effusion.

Mediastinum/Nodes: No enlarged mediastinal or hilar lymph nodes.

Lungs/Pleura: Visualized portions of the lung parenchyma are
unremarkable.

Upper Abdomen: No acute abnormality.

Musculoskeletal: No worrisome lytic or sclerotic osseous
abnormality.
IMPRESSION: No acute or clinically significant extracardiac findings.

## 2021-05-19 MED ORDER — NITROGLYCERIN 0.4 MG SL SUBL
0.8000 mg | SUBLINGUAL_TABLET | Freq: Once | SUBLINGUAL | Status: AC
  Administered 2021-05-19: 0.8 mg via SUBLINGUAL

## 2021-05-19 MED ORDER — IOHEXOL 350 MG/ML SOLN
100.0000 mL | Freq: Once | INTRAVENOUS | Status: AC | PRN
  Administered 2021-05-19: 95 mL via INTRAVENOUS

## 2021-05-19 MED ORDER — NITROGLYCERIN 0.4 MG SL SUBL
SUBLINGUAL_TABLET | SUBLINGUAL | Status: AC
  Filled 2021-05-19: qty 2

## 2021-06-17 ENCOUNTER — Emergency Department (HOSPITAL_COMMUNITY): Payer: Medicaid Other

## 2021-06-17 ENCOUNTER — Emergency Department (HOSPITAL_BASED_OUTPATIENT_CLINIC_OR_DEPARTMENT_OTHER): Payer: Medicaid Other

## 2021-06-17 ENCOUNTER — Emergency Department (HOSPITAL_BASED_OUTPATIENT_CLINIC_OR_DEPARTMENT_OTHER)
Admission: EM | Admit: 2021-06-17 | Discharge: 2021-06-17 | Disposition: A | Discharge status: Home / Self Care | Payer: Medicaid Other | Attending: Emergency Medicine | Admitting: Emergency Medicine

## 2021-06-17 ENCOUNTER — Other Ambulatory Visit: Payer: Self-pay

## 2021-06-17 ENCOUNTER — Encounter (HOSPITAL_BASED_OUTPATIENT_CLINIC_OR_DEPARTMENT_OTHER): Payer: Self-pay | Admitting: Emergency Medicine

## 2021-06-17 DIAGNOSIS — R42 Dizziness and giddiness: Secondary | ICD-10-CM | POA: Insufficient documentation

## 2021-06-17 DIAGNOSIS — E236 Other disorders of pituitary gland: Secondary | ICD-10-CM

## 2021-06-17 DIAGNOSIS — R200 Anesthesia of skin: Secondary | ICD-10-CM | POA: Diagnosis present

## 2021-06-17 DIAGNOSIS — Z20822 Contact with and (suspected) exposure to covid-19: Secondary | ICD-10-CM | POA: Diagnosis not present

## 2021-06-17 DIAGNOSIS — R202 Paresthesia of skin: Secondary | ICD-10-CM | POA: Insufficient documentation

## 2021-06-17 DIAGNOSIS — M546 Pain in thoracic spine: Secondary | ICD-10-CM | POA: Insufficient documentation

## 2021-06-17 DIAGNOSIS — Z91040 Latex allergy status: Secondary | ICD-10-CM | POA: Insufficient documentation

## 2021-06-17 DIAGNOSIS — Z79899 Other long term (current) drug therapy: Secondary | ICD-10-CM | POA: Diagnosis not present

## 2021-06-17 LAB — COMPREHENSIVE METABOLIC PANEL
ALT: 18 U/L (ref 0–44)
AST: 22 U/L (ref 15–41)
Albumin: 3.7 g/dL (ref 3.5–5.0)
Alkaline Phosphatase: 68 U/L (ref 38–126)
Anion gap: 9 (ref 5–15)
BUN: 12 mg/dL (ref 6–20)
CO2: 26 mmol/L (ref 22–32)
Calcium: 8.7 mg/dL — ABNORMAL LOW (ref 8.9–10.3)
Chloride: 106 mmol/L (ref 98–111)
Creatinine, Ser: 1.2 mg/dL — ABNORMAL HIGH (ref 0.44–1.00)
GFR, Estimated: 52 mL/min — ABNORMAL LOW (ref >60)
Glucose, Bld: 125 mg/dL — ABNORMAL HIGH (ref 70–99)
Potassium: 3.9 mmol/L (ref 3.5–5.1)
Sodium: 141 mmol/L (ref 135–145)
Total Bilirubin: 0.7 mg/dL (ref 0.3–1.2)
Total Protein: 7.3 g/dL (ref 6.5–8.1)

## 2021-06-17 LAB — RESP PANEL BY RT-PCR (FLU A&B, COVID) ARPGX2
Influenza A by PCR: NEGATIVE
Influenza B by PCR: NEGATIVE
SARS Coronavirus 2 by RT PCR: NEGATIVE

## 2021-06-17 LAB — URINALYSIS, ROUTINE W REFLEX MICROSCOPIC
Bilirubin Urine: NEGATIVE
Glucose, UA: NEGATIVE mg/dL
Hgb urine dipstick: NEGATIVE
Ketones, ur: NEGATIVE mg/dL
Nitrite: NEGATIVE
Protein, ur: NEGATIVE mg/dL
Specific Gravity, Urine: >1.03 — ABNORMAL HIGH (ref 1.005–1.030)
pH: 7 (ref 5.0–8.0)

## 2021-06-17 LAB — DIFFERENTIAL
Abs Immature Granulocytes: 0.02 10*3/uL (ref 0.00–0.07)
Basophils Absolute: 0.1 10*3/uL (ref 0.0–0.1)
Basophils Relative: 1 %
Eosinophils Absolute: 0.2 10*3/uL (ref 0.0–0.5)
Eosinophils Relative: 3 %
Immature Granulocytes: 0 %
Lymphocytes Relative: 30 %
Lymphs Abs: 1.8 10*3/uL (ref 0.7–4.0)
Monocytes Absolute: 0.6 10*3/uL (ref 0.1–1.0)
Monocytes Relative: 9 %
Neutro Abs: 3.5 10*3/uL (ref 1.7–7.7)
Neutrophils Relative %: 57 %

## 2021-06-17 LAB — RAPID URINE DRUG SCREEN, HOSP PERFORMED
Amphetamines: NOT DETECTED
Barbiturates: NOT DETECTED
Benzodiazepines: NOT DETECTED
Cocaine: NOT DETECTED
Opiates: NOT DETECTED
Tetrahydrocannabinol: NOT DETECTED

## 2021-06-17 LAB — URINALYSIS, MICROSCOPIC (REFLEX)

## 2021-06-17 LAB — APTT: aPTT: 28 seconds (ref 24–36)

## 2021-06-17 LAB — CBC
HCT: 34.2 % — ABNORMAL LOW (ref 36.0–46.0)
Hemoglobin: 11.5 g/dL — ABNORMAL LOW (ref 12.0–15.0)
MCH: 32.3 pg (ref 26.0–34.0)
MCHC: 33.6 g/dL (ref 30.0–36.0)
MCV: 96.1 fL (ref 80.0–100.0)
Platelets: 381 10*3/uL (ref 150–400)
RBC: 3.56 MIL/uL — ABNORMAL LOW (ref 3.87–5.11)
RDW: 13.8 % (ref 11.5–15.5)
WBC: 6.2 10*3/uL (ref 4.0–10.5)
nRBC: 0 % (ref 0.0–0.2)

## 2021-06-17 LAB — PROTIME-INR
INR: 1 (ref 0.8–1.2)
Prothrombin Time: 13.7 seconds (ref 11.4–15.2)

## 2021-06-17 IMAGING — MR MR CERVICAL SPINE WO/W CM
4 of 8 series · 18 of 48 positions shown · IV contrast (gadavist)
Comparison: None.

EXAM:
MRI HEAD WITHOUT AND WITH CONTRAST

MRI CERVICAL SPINE WITHOUT AND WITH CONTRAST
MRI CERVICAL THORACIC WITHOUT AND CONTRAST
CONTRAST:  8mL GADAVIST GADOBUTROL 1 MMOL/ML IV SOLN
TECHNIQUE: Multiplanar, multiecho pulse sequences of the brain and surrounding
structures, and cervical and thoracic spine were obtained without
and with intravenous contrast.

[Series 11: T2 · sagittal · 3.0mm · 0.43mm/px · 4 of 16 slices shown (1 of 2)]
[im 1/16]
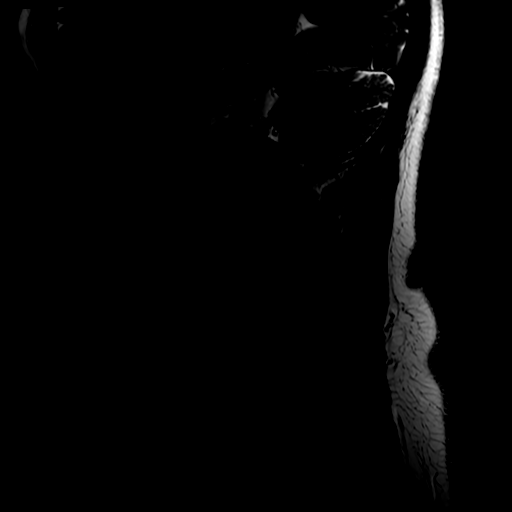
[im 6/16]
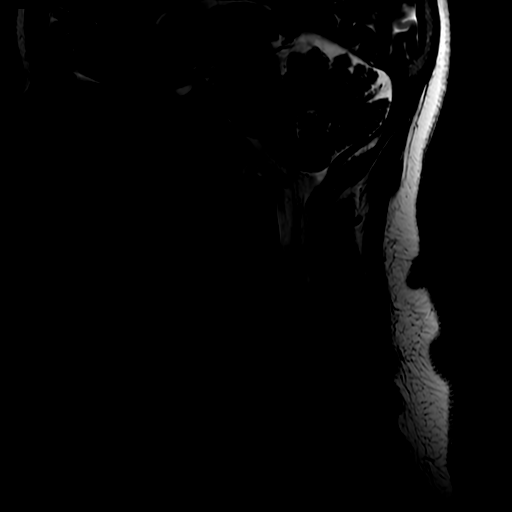
[im 11/16]
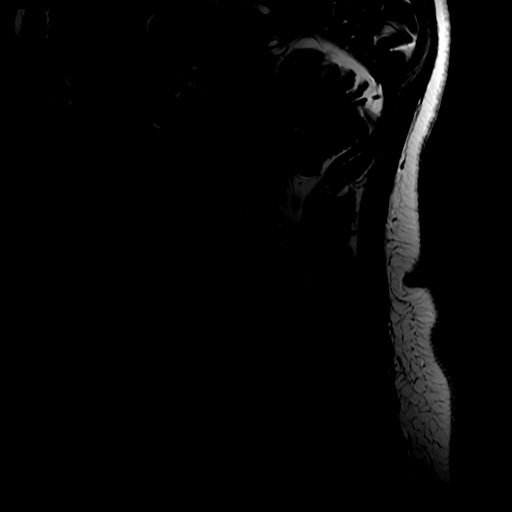
[im 16/16]
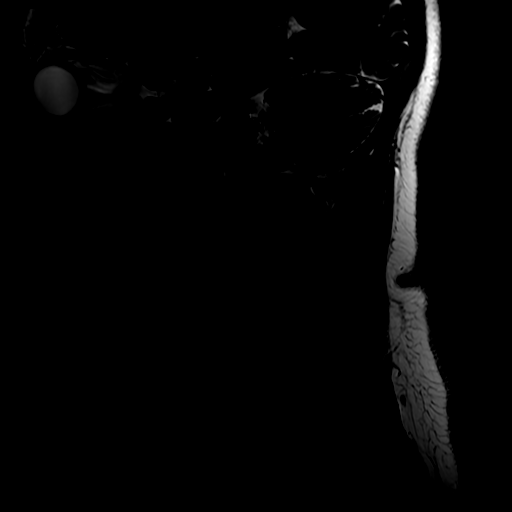

[Series 15: T2 · axial · 3.0mm · 0.35mm/px · z∈[-232,-140]mm · 6 of 30 slices shown (2 of 2)]
[im 1/30]
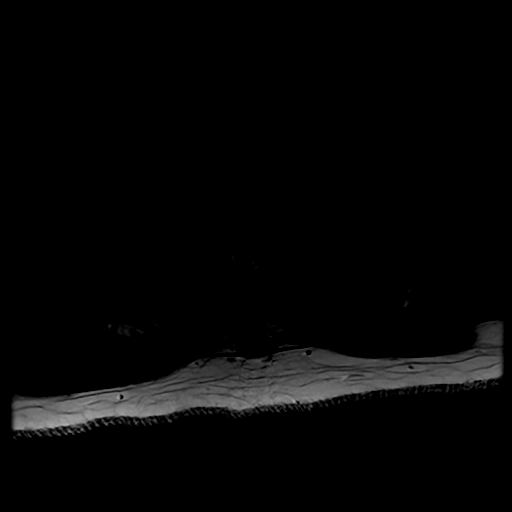
[im 6/30]
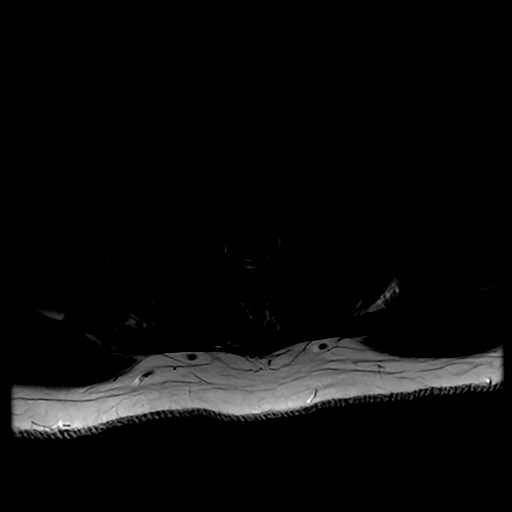
[im 12/30]
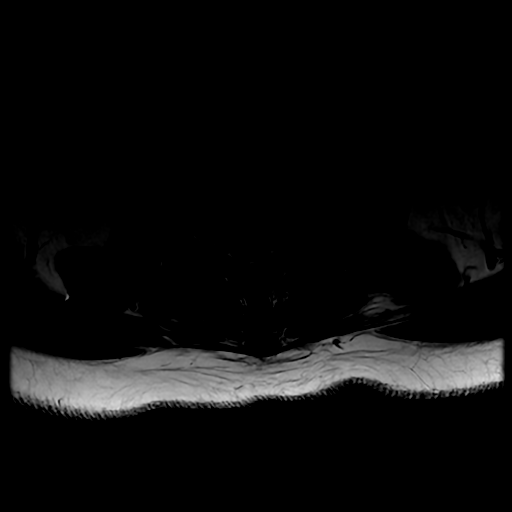
[im 18/30]
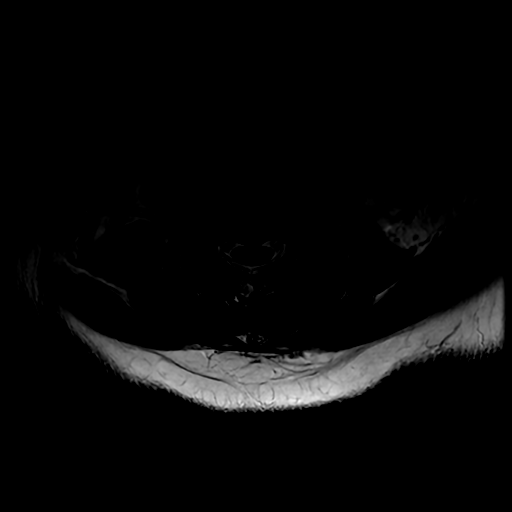
[im 24/30]
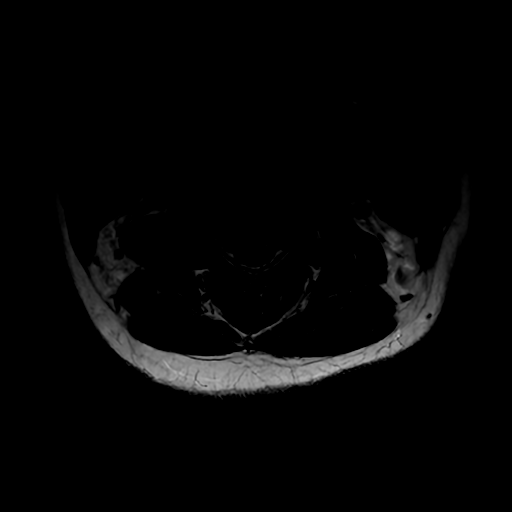
[im 30/30]
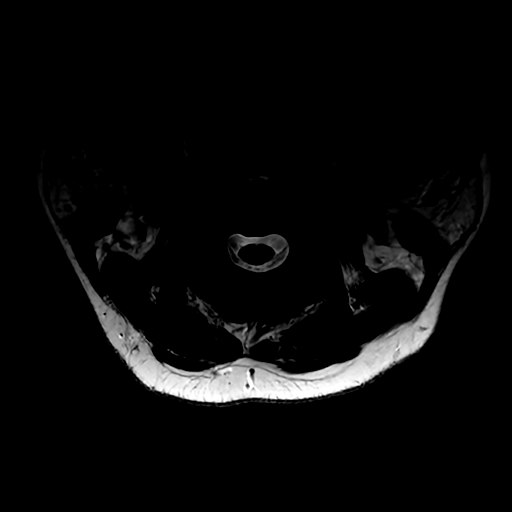

[Series 16: T1 · axial · non-contrast · 3.0mm · 0.35mm/px · z∈[-232,-140]mm · 5 of 30 slices shown]
[im 1/30]
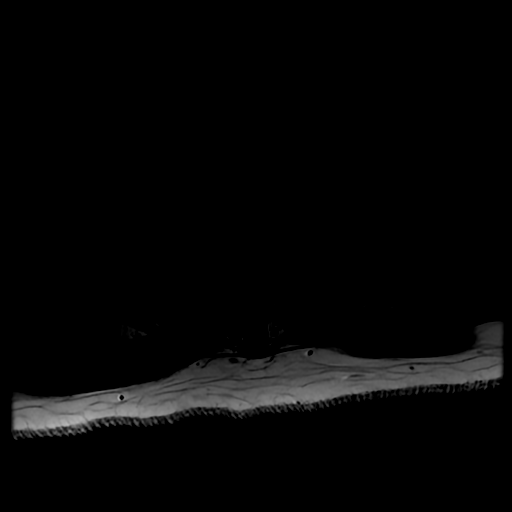
[im 6/30]
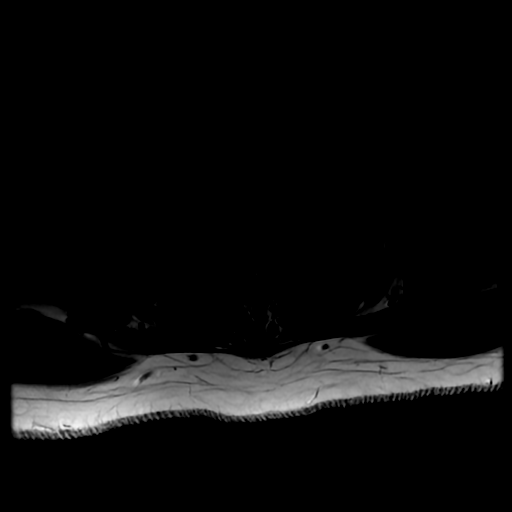
[im 12/30]
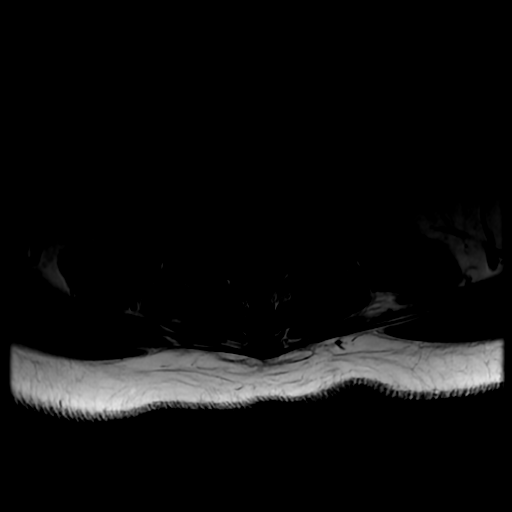
[im 18/30]
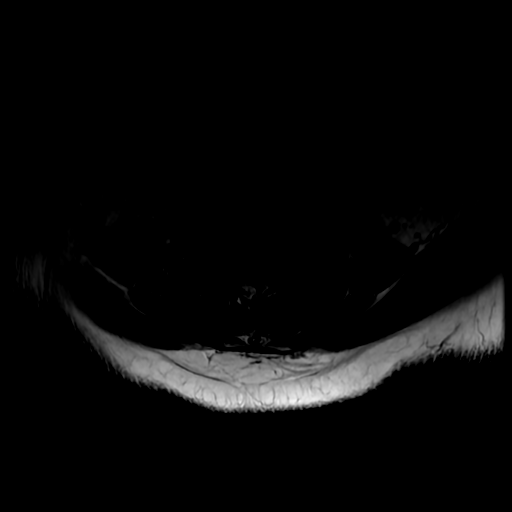
[im 30/30]
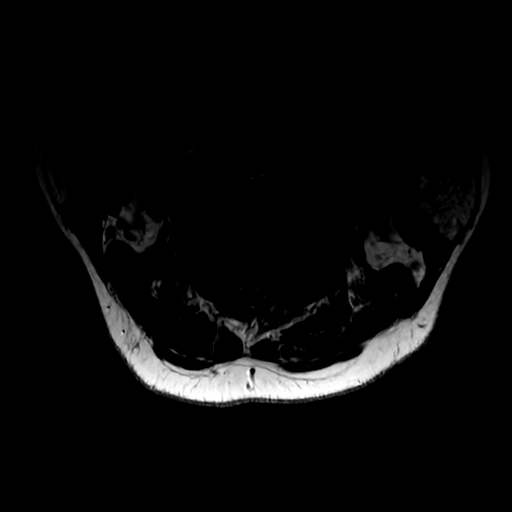

[Series 28: T1 fat-sat post-contrast · sagittal · 3.0mm · 0.43mm/px · 3 of 16 slices shown]
[im 1/16]
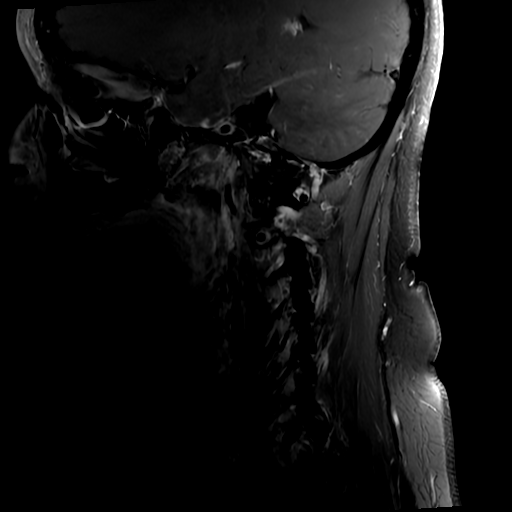
[im 8/16]
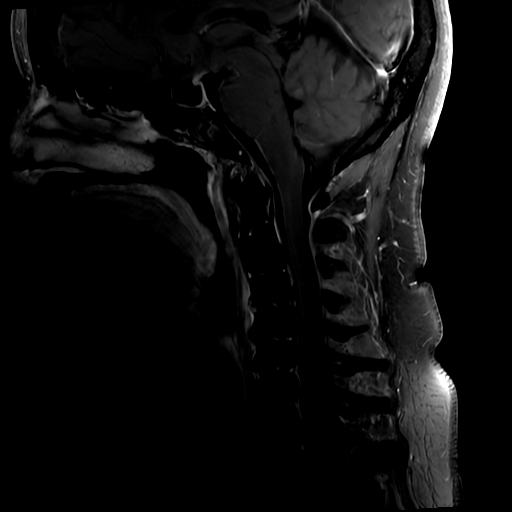
[im 16/16]
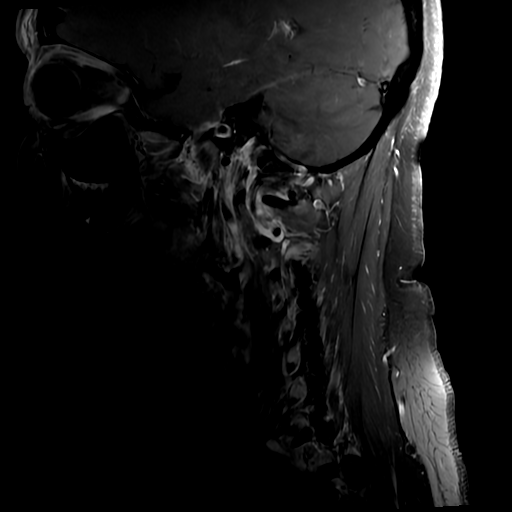

[18 of 48 positions shown; findings below may reference images not displayed]

FINDINGS: MRI HEAD FINDINGS

Brain: No acute infarct, mass effect or extra-axial collection. No
acute or chronic hemorrhage. Single focus of hyperintense
T2-weighted signal in the left subcortical white matter. Otherwise
normal parenchymal signal. A partially empty sella is incidentally
noted. There is no abnormal contrast enhancement.

Vascular: Major flow voids are preserved.

Skull and upper cervical spine: Normal calvarium and skull base.
Visualized upper cervical spine and soft tissues are normal.

Sinuses/Orbits:No paranasal sinus fluid levels or advanced mucosal
thickening. No mastoid or middle ear effusion. Normal orbits.

MRI CERVICAL SPINE FINDINGS

Alignment: Physiologic.

Vertebrae: No fracture, evidence of discitis, or bone lesion.

Cord: Normal signal and morphology.

Posterior Fossa, vertebral arteries, paraspinal tissues: Negative.

Disc levels:

C1-2: Unremarkable.

C2-3: Normal disc space and facet joints. There is no spinal canal
stenosis. No neural foraminal stenosis.

C3-4: Small central disc protrusion. Mild spinal canal stenosis. No
neural foraminal stenosis.

C4-5: Small central disc protrusion. Mild spinal canal stenosis. No
neural foraminal stenosis.

C5-6: Small disc bulge with left-greater-than-right uncovertebral
hypertrophy. Mild spinal canal stenosis. Mild left neural foraminal
stenosis.

C6-7: Left-greater-than-right uncovertebral hypertrophy. There is no
spinal canal stenosis. Mild left neural foraminal stenosis.

C7-T1: Normal disc space and facet joints. There is no spinal canal
stenosis. No neural foraminal stenosis.

MRI THORACIC SPINE FINDINGS

Alignment:  Physiologic.

Vertebrae: No fracture, evidence of discitis, or bone lesion.

Cord:  Normal signal and morphology.

Paraspinal and other soft tissues: Normal

Disc levels:

T5-6: Small central disc protrusion without stenosis.

T11-12: Minimal disc bulge without stenosis.

The other thoracic levels are unremarkable.
IMPRESSION: 1. Normal MRI of the brain for age.
2. Mild cervical degenerative disc disease with mild spinal canal
stenosis at the C3-4 through C5-6 levels.
3. Mild thoracic degenerative disc disease without spinal canal or
neural foraminal stenosis.

## 2021-06-17 IMAGING — MR MR HEAD WO/W CM
2 series · 48 of 48 positions shown · IV contrast (gadavist)
Comparison: None.

EXAM:
MRI HEAD WITHOUT AND WITH CONTRAST

MRI CERVICAL SPINE WITHOUT AND WITH CONTRAST
MRI CERVICAL THORACIC WITHOUT AND CONTRAST
CONTRAST:  8mL GADAVIST GADOBUTROL 1 MMOL/ML IV SOLN
TECHNIQUE: Multiplanar, multiecho pulse sequences of the brain and surrounding
structures, and cervical and thoracic spine were obtained without
and with intravenous contrast.

[Series 250: ADC · axial · 3.0mm · 0.94mm/px · z∈[-139,+0]mm · 28 of 50 slices shown (1 of 2)]
[im 1/50]
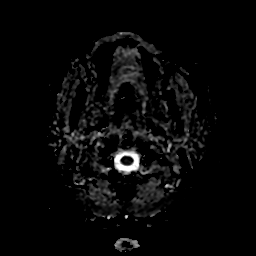
[im 2/50]
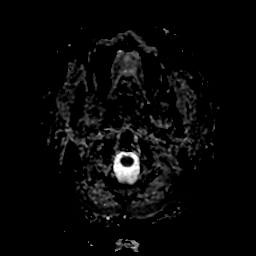
[im 4/50]
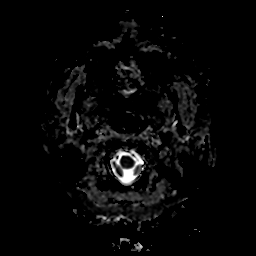
[im 6/50]
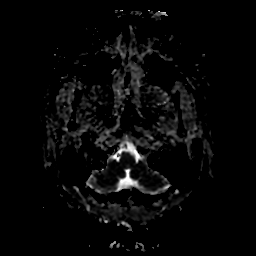
[im 8/50]
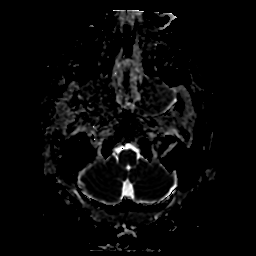
[im 10/50]
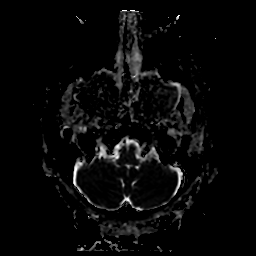
[im 11/50]
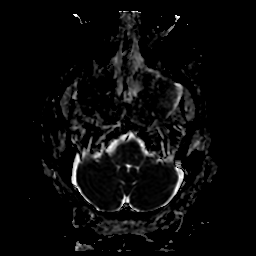
[im 13/50]
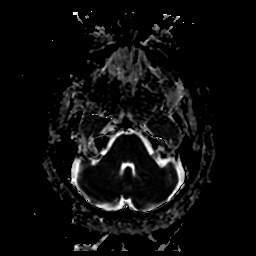
[im 15/50]
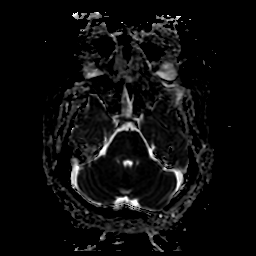
[im 17/50]
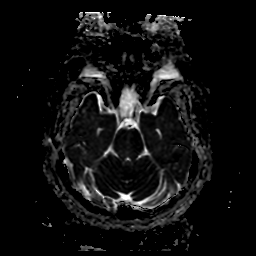
[im 19/50]
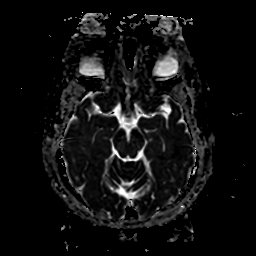
[im 20/50]
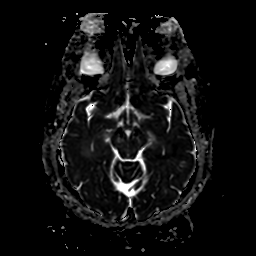
[im 22/50]
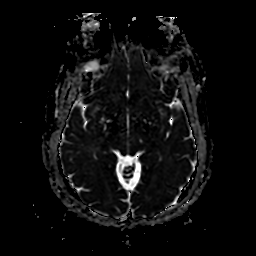
[im 24/50]
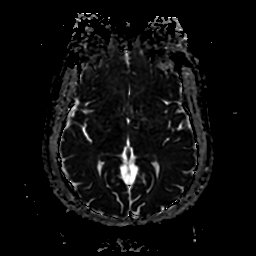
[im 26/50]
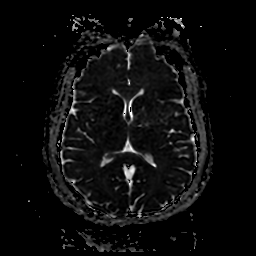
[im 28/50]
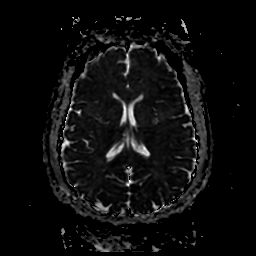
[im 30/50]
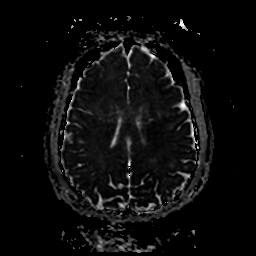
[im 31/50]
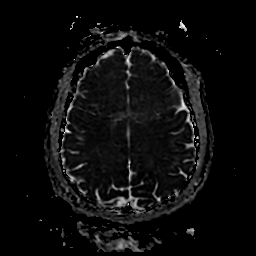
[im 33/50]
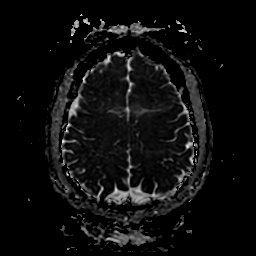
[im 35/50]
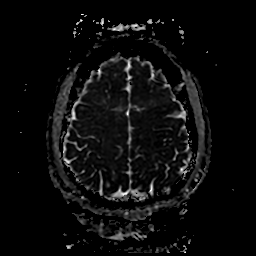
[im 37/50]
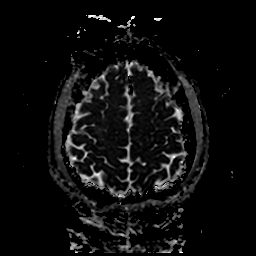
[im 39/50]
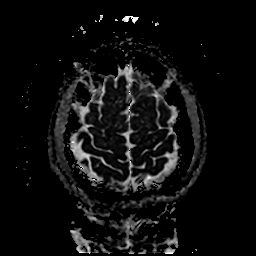
[im 40/50]
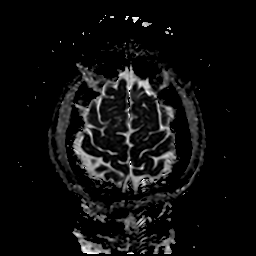
[im 42/50]
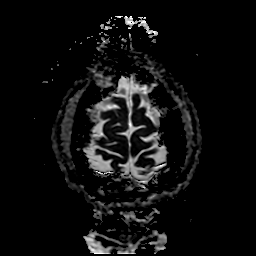
[im 44/50]
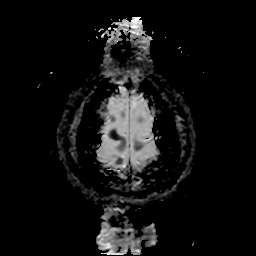
[im 46/50]
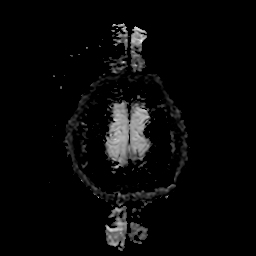
[im 48/50]
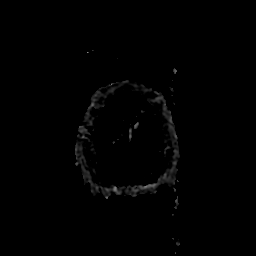
[im 50/50]
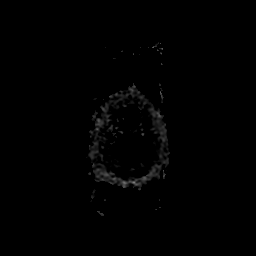

[Series 350: ADC · coronal · 4.0mm · 0.94mm/px · 20 of 37 slices shown (2 of 2)]
[im 1/37]
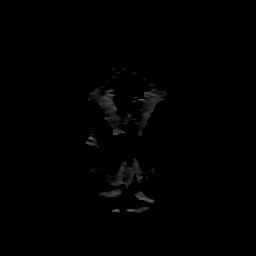
[im 2/37]
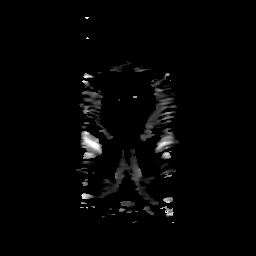
[im 4/37]
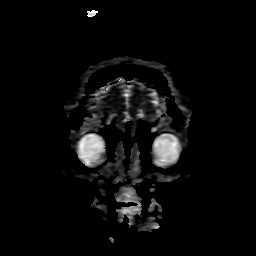
[im 6/37]
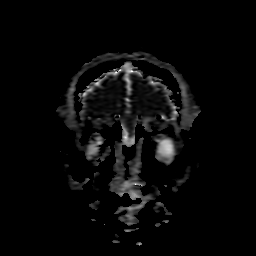
[im 8/37]
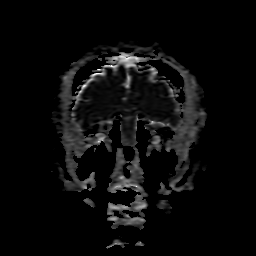
[im 10/37]
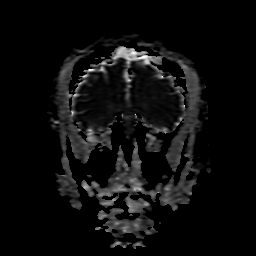
[im 12/37]
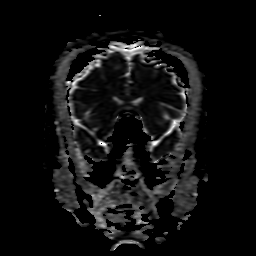
[im 14/37]
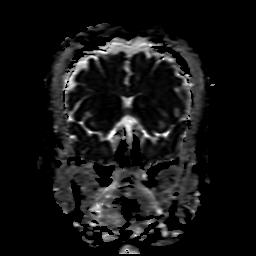
[im 16/37]
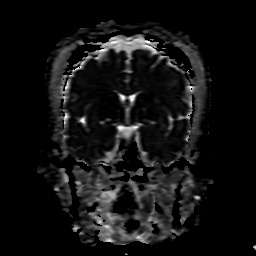
[im 18/37]
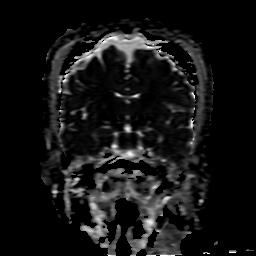
[im 19/37]
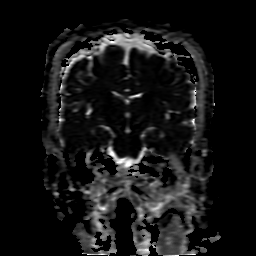
[im 21/37]
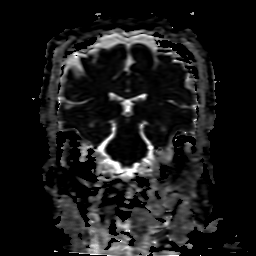
[im 23/37]
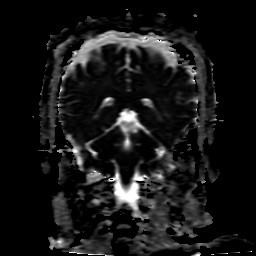
[im 25/37]
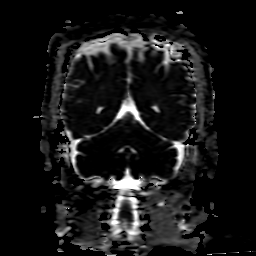
[im 27/37]
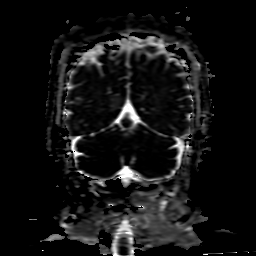
[im 29/37]
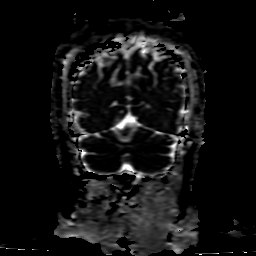
[im 31/37]
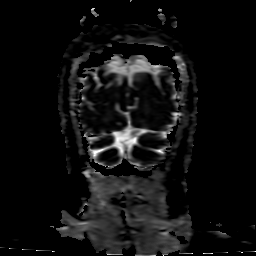
[im 33/37]
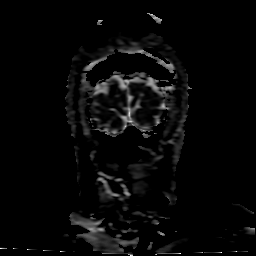
[im 35/37]
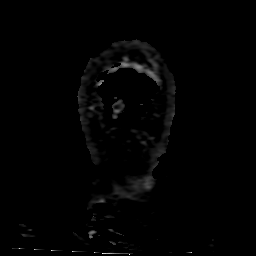
[im 37/37]
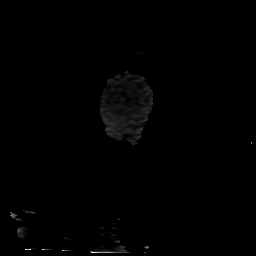

[48 of 48 positions shown; findings below may reference images not displayed]

FINDINGS: MRI HEAD FINDINGS

Brain: No acute infarct, mass effect or extra-axial collection. No
acute or chronic hemorrhage. Single focus of hyperintense
T2-weighted signal in the left subcortical white matter. Otherwise
normal parenchymal signal. A partially empty sella is incidentally
noted. There is no abnormal contrast enhancement.

Vascular: Major flow voids are preserved.

Skull and upper cervical spine: Normal calvarium and skull base.
Visualized upper cervical spine and soft tissues are normal.

Sinuses/Orbits:No paranasal sinus fluid levels or advanced mucosal
thickening. No mastoid or middle ear effusion. Normal orbits.

MRI CERVICAL SPINE FINDINGS

Alignment: Physiologic.

Vertebrae: No fracture, evidence of discitis, or bone lesion.

Cord: Normal signal and morphology.

Posterior Fossa, vertebral arteries, paraspinal tissues: Negative.

Disc levels:

C1-2: Unremarkable.

C2-3: Normal disc space and facet joints. There is no spinal canal
stenosis. No neural foraminal stenosis.

C3-4: Small central disc protrusion. Mild spinal canal stenosis. No
neural foraminal stenosis.

C4-5: Small central disc protrusion. Mild spinal canal stenosis. No
neural foraminal stenosis.

C5-6: Small disc bulge with left-greater-than-right uncovertebral
hypertrophy. Mild spinal canal stenosis. Mild left neural foraminal
stenosis.

C6-7: Left-greater-than-right uncovertebral hypertrophy. There is no
spinal canal stenosis. Mild left neural foraminal stenosis.

C7-T1: Normal disc space and facet joints. There is no spinal canal
stenosis. No neural foraminal stenosis.

MRI THORACIC SPINE FINDINGS

Alignment:  Physiologic.

Vertebrae: No fracture, evidence of discitis, or bone lesion.

Cord:  Normal signal and morphology.

Paraspinal and other soft tissues: Normal

Disc levels:

T5-6: Small central disc protrusion without stenosis.

T11-12: Minimal disc bulge without stenosis.

The other thoracic levels are unremarkable.
IMPRESSION: 1. Normal MRI of the brain for age.
2. Mild cervical degenerative disc disease with mild spinal canal
stenosis at the C3-4 through C5-6 levels.
3. Mild thoracic degenerative disc disease without spinal canal or
neural foraminal stenosis.

## 2021-06-17 IMAGING — MR MR HEAD WO/W CM
4 of 10 series · 21 of 48 positions shown · IV contrast (gadavist)
Comparison: None.

EXAM:
MRI HEAD WITHOUT AND WITH CONTRAST

MRI CERVICAL SPINE WITHOUT AND WITH CONTRAST
MRI CERVICAL THORACIC WITHOUT AND CONTRAST
CONTRAST:  8mL GADAVIST GADOBUTROL 1 MMOL/ML IV SOLN
TECHNIQUE: Multiplanar, multiecho pulse sequences of the brain and surrounding
structures, and cervical and thoracic spine were obtained without
and with intravenous contrast.

[Series 2: DWI · axial · 3.0mm · 0.94mm/px · z∈[-139,+0]mm · 9 of 96 slices shown (1 of 2)]
[im 1/96]
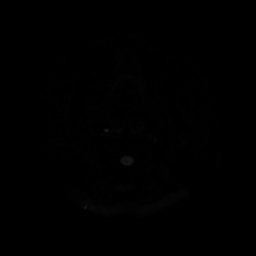
[im 12/96]
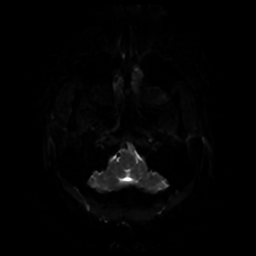
[im 24/96]
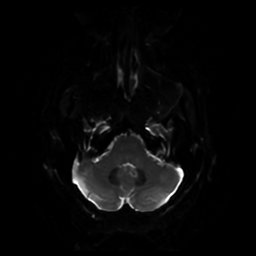
[im 36/96]
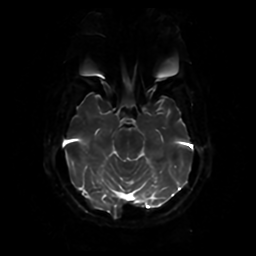
[im 48/96]
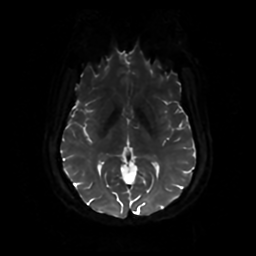
[im 60/96]
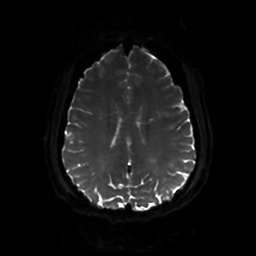
[im 72/96]
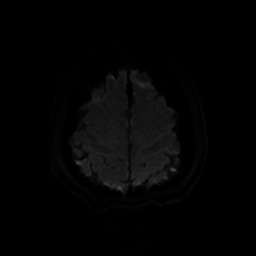
[im 84/96]
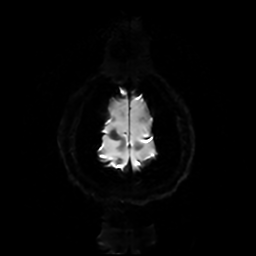
[im 96/96]
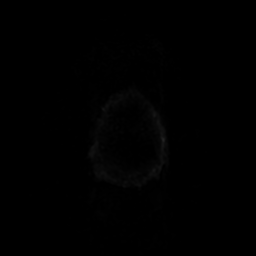

[Series 3: DWI · coronal · 4.0mm · 0.94mm/px · 7 of 74 slices shown (2 of 2)]
[im 1/74]
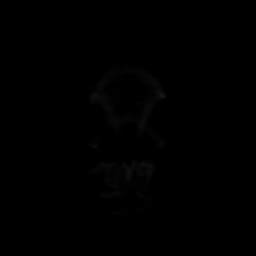
[im 13/74]
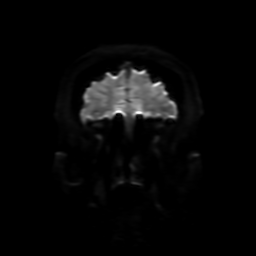
[im 25/74]
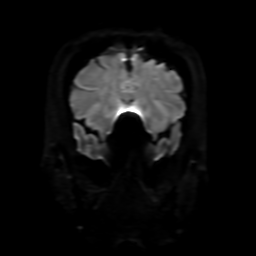
[im 37/74]
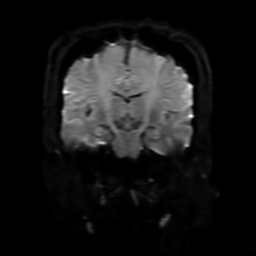
[im 49/74]
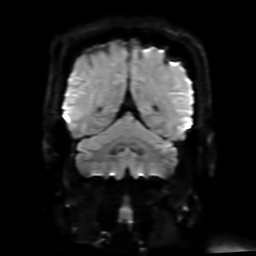
[im 61/74]
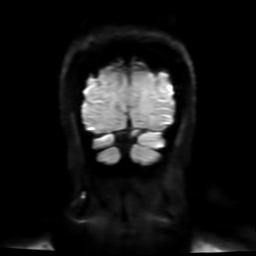
[im 74/74]
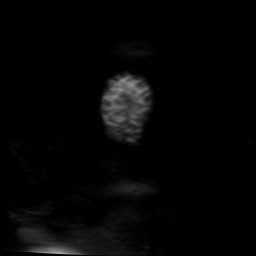

[Series 4: FLAIR · sagittal · 5.0mm · 0.23mm/px · 2 of 23 slices shown (1 of 2)]
[im 1/23]
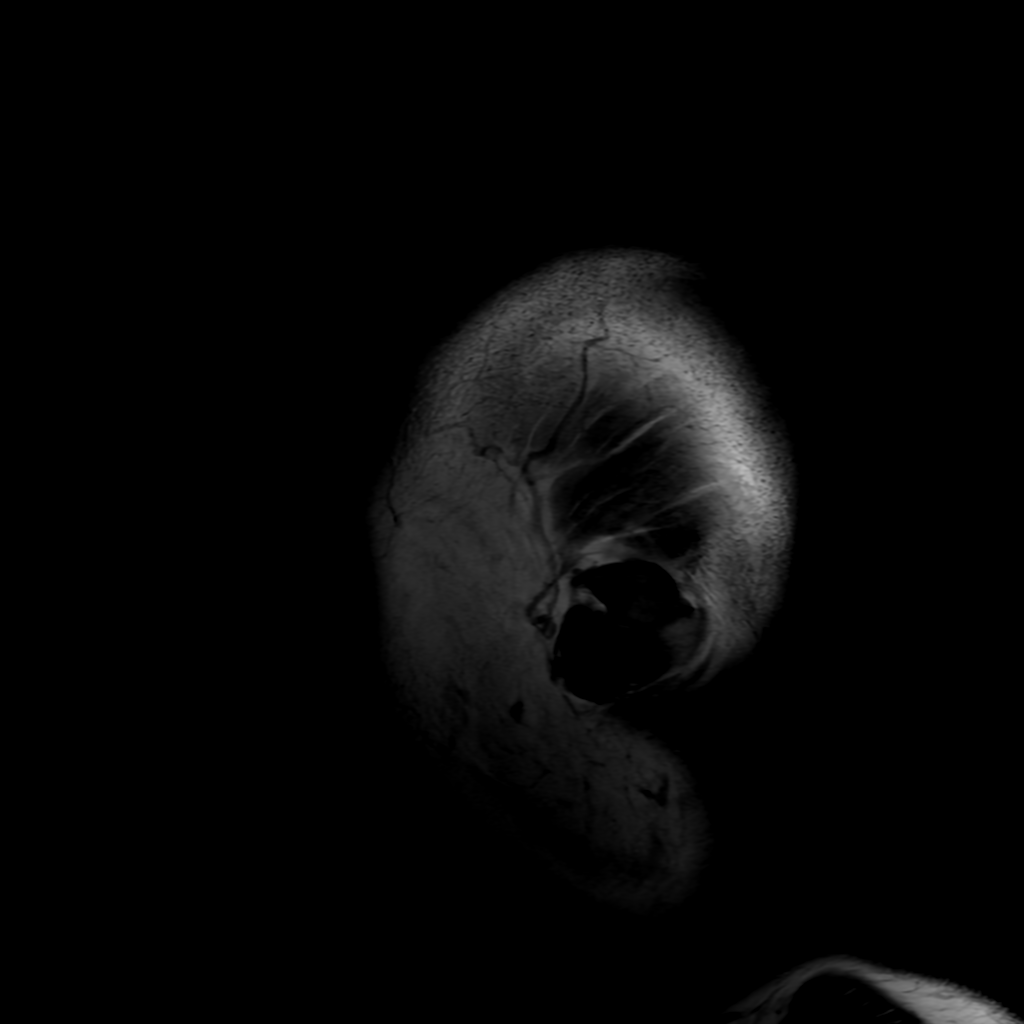
[im 23/23]
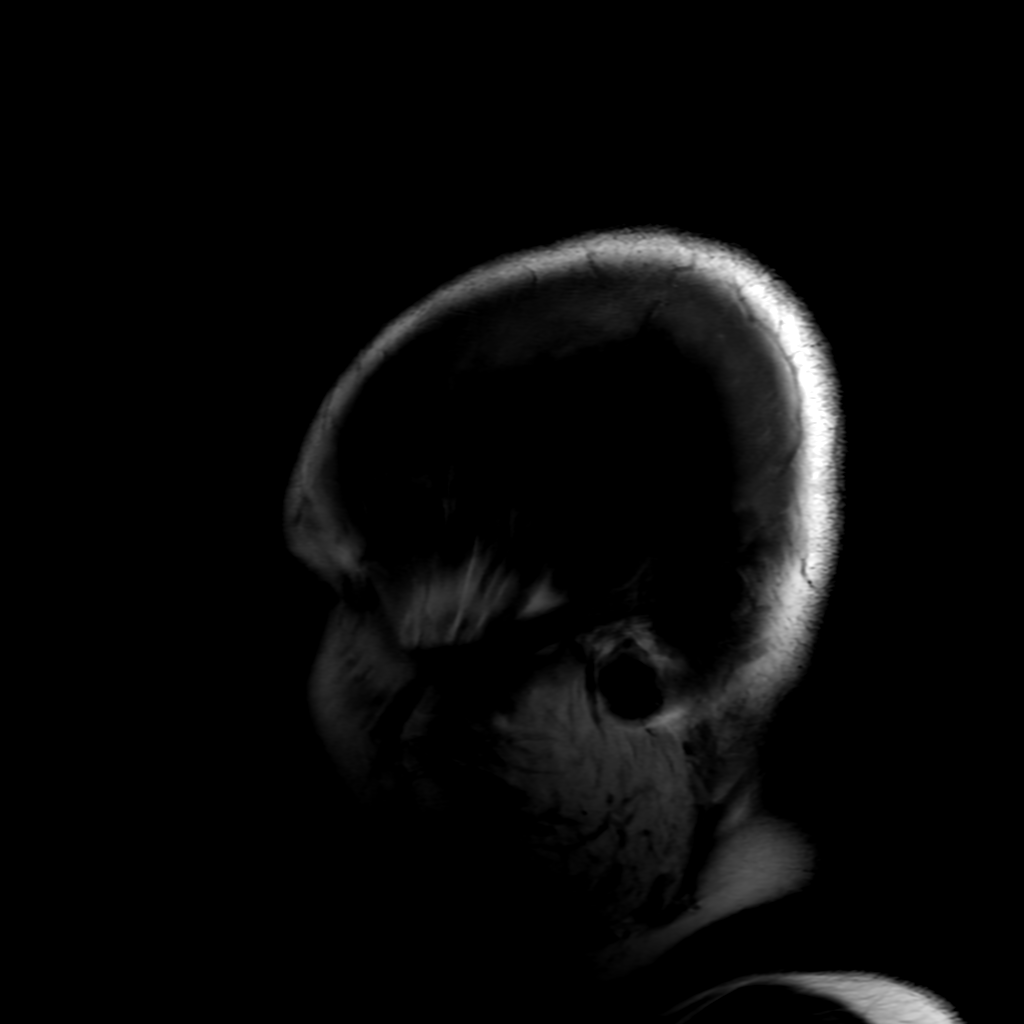

[Series 6: FLAIR · axial · 4.0mm · 0.45mm/px · z∈[-137,+1]mm · 3 of 34 slices shown (2 of 2)]
[im 1/34]
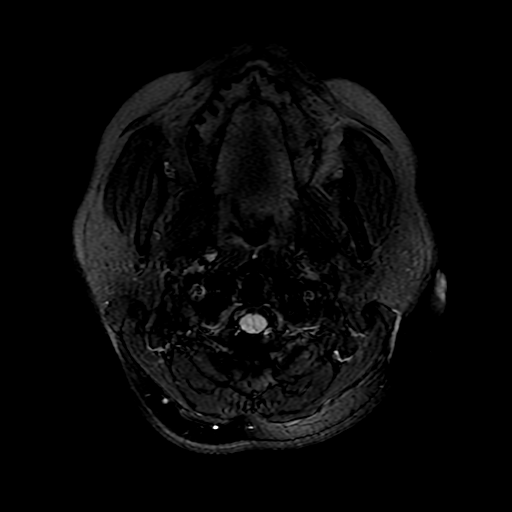
[im 17/34]
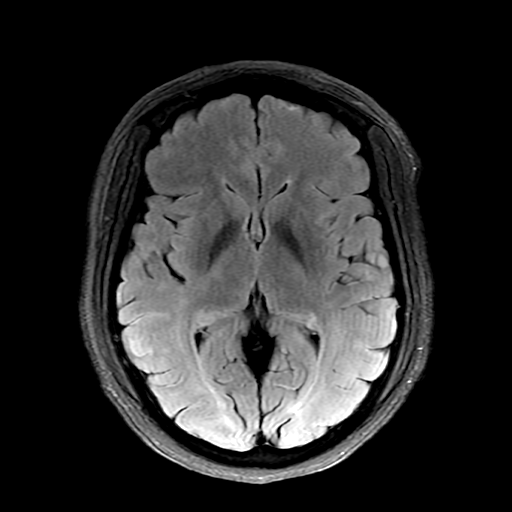
[im 34/34]
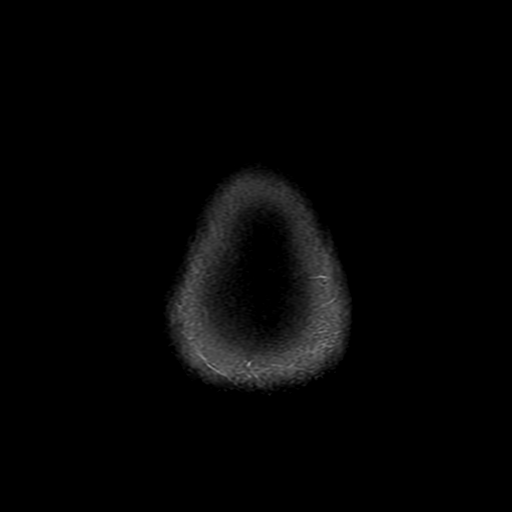

[21 of 48 positions shown; findings below may reference images not displayed]

FINDINGS: MRI HEAD FINDINGS

Brain: No acute infarct, mass effect or extra-axial collection. No
acute or chronic hemorrhage. Single focus of hyperintense
T2-weighted signal in the left subcortical white matter. Otherwise
normal parenchymal signal. A partially empty sella is incidentally
noted. There is no abnormal contrast enhancement.

Vascular: Major flow voids are preserved.

Skull and upper cervical spine: Normal calvarium and skull base.
Visualized upper cervical spine and soft tissues are normal.

Sinuses/Orbits:No paranasal sinus fluid levels or advanced mucosal
thickening. No mastoid or middle ear effusion. Normal orbits.

MRI CERVICAL SPINE FINDINGS

Alignment: Physiologic.

Vertebrae: No fracture, evidence of discitis, or bone lesion.

Cord: Normal signal and morphology.

Posterior Fossa, vertebral arteries, paraspinal tissues: Negative.

Disc levels:

C1-2: Unremarkable.

C2-3: Normal disc space and facet joints. There is no spinal canal
stenosis. No neural foraminal stenosis.

C3-4: Small central disc protrusion. Mild spinal canal stenosis. No
neural foraminal stenosis.

C4-5: Small central disc protrusion. Mild spinal canal stenosis. No
neural foraminal stenosis.

C5-6: Small disc bulge with left-greater-than-right uncovertebral
hypertrophy. Mild spinal canal stenosis. Mild left neural foraminal
stenosis.

C6-7: Left-greater-than-right uncovertebral hypertrophy. There is no
spinal canal stenosis. Mild left neural foraminal stenosis.

C7-T1: Normal disc space and facet joints. There is no spinal canal
stenosis. No neural foraminal stenosis.

MRI THORACIC SPINE FINDINGS

Alignment:  Physiologic.

Vertebrae: No fracture, evidence of discitis, or bone lesion.

Cord:  Normal signal and morphology.

Paraspinal and other soft tissues: Normal

Disc levels:

T5-6: Small central disc protrusion without stenosis.

T11-12: Minimal disc bulge without stenosis.

The other thoracic levels are unremarkable.
IMPRESSION: 1. Normal MRI of the brain for age.
2. Mild cervical degenerative disc disease with mild spinal canal
stenosis at the C3-4 through C5-6 levels.
3. Mild thoracic degenerative disc disease without spinal canal or
neural foraminal stenosis.

## 2021-06-17 IMAGING — CT CT HEAD W/O CM
3 series · 16 of 47 positions shown, 19 images · non-contrast
Comparison: [DATE]

CLINICAL DATA: Intermittent left arm numbness, dizziness



[Series 2: head wo · axial · 0.40mm/px · z∈[-177,-47]mm · 10 of 32 slices shown, 13 images]
[im 3/32  brain]
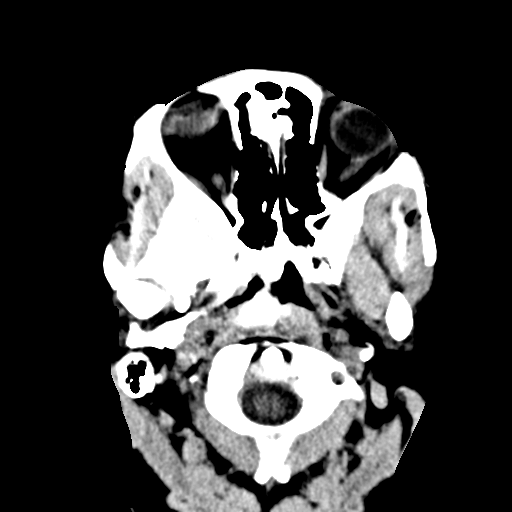
[im 3/32  bone]
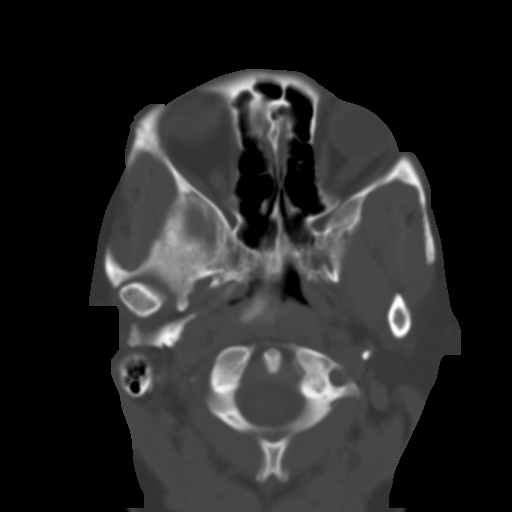
[im 6/32  brain]
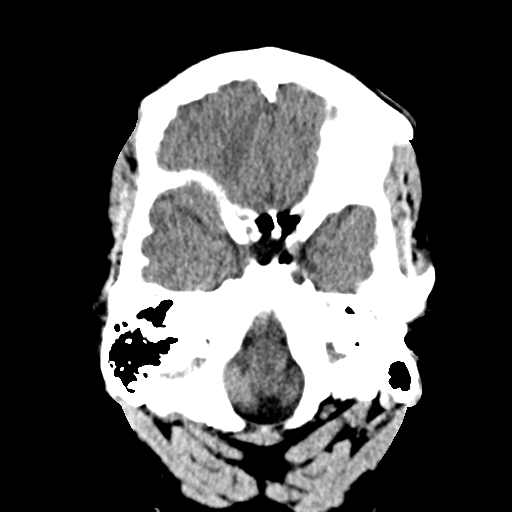
[im 9/32  brain]
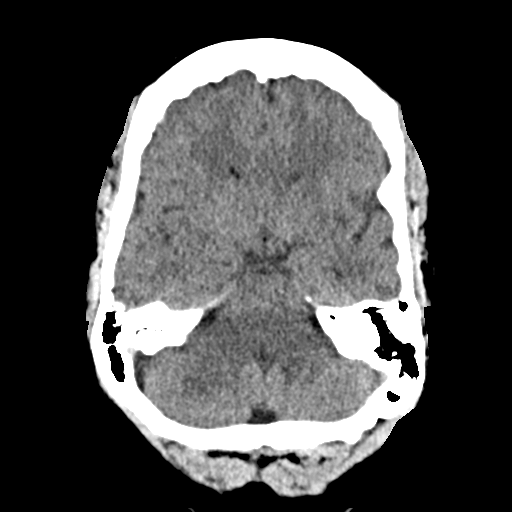
[im 11/32  brain]
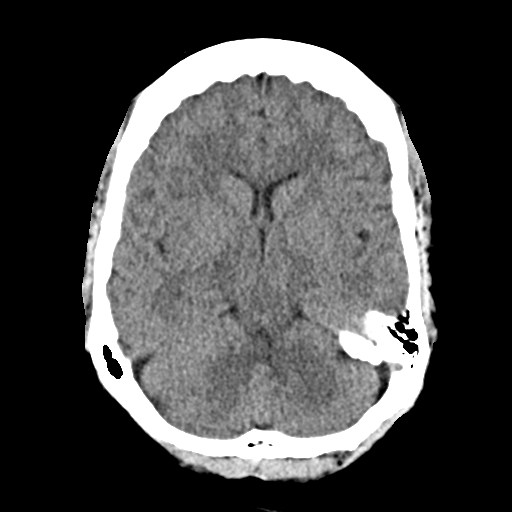
[im 14/32  brain]
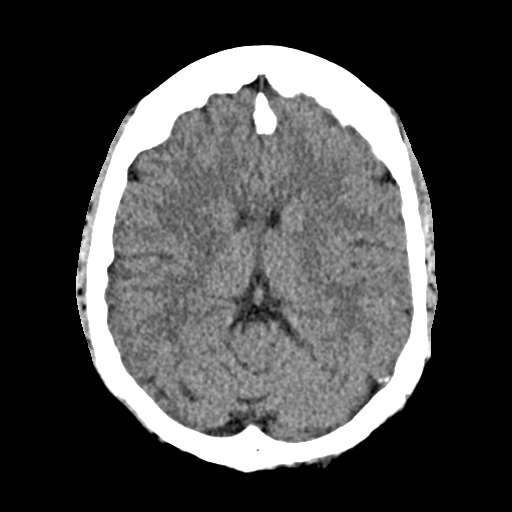
[im 14/32  bone]
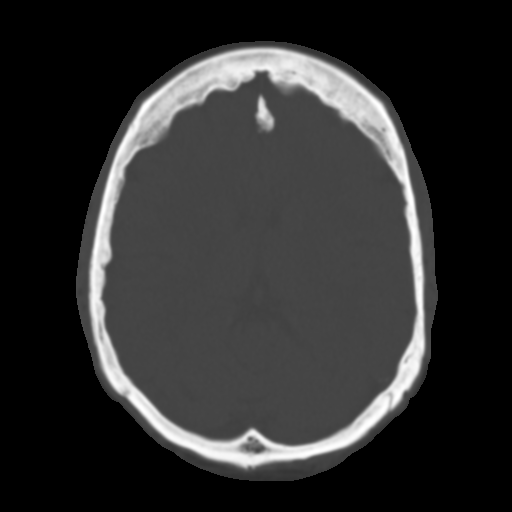
[im 18/32  brain]
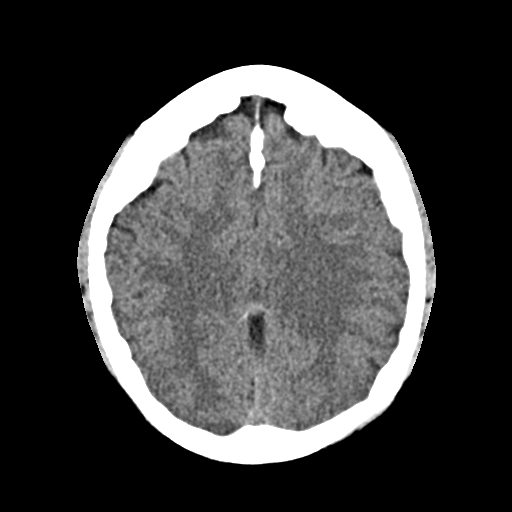
[im 21/32  brain]
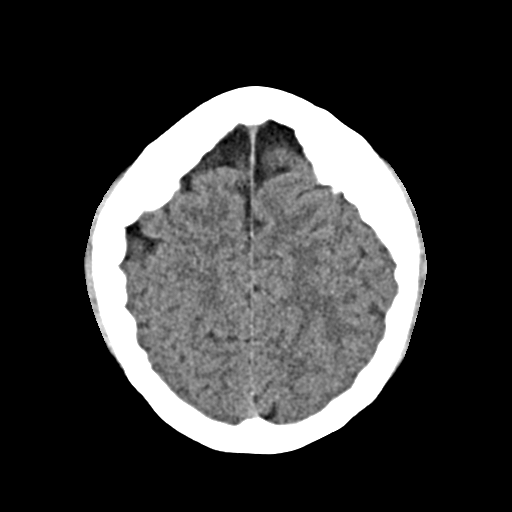
[im 24/32  brain]
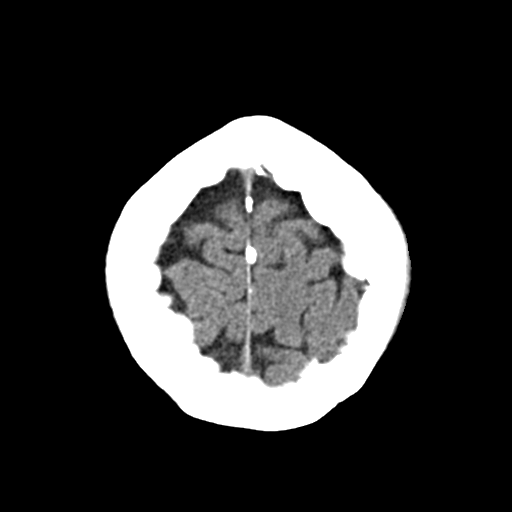
[im 26/32  brain]
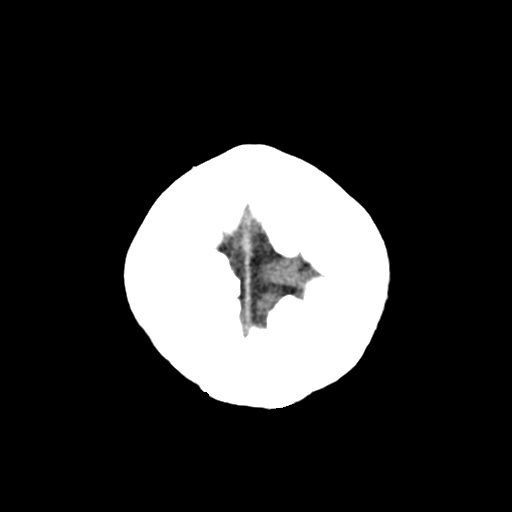
[im 26/32  bone]
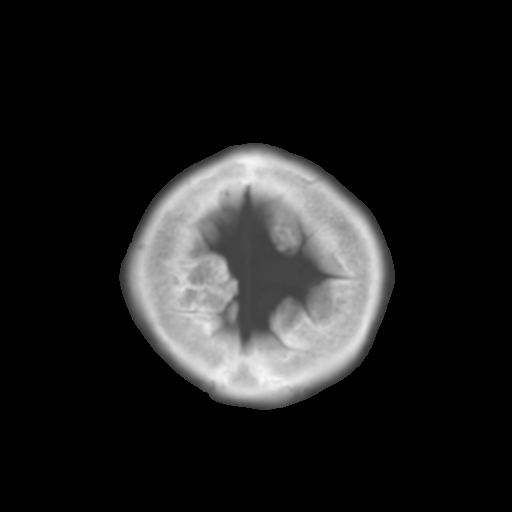
[im 29/32  brain]
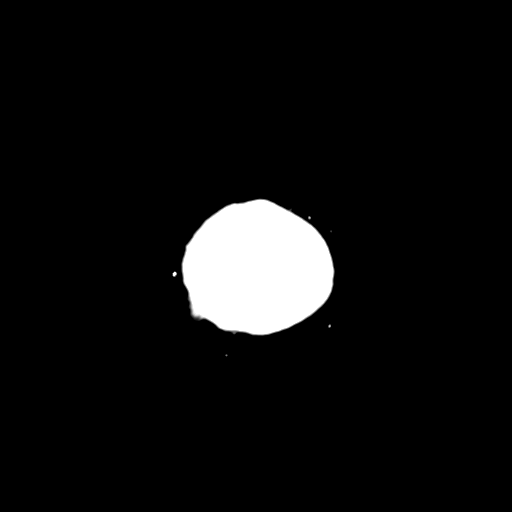

[Series 4: coronal soft · coronal · 0.32mm/px · 3 of 67 slices shown]
[im 23/67  brain]
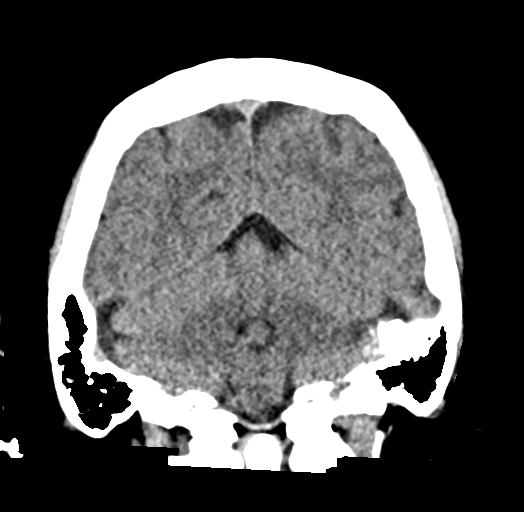
[im 30/67  brain]
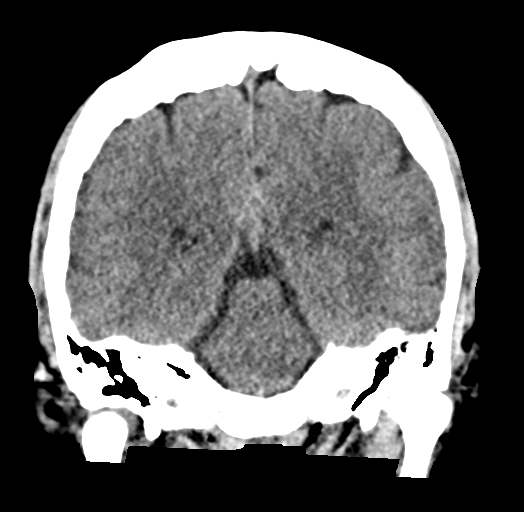
[im 37/67  brain]
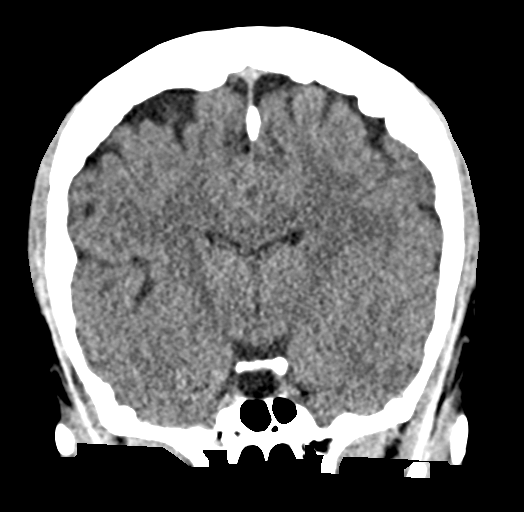

[Series 5: sag soft · sagittal · 0.32mm/px · 3 of 55 slices shown]
[im 19/55  brain]
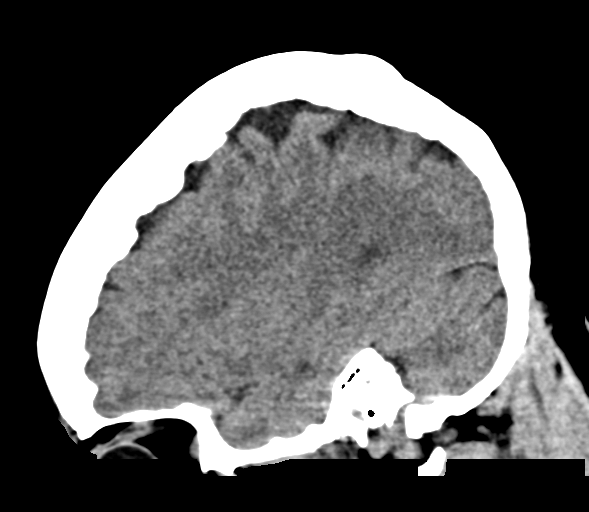
[im 28/55  brain]
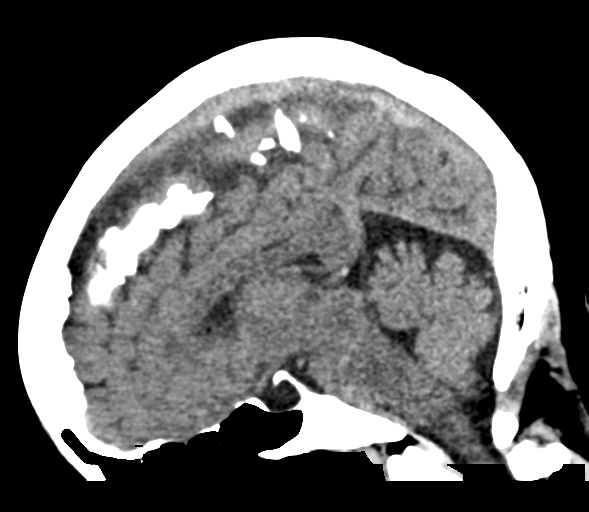
[im 37/55  brain]
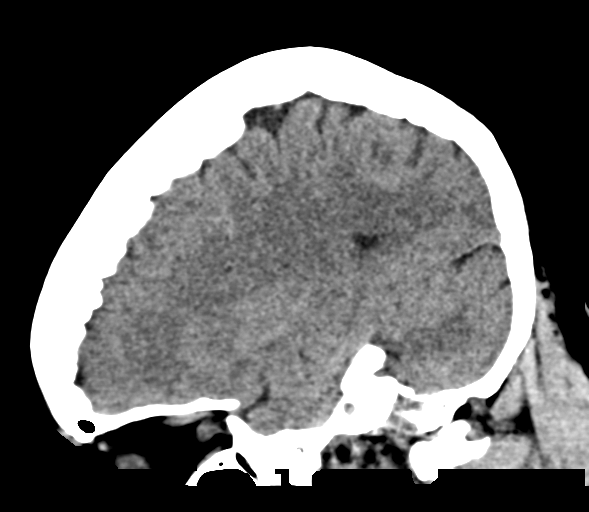

[16 of 47 positions shown; findings below may reference images not displayed]

FINDINGS: Brain: No acute intracranial findings are seen. Ventricles are not
dilated. There is no shift of midline structures. There are no
epidural or subdural fluid collections.

Vascular: Unremarkable.

Skull: Hyperostosis frontalis interna is seen.

Sinuses/Orbits: Unremarkable.

Other: There is increased amount of CSF insula suggesting partial
empty sella.
IMPRESSION: No acute intracranial findings are seen in noncontrast CT brain.

## 2021-06-17 IMAGING — MR MR THORACIC SPINE WO/W CM
5 of 9 series · 18 of 48 positions shown · IV contrast (gadavist)
Comparison: None.

EXAM:
MRI HEAD WITHOUT AND WITH CONTRAST

MRI CERVICAL SPINE WITHOUT AND WITH CONTRAST
MRI CERVICAL THORACIC WITHOUT AND CONTRAST
CONTRAST:  8mL GADAVIST GADOBUTROL 1 MMOL/ML IV SOLN
TECHNIQUE: Multiplanar, multiecho pulse sequences of the brain and surrounding
structures, and cervical and thoracic spine were obtained without
and with intravenous contrast.

[Series 18: T1 · sagittal · 3.0mm · 0.90mm/px · 1 of 12 slices shown (1 of 3)]
[im 1/12]
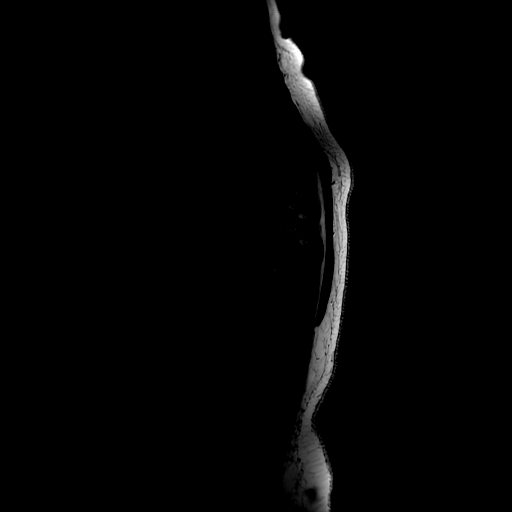

[Series 19: T2 · sagittal · 3.0mm · 0.66mm/px · 2 of 15 slices shown (1 of 2)]
[im 1/15]
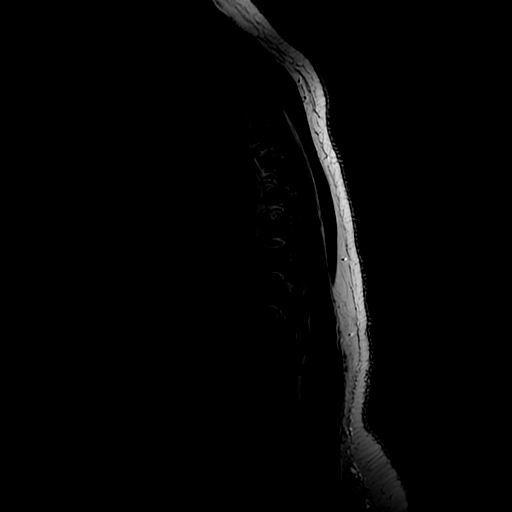
[im 15/15]
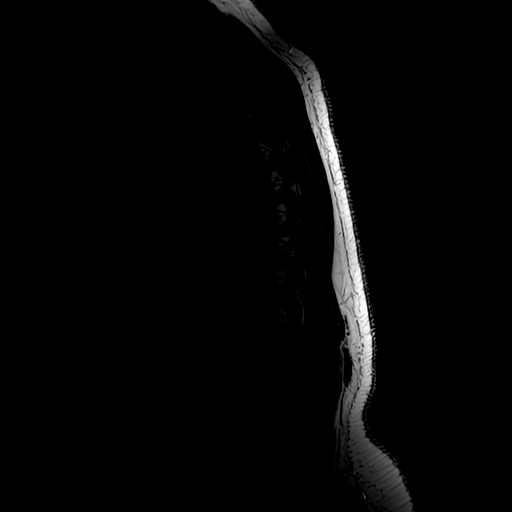

[Series 21: T1 · sagittal · 3.0mm · 0.66mm/px · 3 of 15 slices shown (2 of 3)]
[im 1/15]
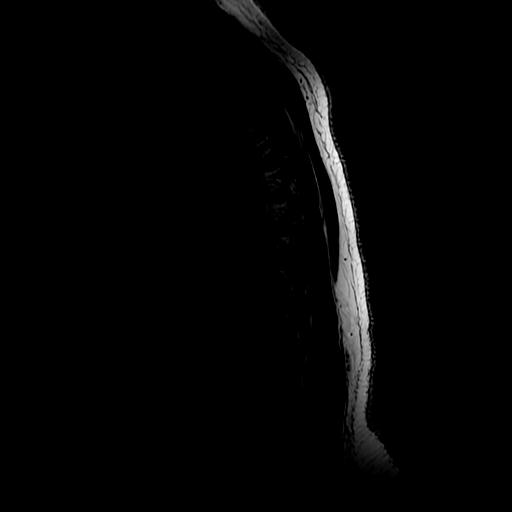
[im 8/15]
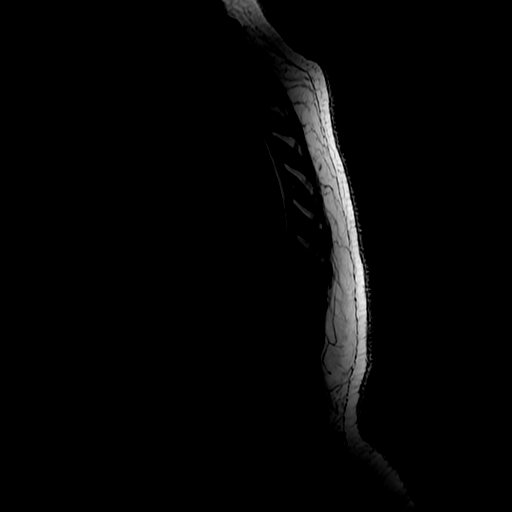
[im 15/15]
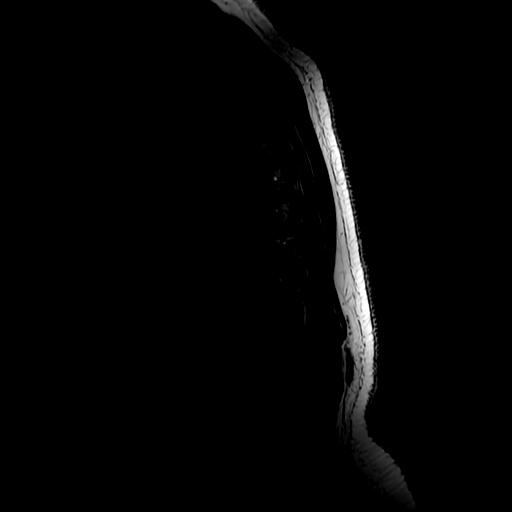

[Series 22: T2 · axial · 4.0mm · 0.39mm/px · z∈[-387,-206]mm · 7 of 39 slices shown (2 of 2)]
[im 1/39]
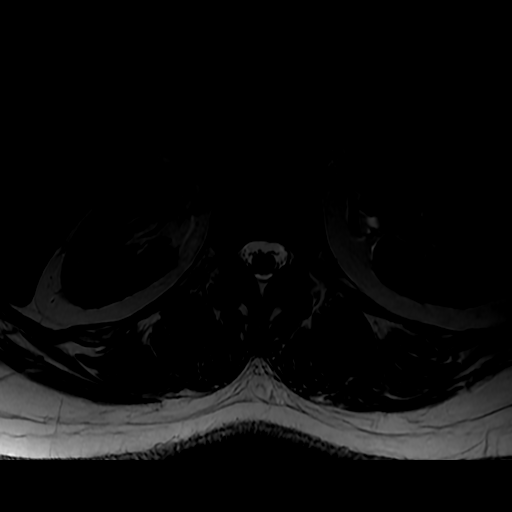
[im 7/39]
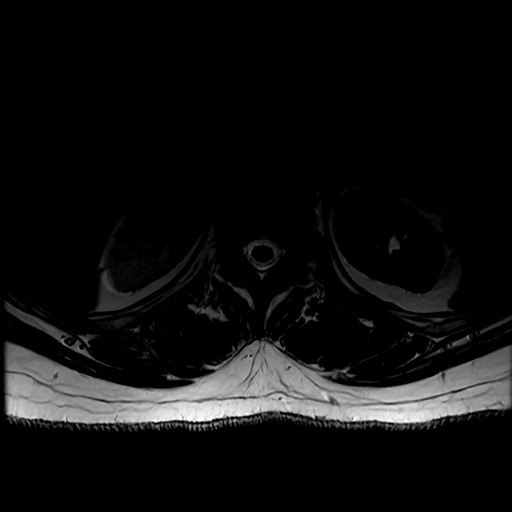
[im 13/39]
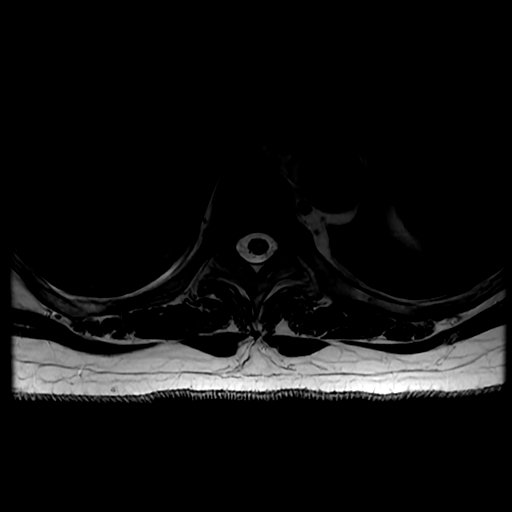
[im 20/39]
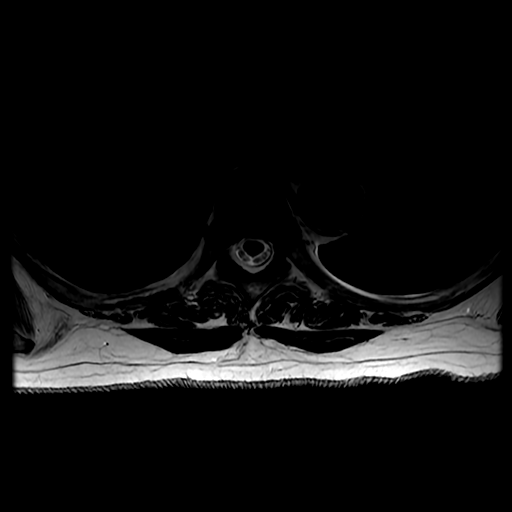
[im 26/39]
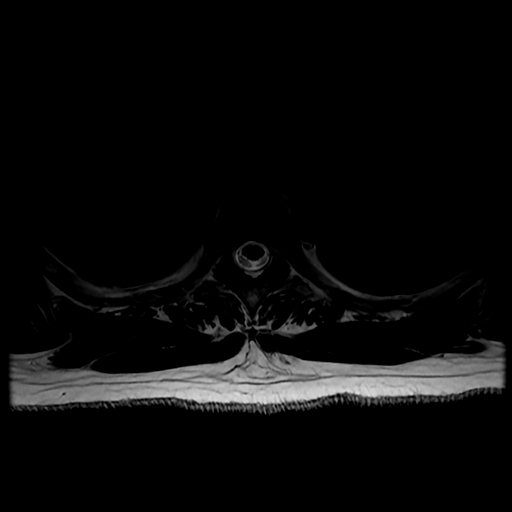
[im 32/39]
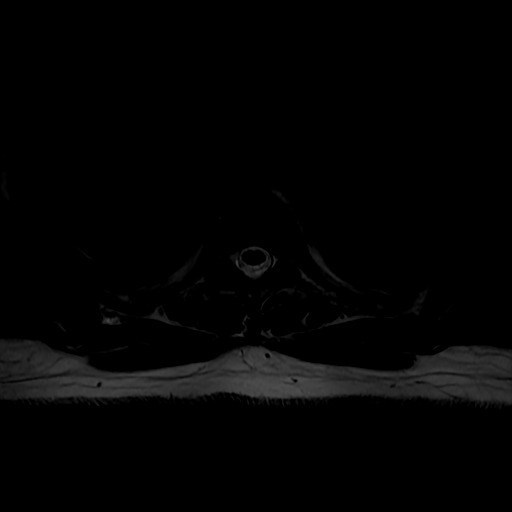
[im 39/39]
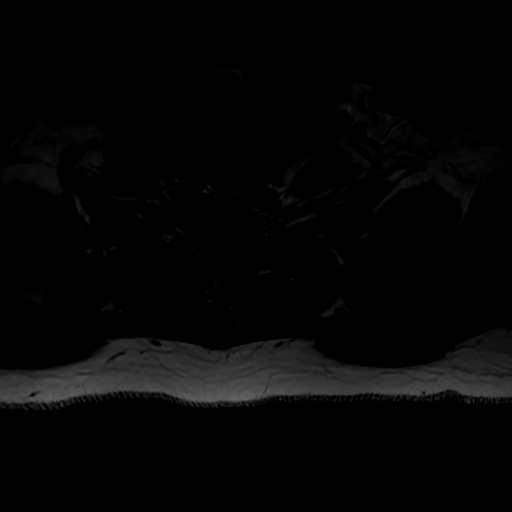

[Series 24: T1 · axial · non-contrast · 3.0mm · 0.39mm/px · z∈[-389,-270]mm · 5 of 65 slices shown (3 of 3)]
[im 1/65]
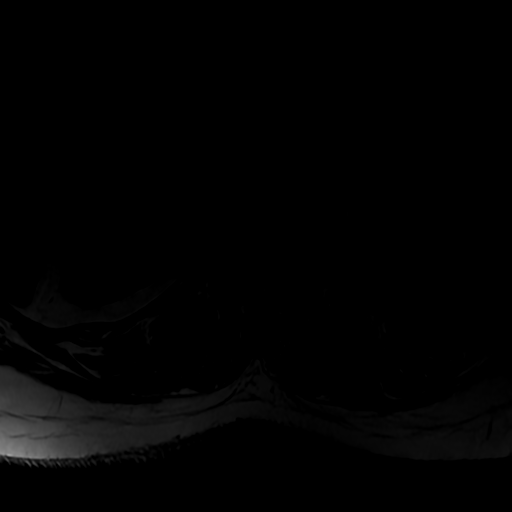
[im 13/65]
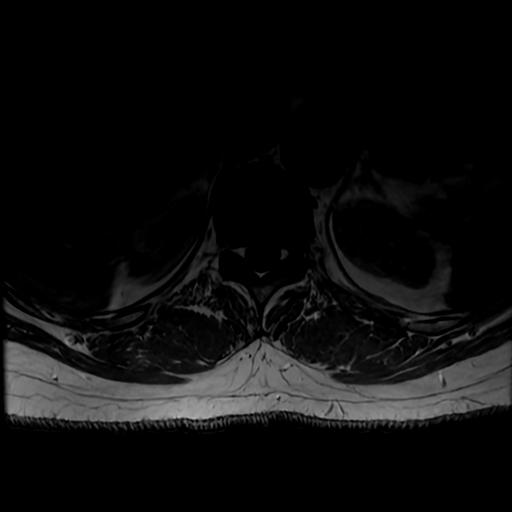
[im 20/65]
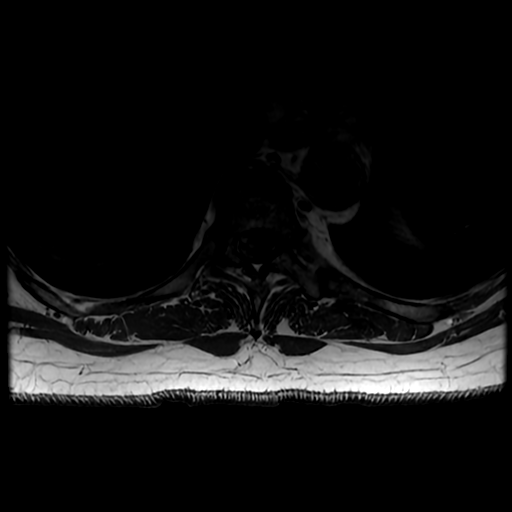
[im 26/65]
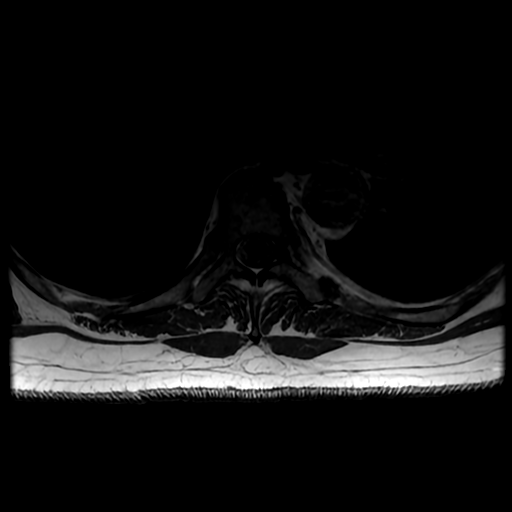
[im 39/65]
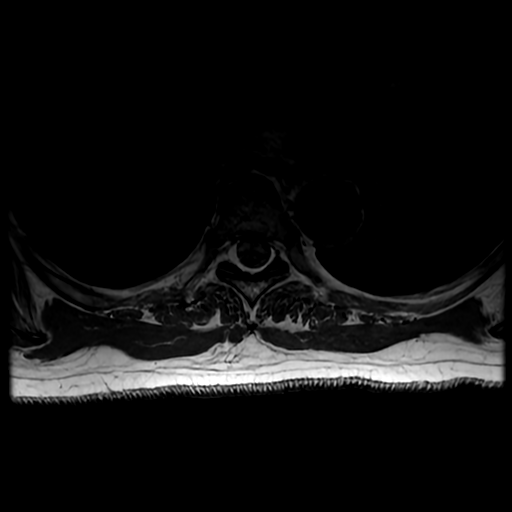

[18 of 48 positions shown; findings below may reference images not displayed]

FINDINGS: MRI HEAD FINDINGS

Brain: No acute infarct, mass effect or extra-axial collection. No
acute or chronic hemorrhage. Single focus of hyperintense
T2-weighted signal in the left subcortical white matter. Otherwise
normal parenchymal signal. A partially empty sella is incidentally
noted. There is no abnormal contrast enhancement.

Vascular: Major flow voids are preserved.

Skull and upper cervical spine: Normal calvarium and skull base.
Visualized upper cervical spine and soft tissues are normal.

Sinuses/Orbits:No paranasal sinus fluid levels or advanced mucosal
thickening. No mastoid or middle ear effusion. Normal orbits.

MRI CERVICAL SPINE FINDINGS

Alignment: Physiologic.

Vertebrae: No fracture, evidence of discitis, or bone lesion.

Cord: Normal signal and morphology.

Posterior Fossa, vertebral arteries, paraspinal tissues: Negative.

Disc levels:

C1-2: Unremarkable.

C2-3: Normal disc space and facet joints. There is no spinal canal
stenosis. No neural foraminal stenosis.

C3-4: Small central disc protrusion. Mild spinal canal stenosis. No
neural foraminal stenosis.

C4-5: Small central disc protrusion. Mild spinal canal stenosis. No
neural foraminal stenosis.

C5-6: Small disc bulge with left-greater-than-right uncovertebral
hypertrophy. Mild spinal canal stenosis. Mild left neural foraminal
stenosis.

C6-7: Left-greater-than-right uncovertebral hypertrophy. There is no
spinal canal stenosis. Mild left neural foraminal stenosis.

C7-T1: Normal disc space and facet joints. There is no spinal canal
stenosis. No neural foraminal stenosis.

MRI THORACIC SPINE FINDINGS

Alignment:  Physiologic.

Vertebrae: No fracture, evidence of discitis, or bone lesion.

Cord:  Normal signal and morphology.

Paraspinal and other soft tissues: Normal

Disc levels:

T5-6: Small central disc protrusion without stenosis.

T11-12: Minimal disc bulge without stenosis.

The other thoracic levels are unremarkable.
IMPRESSION: 1. Normal MRI of the brain for age.
2. Mild cervical degenerative disc disease with mild spinal canal
stenosis at the C3-4 through C5-6 levels.
3. Mild thoracic degenerative disc disease without spinal canal or
neural foraminal stenosis.

## 2021-06-17 MED ORDER — GADOBUTROL 1 MMOL/ML IV SOLN
8.0000 mL | Freq: Once | INTRAVENOUS | Status: AC | PRN
  Administered 2021-06-17: 8 mL via INTRAVENOUS

## 2021-06-17 MED ORDER — SODIUM CHLORIDE 0.9 % IV SOLN: Freq: Once | INTRAVENOUS | Status: AC

## 2021-06-17 NOTE — ED Notes (Signed)
Patient transported to MRI 

## 2021-06-17 NOTE — ED Provider Notes (Signed)
I assumed care of the patient.  Patient with some difficulty walking and dizziness has a history of headaches that have been worsening.  She has been seen by [facility] for [facility].  Case was discussed with neurology and plan for MRI of the brain C and T-spine with and without contrast.  These have resulted and show no acute finding.  I discussed results with the family.  We will have them follow-up with their neurosurgeon. ?  Melene Plan, DO ?06/17/21 2157 ? ?

## 2021-06-17 NOTE — ED Provider Notes (Signed)
[facility] EMERGENCY DEPARTMENT Provider Note   CSN: [id number] Arrival date & time: 06/17/21  1131     History  Chief Complaint  Patient presents with   Numbness    Kathleen Foley is a 56 y.o. female.  HPI Patient has a history complicated by hearing loss that is currently being evaluated with anticipated cochlear implant placement, intermittent left upper extremity paresthesia and episodic dizziness with lancinating headache.  Patient has experienced hearing loss and is assisted by lipreading.  She reports she is also have problems with erratic dizziness.  Patient reports that she had been awake since about 6 AM.  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  She reports extremity felt numb and the fingertips were tingly.  She had also gotten a pain in her thoracic back about that same time.  Pain was self-limited.  She reports that she then went on to develop some tingling in her right fingers not as much numbness as she had had in the left arm.  She reports at about the same time she is experiencing the numbness in the left arm she also perceived numbness in the left leg.  She qualifies that she has had some problems with her low back and hip and had MRIs of her lumbar spine and hip but denies that the numbness she experienced in conjunction with her left upper extremity numbness was pre-existing.  She reports after about 45 minutes the symptoms of numbness seem to resolve.  She still perceived a slight difference on the right to left but none in the legs upon arrival.    Home Medications Prior to Admission medications   Medication Sig Start Date End Date Taking? Authorizing Provider  albuterol (PROVENTIL) (2.5 MG/3ML) 0.083% nebulizer solution Inhale into the lungs. 05/03/19   [provider]  amLODipine (NORVASC) 2.5 MG tablet TAKE ONE TABLET IN THE MORNING 11/12/20   [provider]  budesonide-formoterol  (SYMBICORT) 160-4.5 MCG/ACT inhaler Inhale 2 puffs into the lungs 2 (two) times daily. Patient not taking: Reported on 04/21/2021 08/01/18   [provider]  cetirizine (ZYRTEC) 10 MG tablet TAKE ONE TABLET IN THE MORNING 11/12/20   [provider]  Cholecalciferol 50 MCG (2000 UT) CAPS Take by mouth daily. Patient not taking: Reported on 04/21/2021 02/15/18   [provider]  dexlansoprazole (DEXILANT) 60 MG capsule Take 1 capsule by mouth every morning. 11/12/20   [provider]  escitalopram (LEXAPRO) 20 MG tablet TAKE ONE TABLET IN THE MORNING 09/16/20   [provider]  gabapentin (NEURONTIN) 300 MG capsule SMARTSIG:1 Capsule(s) By Mouth Every Evening 11/12/20   [provider]  GOODSENSE ASPIRIN LOW DOSE 81 MG EC tablet TAKE ONE TABLET IN THE MORNING 10/13/20   [provider]  loperamide (IMODIUM) 2 MG capsule Take 1 capsule (2 mg total) by mouth 4 (four) times daily as needed for diarrhea or loose stools. 05/02/21   Terrilee Files, MD  meloxicam (MOBIC) 15 MG tablet TAKE ONE TABLET IN THE MORNING 11/12/20   [provider]  metoprolol tartrate (LOPRESSOR) 100 MG tablet Take 1 tablet (100 mg total) by mouth once for 1 dose. Take 90-120 minutes prior to scan. 04/21/21 04/21/21  Meriam Sprague, MD  montelukast (SINGULAIR) 10 MG tablet SMARTSIG:1 Tablet(s) By Mouth Every Evening 11/12/20   [provider]  oxybutynin (DITROPAN-XL) 10 MG 24 hr tablet TAKE ONE TABLET IN THE MORNING 10/14/20  [provider]  oxyCODONE-acetaminophen (PERCOCET/ROXICET) 5-325 MG tablet Take 1 tablet by mouth every 6 (six) hours as needed for severe pain. 05/02/21   Terrilee Files, MD  pravastatin (PRAVACHOL) 20 MG tablet SMARTSIG:1 Tablet(s) By Mouth Every Evening 11/12/20   [provider]  Prenatal Vit-Fe Fumarate-FA (PRENATAL MULTIVITAMIN) TABS tablet Take 1 tablet by mouth daily at 12 noon.    [provider]   RESTASIS 0.05 % ophthalmic emulsion 1 drop 2 (two) times daily. 11/12/20   [provider]  spironolactone (ALDACTONE) 25 MG tablet Take 1 tablet (25 mg total) by mouth daily. 04/21/21   Meriam Sprague, MD      Allergies    Hydrocodone and Latex    Review of Systems   Review of Systems 10 systems reviewed and negative except as per HPI Physical Exam Updated Vital Signs BP (!) 144/90    Pulse 62    Resp 18    SpO2 100%  Physical Exam Constitutional:      Comments: Alert nontoxic no respiratory distress.  Pleasant and interactive.  HENT:     Head: Normocephalic and atraumatic.     Nose: Nose normal.  Eyes:     Extraocular Movements: Extraocular movements intact.     Pupils: Pupils are equal, round, and reactive to light.  Cardiovascular:     Rate and Rhythm: Normal rate and regular rhythm.  Pulmonary:     Effort: Pulmonary effort is normal.     Breath sounds: Normal breath sounds.  Abdominal:     General: There is no distension.     Palpations: Abdomen is soft.     Tenderness: There is no abdominal tenderness. There is no guarding.  Musculoskeletal:        General: No swelling or tenderness. Normal range of motion.     Cervical back: Neck supple.     Right lower leg: No edema.     Left lower leg: No edema.  Skin:    General: Skin is warm and dry.  Neurological:     Comments: GCS 15.  Patient does not appear to have any expressive or receptive aphasia.  She does do better if she can look directly at me when speaking.  Comprehension and seems to be normal.  Cranial nerves are intact but she does seem to have a predilection for decreased facial movement on the left when talking spontaneously.  Motor strength 5\5 for upper and lower extremities.  Patient endorses some slight sensory differential to light touch on the left upper extremity versus the right.  Patient does not endorse differential to light touch on the lower extremities.  Psychiatric:        Mood and  Affect: Mood normal.     Comments: Pleasant and interactive    ED Results / Procedures / Treatments   Labs (all labs ordered are listed, but only abnormal results are displayed) Labs Reviewed  CBC - Abnormal; Notable for the following components:      Result Value   RBC 3.56 (*)    Hemoglobin 11.5 (*)    HCT 34.2 (*)    All other components within normal limits  COMPREHENSIVE METABOLIC PANEL - Abnormal; Notable for the following components:   Glucose, Bld 125 (*)    Creatinine, Ser 1.20 (*)    Calcium 8.7 (*)    GFR, Estimated 52 (*)    All other components within normal limits  URINALYSIS, ROUTINE W REFLEX MICROSCOPIC - Abnormal; Notable for  the following components:   Specific Gravity, Urine >1.030 (*)    Leukocytes,Ua SMALL (*)    All other components within normal limits  URINALYSIS, MICROSCOPIC (REFLEX) - Abnormal; Notable for the following components:   Bacteria, UA MANY (*)    All other components within normal limits  RESP PANEL BY RT-PCR (FLU A&B, COVID) ARPGX2  PROTIME-INR  APTT  DIFFERENTIAL  RAPID URINE DRUG SCREEN, HOSP PERFORMED    EKG EKG Interpretation  Date/Time:  Wednesday June 17 2021 11:39:56 EST Ventricular Rate:  83 PR Interval:  152 QRS Duration: 70 QT Interval:  340 QTC Calculation: 400 R Axis:   64 Text Interpretation: Sinus rhythm Probable left atrial enlargement Borderline abnrm T, anterolateral leads no sig change from previous Confirmed by Arby BarrettePfeiffer, Antone Summons [id number]) on 06/17/2021 3:35:39 PM  Radiology CT HEAD WO CONTRAST  Result Date: 06/17/2021 CLINICAL DATA:  Intermittent left arm numbness, dizziness EXAM: CT HEAD WITHOUT CONTRAST TECHNIQUE: Contiguous axial images were obtained from the base of the skull through the vertex without intravenous contrast. RADIATION DOSE REDUCTION: This exam was performed according to the departmental dose-optimization program which includes automated exposure control, adjustment of the mA and/or kV according to  patient size and/or use of iterative reconstruction technique. COMPARISON:  09/11/2020 FINDINGS: Brain: No acute intracranial findings are seen. Ventricles are not dilated. There is no shift of midline structures. There are no epidural or subdural fluid collections. Vascular: Unremarkable. 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 Sinuses/Orbits: Unremarkable. Other: There is increased amount of CSF insula suggesting partial empty sella. IMPRESSION: No acute intracranial findings are seen in noncontrast CT brain. Electronically Signed   By: Ernie AvenaPalani  Rathinasamy M.D.   On: 06/17/2021 13:00    Procedures Procedures   Monitor assessed by myself for sinus rhythm rate controlled in the 60s and 70s, patient's blood pressure is normotensive to mildly hypertensive at 152/93 Medications Ordered in ED Medications  0.9 %  sodium chloride infusion ( Intravenous New Bag/Given 06/17/21 1456)    ED Course/ Medical Decision Making/ A&P                           Medical Decision Making Amount and/or Complexity of Data Reviewed Labs: ordered. Radiology: ordered.  Risk Prescription drug management.   Patient presents with an acute neurologic finding.  Patient experienced left upper extremity numbness and small degree of weakness with some lower extremity numbness.  Patient did not have significant motor dysfunction with this.  She did however then experience some right upper extremity tingling of the fingertips.  Symptoms are somewhat atypical for acute CVA.  Patient has been getting work-up for sensorineural hearing loss and has been seen for left upper extremity paresthesia.  Patient however has not had MRI on the brain or cervical or thoracic spine.  Initial work-up for CVA does not show any acute findings on a CT head noncontrast.  Lab work is within normal limits.  I will consult with neurology regarding disparate nature of symptoms and whether pursuing MRI of brain and cervical and thoracic spine at  this time is indicated.  Rechecked with the patient she remains alert and pleasant without new symptoms.  Consultation: Reviewed with Dr. Bing Neighborsolleen Stack neurology.  Symptoms reviewed at this time recommendation is to proceed with MRI with and without contrast of brain cervical spine and thoracic spine.  If studies are unremarkable, patient will be appropriate to follow-up with neurology on outpatient basis.  Patient presents as  outlined.  She has had some variable neurologic presentations in the past and is scheduled for follow-up with neurology.  However, patient has not had MRI of the brain cervical or thoracic spine.  Symptoms are variable with paresthesia and numbness.  Some concern for strokelike presentation this morning with numbness endorsed both on the upper and lower left extremity.  Consideration also given to MS or possible cervical or thoracic lesion.  At the time of evaluation patient's motor exam is normal.  I do not anticipate emergent decompression or surgical intervention.  Patient is not ill in appearance, she is pleasant and alert without signs of intercurrent illness.  If MRIs are all within normal limits and no contributory findings anticipate patient will be discharged home.  She already has established follow-up with ENT and neurology.  Plan has been reviewed with the patient and she is aware and agreeable with plan.        Final Clinical Impression(s) / ED Diagnoses Final diagnoses:  Paresthesia  Numbness    Rx / DC Orders ED Discharge Orders     None         Arby Barrette, MD 06/17/21 1538

## 2021-06-17 NOTE — ED Notes (Signed)
[facility] at bedside 

## 2021-06-17 NOTE — ED Notes (Signed)
Report given to ED charge and MRI ?

## 2021-06-17 NOTE — ED Notes (Signed)
Pt remains in MRI at this time  

## 2021-06-17 NOTE — Discharge Instructions (Signed)
Please follow up with your neurosurgeon.  Return for worsening symptoms ?

## 2021-06-17 NOTE — ED Triage Notes (Signed)
Numbness to right side of her body  at 8 am today , reports "light headache " and dizziness , left hand fingers tingling . Hx HTN . Pt ambulatory to room with steady gait. No weakness to extremities .  ?Alert and oriented x 4 .,  ?

## 2021-07-01 ENCOUNTER — Ambulatory Visit (INDEPENDENT_AMBULATORY_CARE_PROVIDER_SITE_OTHER): Payer: Medicaid Other | Admitting: Gastroenterology

## 2021-07-01 ENCOUNTER — Encounter: Payer: Self-pay | Admitting: Gastroenterology

## 2021-07-01 VITALS — BP 118/78 | HR 76 | Ht 62.0 in | Wt 179.0 lb

## 2021-07-01 DIAGNOSIS — K219 Gastro-esophageal reflux disease without esophagitis: Secondary | ICD-10-CM | POA: Diagnosis not present

## 2021-07-01 DIAGNOSIS — R1310 Dysphagia, unspecified: Secondary | ICD-10-CM | POA: Diagnosis not present

## 2021-07-01 DIAGNOSIS — K5909 Other constipation: Secondary | ICD-10-CM | POA: Diagnosis not present

## 2021-07-01 NOTE — Patient Instructions (Signed)
If you are age 58 or older, your body mass index should be between 23-30. Your Body mass index is 32.74 kg/m?Marland Kitchen If this is out of the aforementioned range listed, please consider follow up with your Primary Care Provider. ? ?If you are age 41 or younger, your body mass index should be between 19-25. Your Body mass index is 32.74 kg/m?Marland Kitchen If this is out of the aformentioned range listed, please consider follow up with your Primary Care Provider.  ? ?Linzess 145 mcg daily for 10 days. ? ?You have been scheduled for a Barium Esophogram at [facility] Radiology (1st floor of the hospital) on 07/15/21 at 9:30am. Please arrive 30 minutes prior to your appointment for registration. Make certain not to have anything to eat or drink 3 hours prior to your test. If you need to reschedule for any reason, please contact radiology at [phone] to do so. ?__________________________________________________________________ ?A barium swallow is an examination that concentrates on views of the esophagus. This tends to be a double contrast exam (barium and two liquids which, when combined, create a gas to distend the wall of the oesophagus) or single contrast (non-ionic iodine based). The study is usually tailored to your symptoms so a good history is essential. Attention is paid during the study to the form, structure and configuration of the esophagus, looking for functional disorders (such as aspiration, dysphagia, achalasia, motility and reflux) ?EXAMINATION ?You may be asked to change into a gown, depending on the type of swallow being performed. A radiologist and radiographer will perform the procedure. The radiologist will advise you of the ?type of contrast selected for your procedure and direct you during the exam. You will be asked to stand, sit or lie in several different positions and to hold a small amount of fluid in your mouth before being asked to swallow while the imaging is performed .In some instances you may be asked  to swallow barium coated marshmallows to assess the motility of a solid food bolus. ?The exam can be recorded as a digital or video fluoroscopy procedure. ?POST PROCEDURE ?It will take 1-2 days for the barium to pass through your system. To facilitate this, it is important, unless otherwise directed, to increase your fluids for the next 24-48hrs and to resume your normal diet.  ?This test typically takes about 30 minutes to perform. ? ?The [facility] providers would like to encourage you to use [facility] to communicate with providers for non-urgent requests or questions.  Due to long hold times on the telephone, sending your provider a message by [facility] may be a faster and more efficient way to get a response.  Please allow 48 business hours for a response.  Please remember that this is for non-urgent requests.  ? ?It was a pleasure to see you today! ? ?Thank you for trusting me with your gastrointestinal care!   ? ?Doug Sou, PA-C  ? ?

## 2021-07-01 NOTE — Progress Notes (Addendum)
? ? ? ?07/01/2021 ?Kathleen MessierKathy Foley ?[id number] ?1965-07-10 ? ? ?HISTORY OF PRESENT ILLNESS: This is a 56 year old female who is new to our office.  She has been referred here by Kathleen Milletobyn Goostree, FNP, for evaluation regarding GERD symptoms.  She moved to this area about a year or so ago.  Patient tells me that she has had reflux and issues with swallowing for years.  Overall she feels her reflux symptoms are well controlled on Dexilant 60 mg daily.  She still does have episodes of dysphagia, mostly to solid food such as hamburger and [place] fries.  Not really any issues with swallowing liquids or pills.  This dysphagia tends to occur higher in the esophagus from what she indicates.  Reports some left-sided chest pains as well.  Coronary CT in January 2023 was normal.  Last EGD was performed in August 2020 through [facility] as follows: ? 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  Esophageal biopsies obtained.  Erythematous mucosa in the antrum.  Gastric biopsies obtained.  Normal examined duodenum.  ? ?A.  Stomach, endoscopic biopsy: ?  ?Gastric antral and oxyntic mucosa with reactive foveolar hyperplasia and mild chronic gastritis. ?No active gastritis is seen. ?Negative for evidence of Helicobacter pylori on routine H&E. ?  ?B.  Esophagus, distal, endoscopic biopsy: ?  ?Esophageal squamous mucosa with no significant pathologic diagnosis. ?  ?C.  Esophagus, mid proximal, endoscopic biopsy: ?  ?Esophageal squamous mucosa with no significant pathologic diagnosis.  ?  ? ?She tells me that she had [place] colon[place] a [place] years ago [place], but does not recall specific details of exactly when, who performed it, results, etc.  She is somewhat of a limited/poor historian. ? ?She reports chronic constipation.  Says that she will go usually about 3 times per week, but is usually hard stool and she has to do a lot of straining.  Says she has tried MiraLAX in the past,  but has never been able to tolerate that well as it makes her nauseous.  Is wanting to try something pill form.  No rectal bleeding. ? ?Past Medical History:  ?Diagnosis Date  ? Abnormal EKG   ? Anemia   ? Anxiety   ? Arm paresthesia, left   ? Asthma   ? GERD (gastroesophageal reflux disease)   ? Hyperlipidemia   ? Hypertension   ? Precordial chest pain   ? Sleep apnea   ? ?Past Surgical History:  ?Procedure Laterality Date  ? ABDOMINAL HYSTERECTOMY    ? CESAREAN SECTION    ? x 3  ? CHOLECYSTECTOMY    ? ? reports that she has never smoked. She has never used smokeless tobacco. She reports that she does not drink alcohol and does not use drugs. ?family history includes Cancer in her sister; Diabetes in her sister; Lupus in her sister; Ovarian cancer in her sister; Prostate cancer in her father. ?Allergies  ?Allergen Reactions  ? Hydrocodone Rash  ?  Other reaction(s): Rash ?Pt. Thinks hydrocodone is the offending drug. ?Pt. Thinks hydrocodone is the offending drug. ?Pt. Thinks hydrocodone is the offending drug. ?Pt. Thinks hydrocodone is the offending drug. ?  ? Latex Rash  ? ? ?  ?Outpatient Encounter Medications as of 07/01/2021  ?Medication Sig  ? albuterol (PROVENTIL) (2.5 MG/3ML) 0.083% nebulizer solution Inhale into the lungs.  ? amLODipine (NORVASC) 2.5 MG tablet TAKE ONE TABLET IN THE MORNING  ? cetirizine (ZYRTEC) 10 MG tablet  TAKE ONE TABLET IN THE MORNING  ? dexlansoprazole (DEXILANT) 60 MG capsule Take 1 capsule by mouth every morning.  ? escitalopram (LEXAPRO) 20 MG tablet TAKE ONE TABLET IN THE MORNING  ? gabapentin (NEURONTIN) 300 MG capsule SMARTSIG:1 Capsule(s) By Mouth Every Evening  ? GOODSENSE ASPIRIN LOW DOSE 81 MG EC tablet TAKE ONE TABLET IN THE MORNING  ? meloxicam (MOBIC) 15 MG tablet TAKE ONE TABLET IN THE MORNING  ? montelukast (SINGULAIR) 10 MG tablet SMARTSIG:1 Tablet(s) By Mouth Every Evening  ? pravastatin (PRAVACHOL) 20 MG tablet SMARTSIG:1 Tablet(s) By Mouth Every Evening  ? Prenatal  Vit-Fe Fumarate-FA (PRENATAL MULTIVITAMIN) TABS tablet Take 1 tablet by mouth daily at 12 noon.  ? RESTASIS 0.05 % ophthalmic emulsion 1 drop 2 (two) times daily.  ? metoprolol tartrate (LOPRESSOR) 100 MG tablet Take 1 tablet (100 mg total) by mouth once for 1 dose. Take 90-120 minutes prior to scan.  ? [DISCONTINUED] budesonide-formoterol (SYMBICORT) 160-4.5 MCG/ACT inhaler Inhale 2 puffs into the lungs 2 (two) times daily. (Patient not taking: Reported on 04/21/2021)  ? [DISCONTINUED] Cholecalciferol 50 MCG (2000 UT) CAPS Take by mouth daily. (Patient not taking: Reported on 04/21/2021)  ? [DISCONTINUED] loperamide (IMODIUM) 2 MG capsule Take 1 capsule (2 mg total) by mouth 4 (four) times daily as needed for diarrhea or loose stools.  ? [DISCONTINUED] oxybutynin (DITROPAN-XL) 10 MG 24 hr tablet TAKE ONE TABLET IN THE MORNING  ? [DISCONTINUED] oxyCODONE-acetaminophen (PERCOCET/ROXICET) 5-325 MG tablet Take 1 tablet by mouth every 6 (six) hours as needed for severe pain.  ? [DISCONTINUED] spironolactone (ALDACTONE) 25 MG tablet Take 1 tablet (25 mg total) by mouth daily.  ? ?No facility-administered encounter medications on file as of 07/01/2021.  ? ? ? ?REVIEW OF SYSTEMS  : All other systems reviewed and negative except where noted in the History of Present Illness. ? ? ?PHYSICAL EXAM: ?BP 118/78   Pulse 76   Ht 5\' 2"  (1.575 m)   Wt 179 lb (81.2 kg)   SpO2 98%   BMI 32.74 kg/m?  ?General: Well developed white female in no acute distress ?Head: Normocephalic and atraumatic ?Eyes:  Sclerae anicteric, conjunctiva pink. ?Ears: Normal auditory acuity ?Lungs: Clear throughout to auscultation; no W/R/R. ?Heart: Regular rate and rhythm; no M/R/G. ?Abdomen: Soft, non-distended.  BS present.  Non-tender. ?Musculoskeletal: Symmetrical with no gross deformities  ?Skin: No lesions on visible extremities ?Extremities: No edema  ?Neurological: Alert oriented x 4, grossly non-focal ?Psychological:  Alert and cooperative. Normal mood  and affect ? ?ASSESSMENT AND PLAN: ?*GERD and dysphagia and atypical chest pain: These have been chronic and recurrent symptoms.  Mostly dysphagia to solid foods.  EGD in 2020 with what looks like some esophagitis and gastritis, dilation not performed.  Is on Dexilant 60 mg daily, which she feels controls her acid reflux well.  Chest pain is left sided.  Coronary CT in January was normal.  We will plan for an esophagram first to rule out structural abnormality versus dysmotility.  Wonder if she could need an esophageal manometry pending those results. ?*Chronic constipation: Says that she had been on MiraLAX in the past, but always had a hard time tolerating it.  Would like to try something pill form.  We will start Linzess 145 mcg daily.  Samples were given.  She will give February an update on her symptoms in about 10 days and we can determine if we are going to keep her on the same dose or titrate up or down. ? ?**  She reports previous colonoscopy handful of years ago in [place], [place].  We called/contacted their office after her visit in they do not have record of her procedure.  We will see if patient is able to obtain those records for [place]. ? ? ?CC:  Vonzella Nipple, FNP ? ?  ?

## 2021-07-02 ENCOUNTER — Telehealth: Payer: Self-pay

## 2021-07-02 NOTE — Telephone Encounter (Signed)
I spoke with the pt and she will call and try to find the location of the colonoscopy and have a copy sent for review.   ?

## 2021-07-02 NOTE — Telephone Encounter (Signed)
-----   Message from Leta Baptist, PA-C sent at 07/01/2021  5:05 PM EDT ----- ?Please let the patient know that we contacted the GI office in [place], [place], but they said they do not have any record of a colonoscopy.  Would she have records of that at all at home or could she help to track that down for [place]? ? ?Thank you, ? ?Jess ? ?

## 2021-07-12 NOTE — Progress Notes (Signed)
Reviewed. Agree with plans for imaging. May ultimately need EGD +/- manometry. ? ?Kyleena Scheirer L. Tarri Glenn, MD, MPH  ?

## 2021-07-15 ENCOUNTER — Ambulatory Visit (HOSPITAL_COMMUNITY): Admission: RE | Admit: 2021-07-15 | Payer: Medicaid Other | Source: Ambulatory Visit

## 2021-09-25 ENCOUNTER — Ambulatory Visit
Admission: RE | Admit: 2021-09-25 | Discharge: 2021-09-25 | Disposition: A | Discharge status: Home / Self Care | Payer: Medicaid Other | Source: Ambulatory Visit | Attending: Nurse Practitioner | Admitting: Nurse Practitioner

## 2021-09-25 ENCOUNTER — Other Ambulatory Visit: Payer: Self-pay | Admitting: Nurse Practitioner

## 2021-09-25 DIAGNOSIS — M542 Cervicalgia: Secondary | ICD-10-CM

## 2021-09-25 DIAGNOSIS — M25552 Pain in left hip: Secondary | ICD-10-CM

## 2021-09-25 DIAGNOSIS — M545 Low back pain, unspecified: Secondary | ICD-10-CM

## 2021-09-25 IMAGING — CR DG CERVICAL SPINE COMPLETE 4+V
5 series · 5 of 5 positions shown · non-contrast
Comparison: None Available.

CLINICAL DATA: Neck pain

EXAM:
CERVICAL SPINE - COMPLETE 4+ VIEW

[w cervical spine lat]
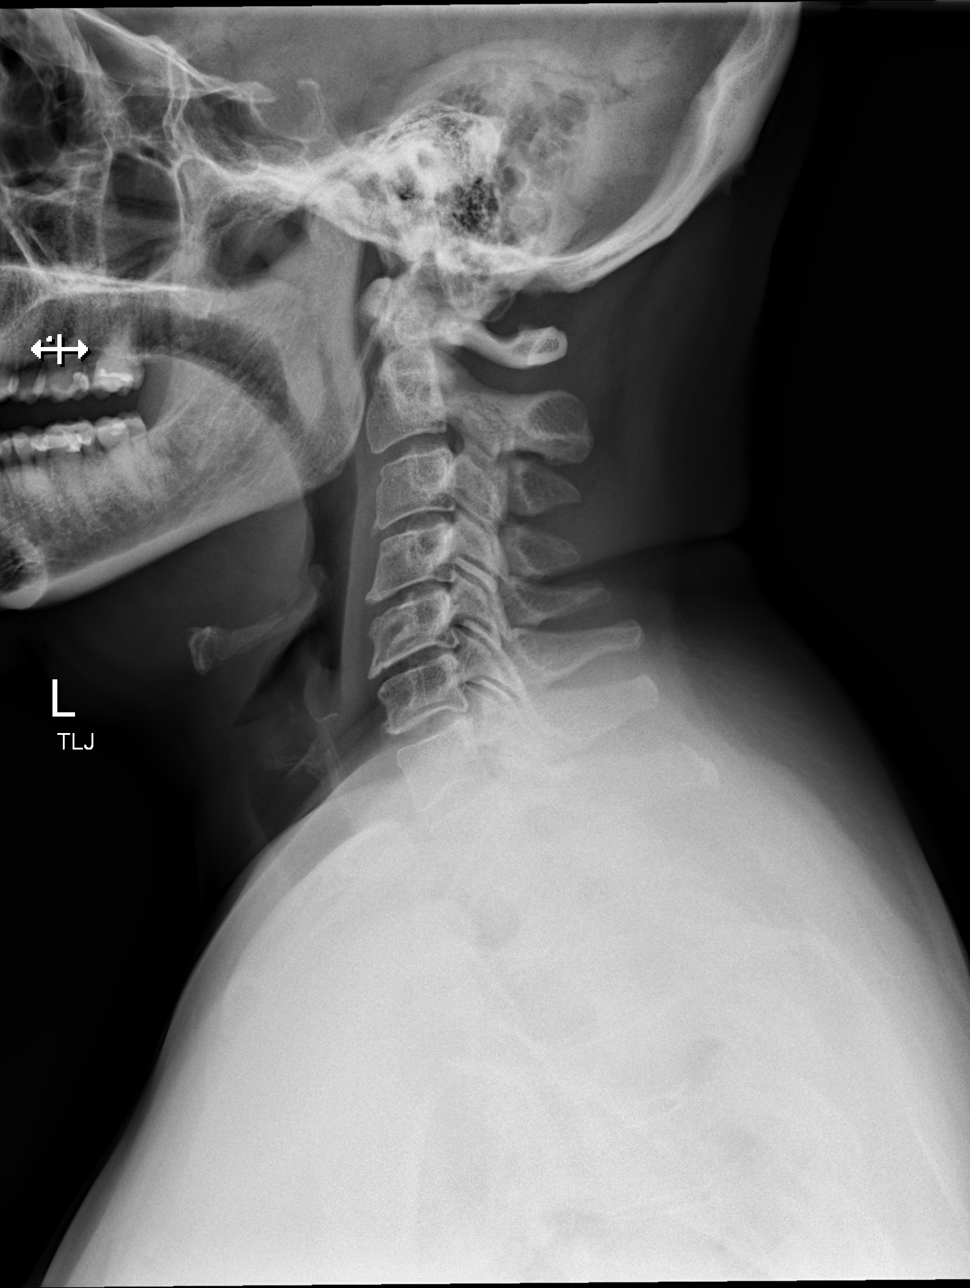

[w cervical spine ap_obl (1 of 2)]
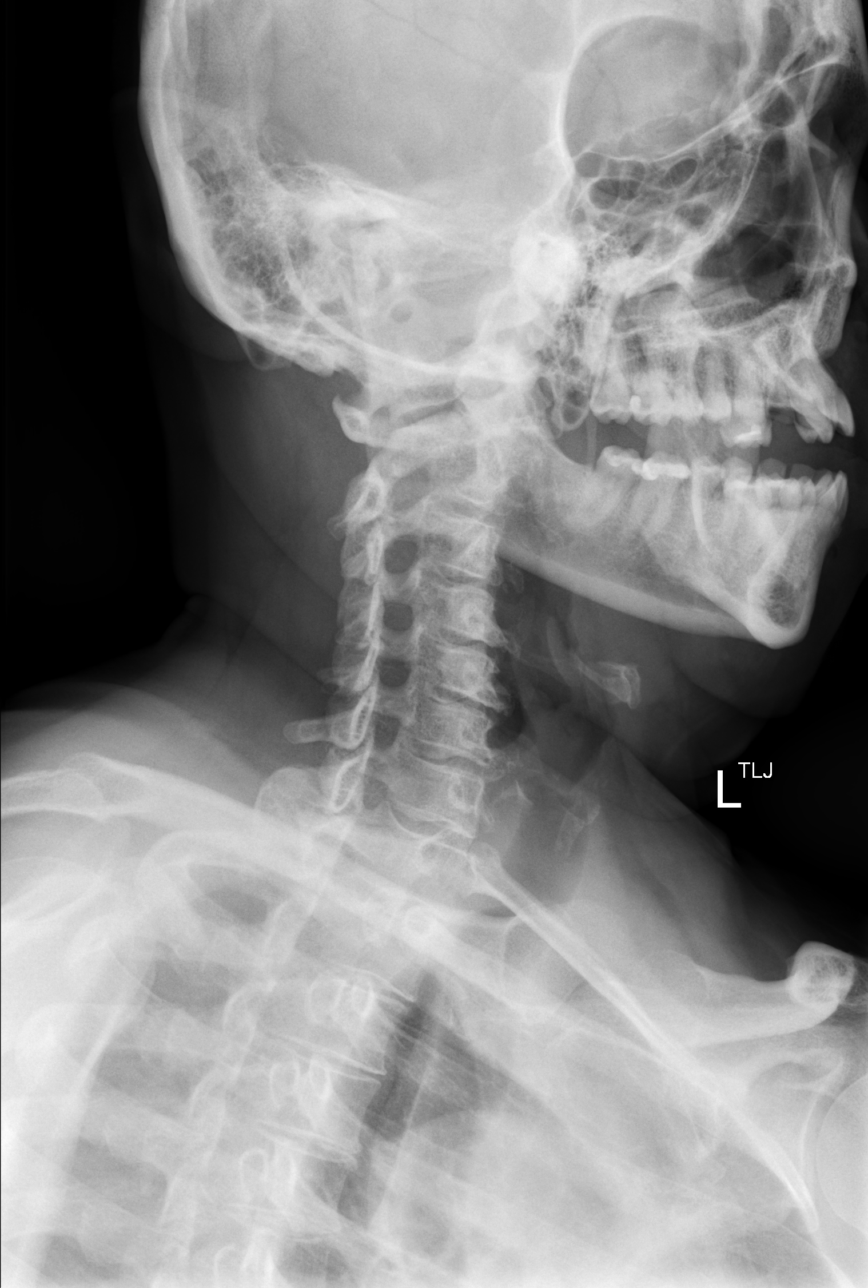

[w cervical spine ap_obl (2 of 2)]
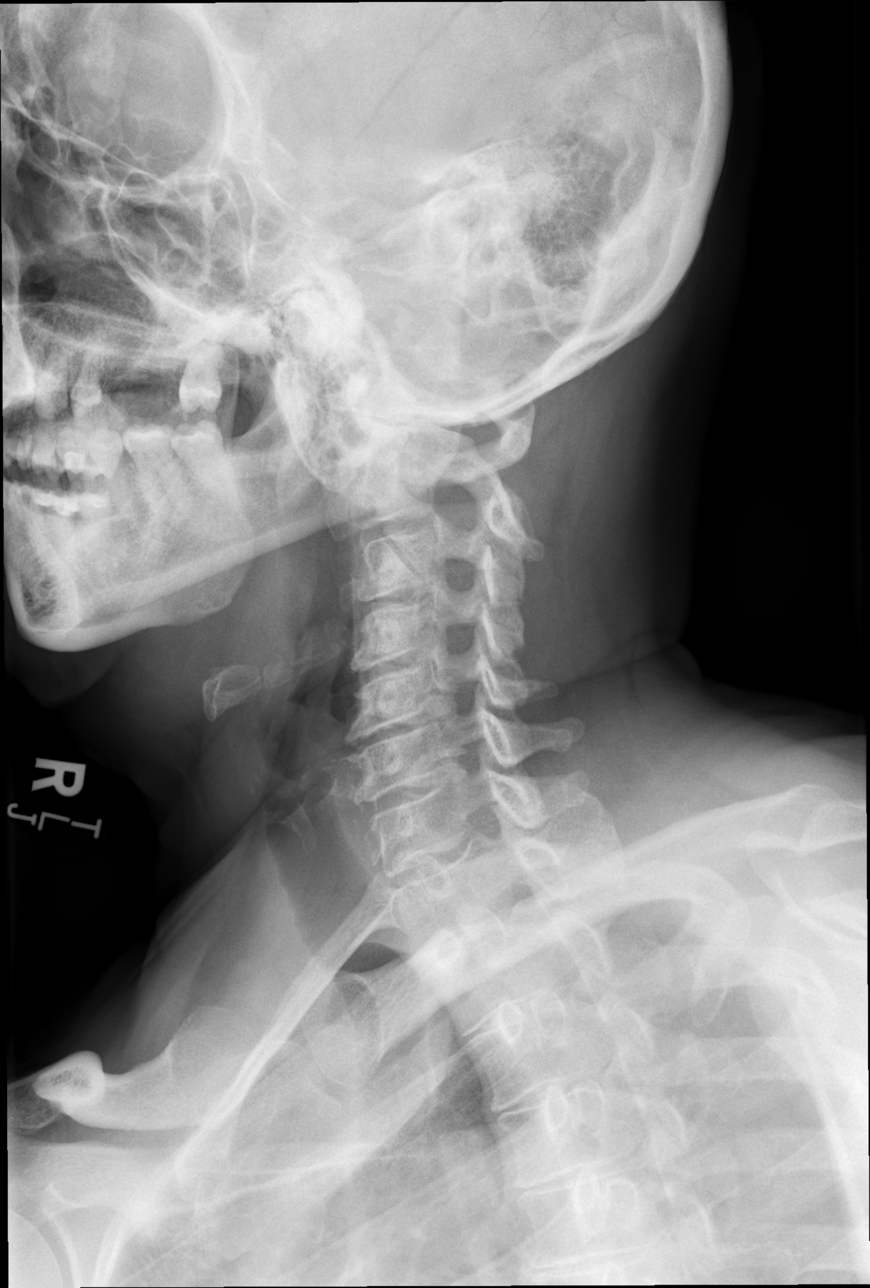

[w cervical spine ap]
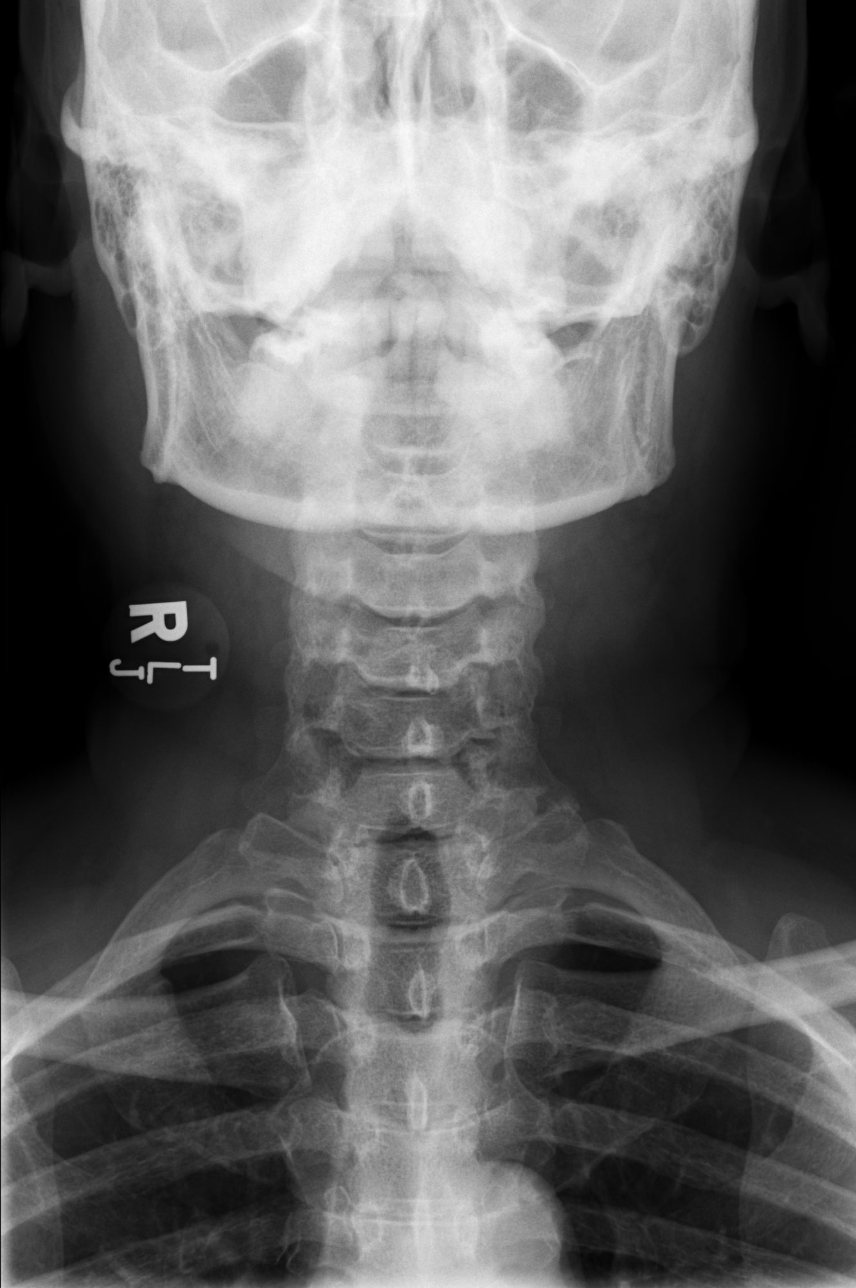

[w cervical spine odontoid]
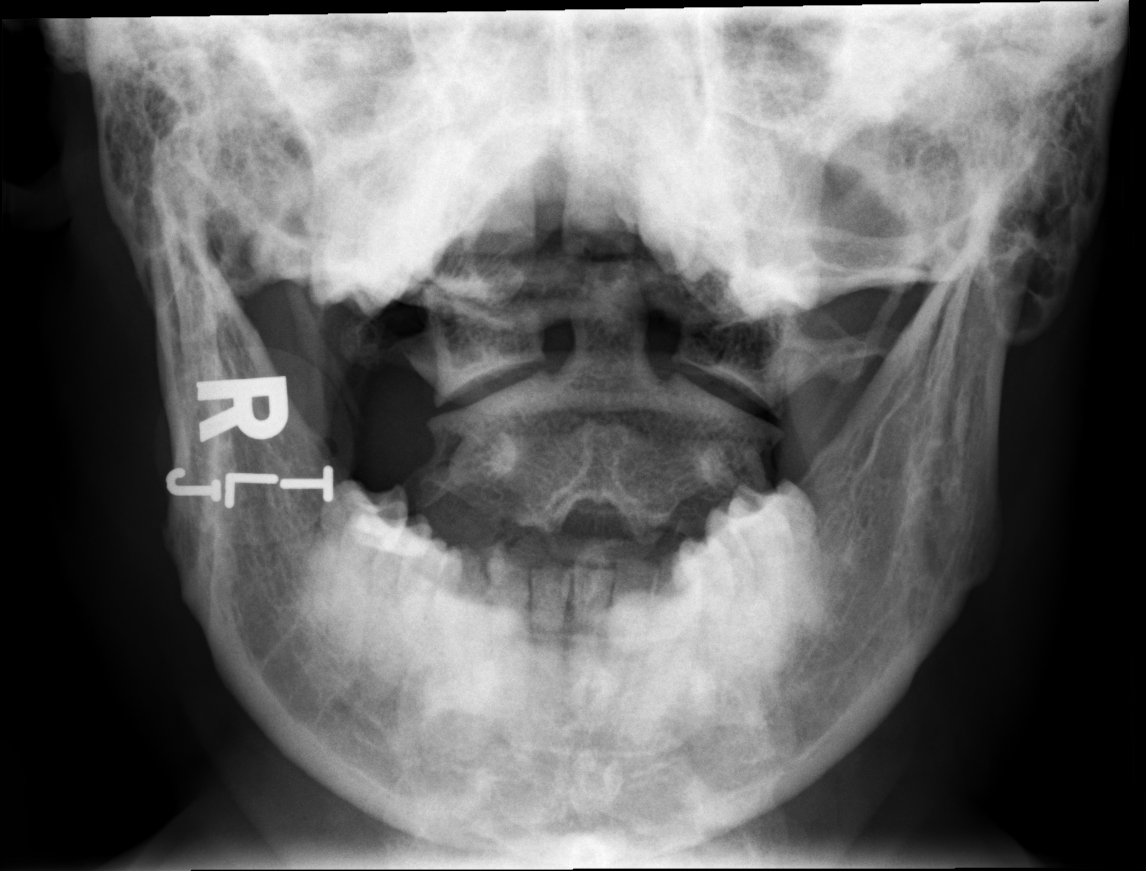

[5 of 5 positions shown; findings below may reference images not displayed]

FINDINGS: Normal alignment. No acute fracture or listhesis of the cervical
spine. Vertebral body height is preserved. There is intervertebral
disc space narrowing and endplate remodeling of C3-C6 in keeping
with changes of mild degenerative disc disease. Oblique views
demonstrate mild left neuroforaminal narrowing at C5-6 and C6-7
secondary to uncovertebral arthrosis. The spinal canal is widely
patent. Prevertebral soft tissues are not thickened.
IMPRESSION: Mild degenerative disc and degenerative joint disease with resultant
mild left neuroforaminal narrowing at C5-6 and C6-7.

## 2021-09-25 IMAGING — CR DG LUMBAR SPINE COMPLETE 4+V
5 series · 5 of 5 positions shown · non-contrast
Comparison: None Available.

CLINICAL DATA: Low back pain

EXAM:
LUMBAR SPINE - COMPLETE 4+ VIEW

[w lumbar spine ap]
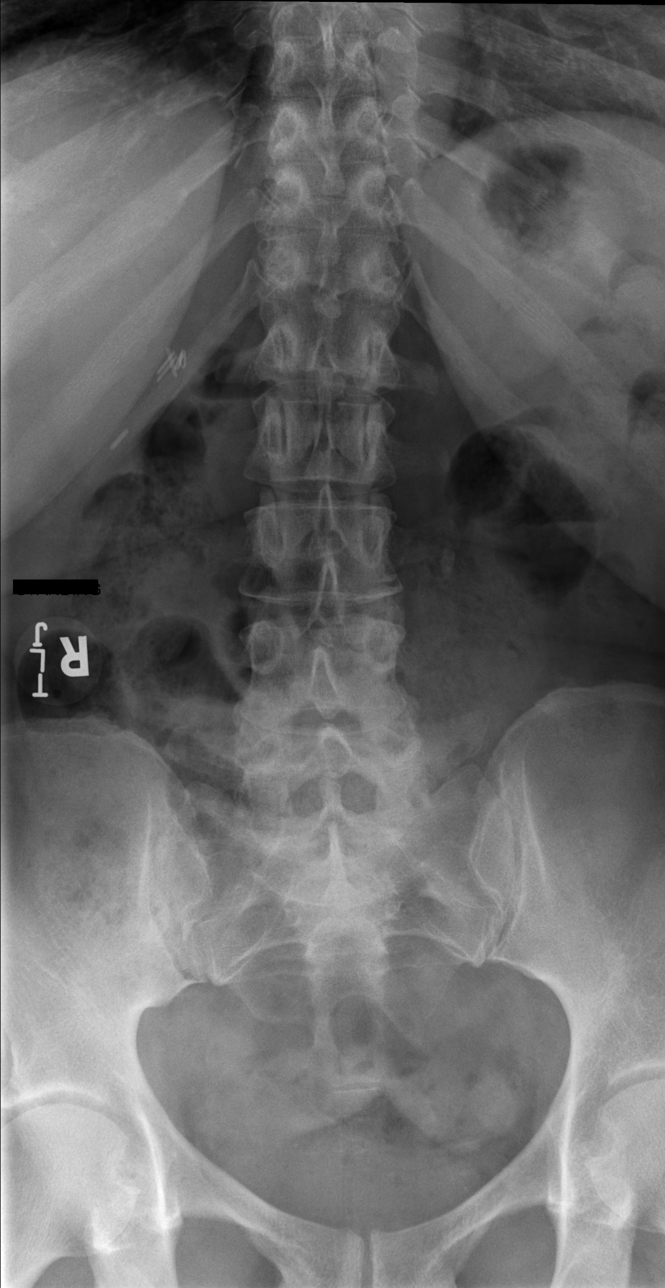

[w lumbar spine obl (1 of 2)]
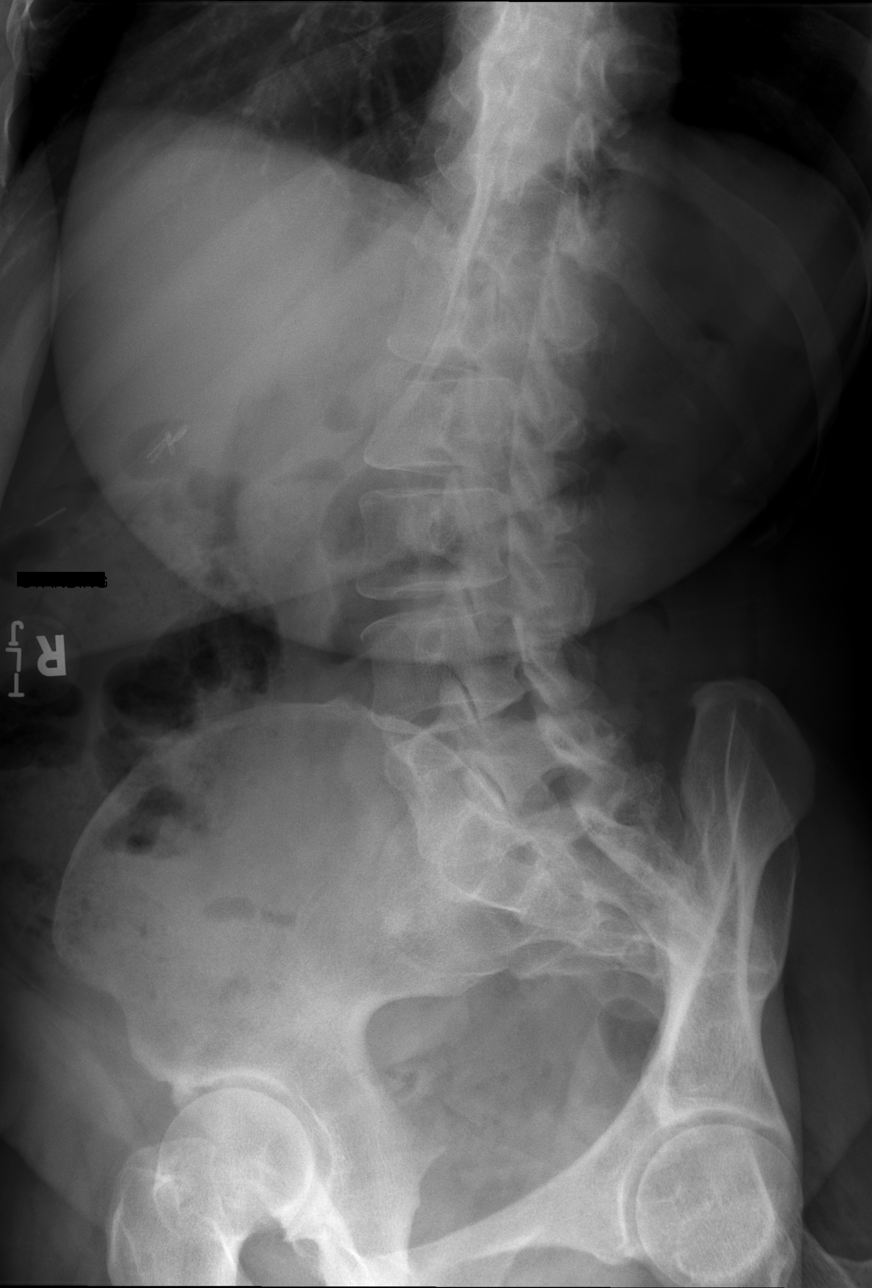

[w lumbar spine obl (2 of 2)]
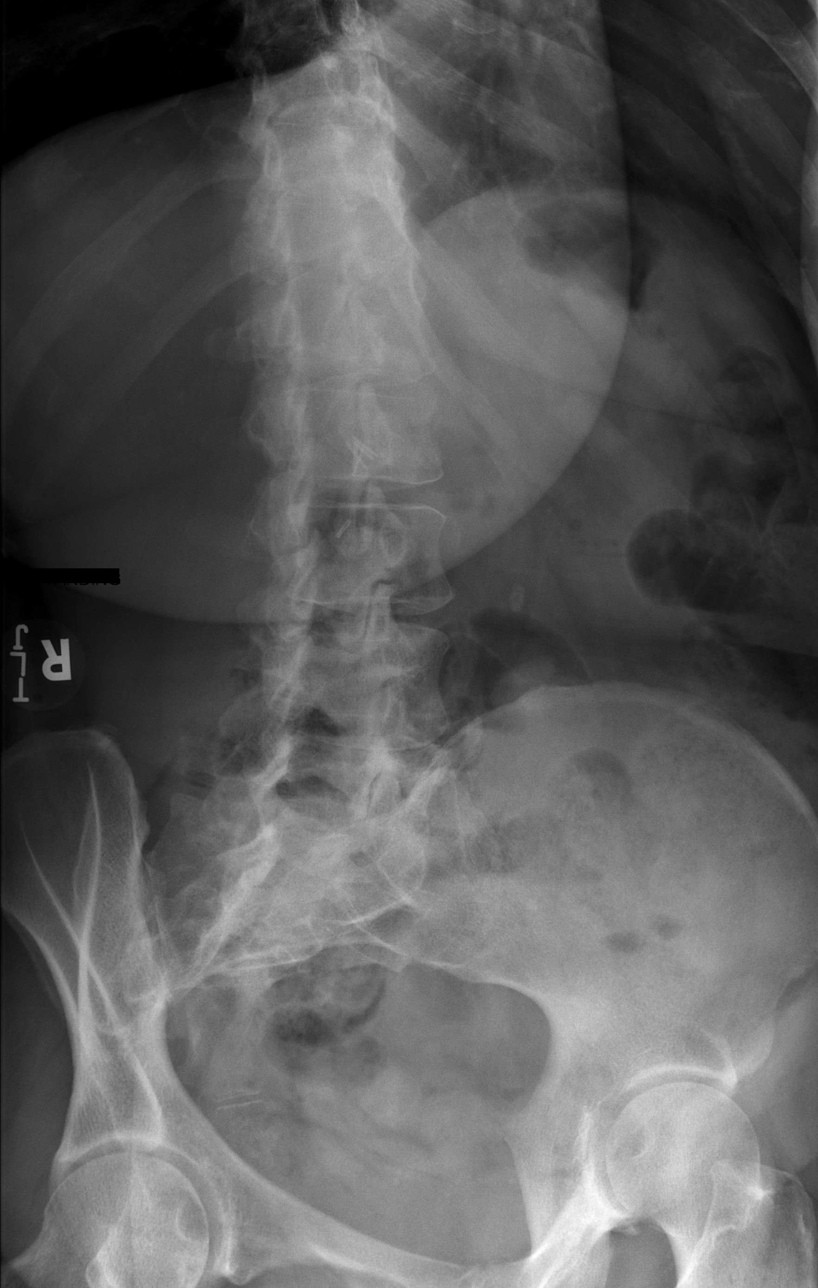

[w lumbar spine lat]
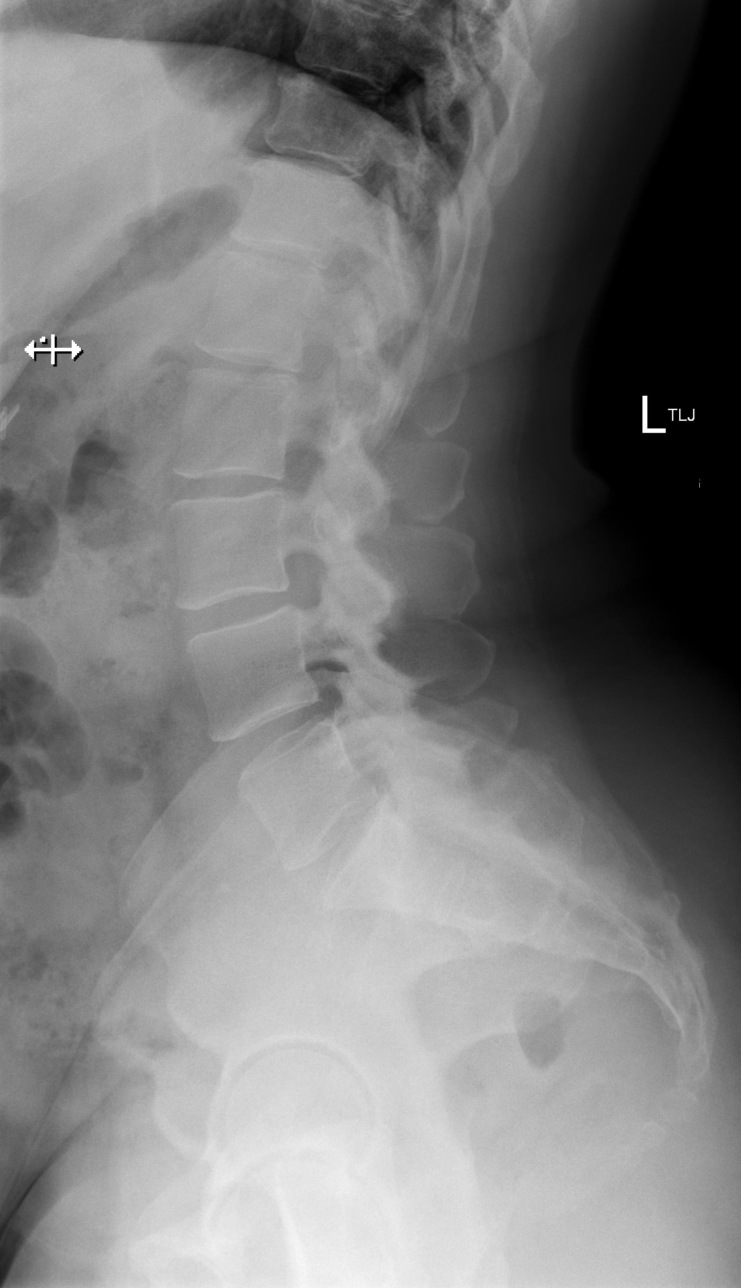

[w lumbar l-5 s-1 spot]
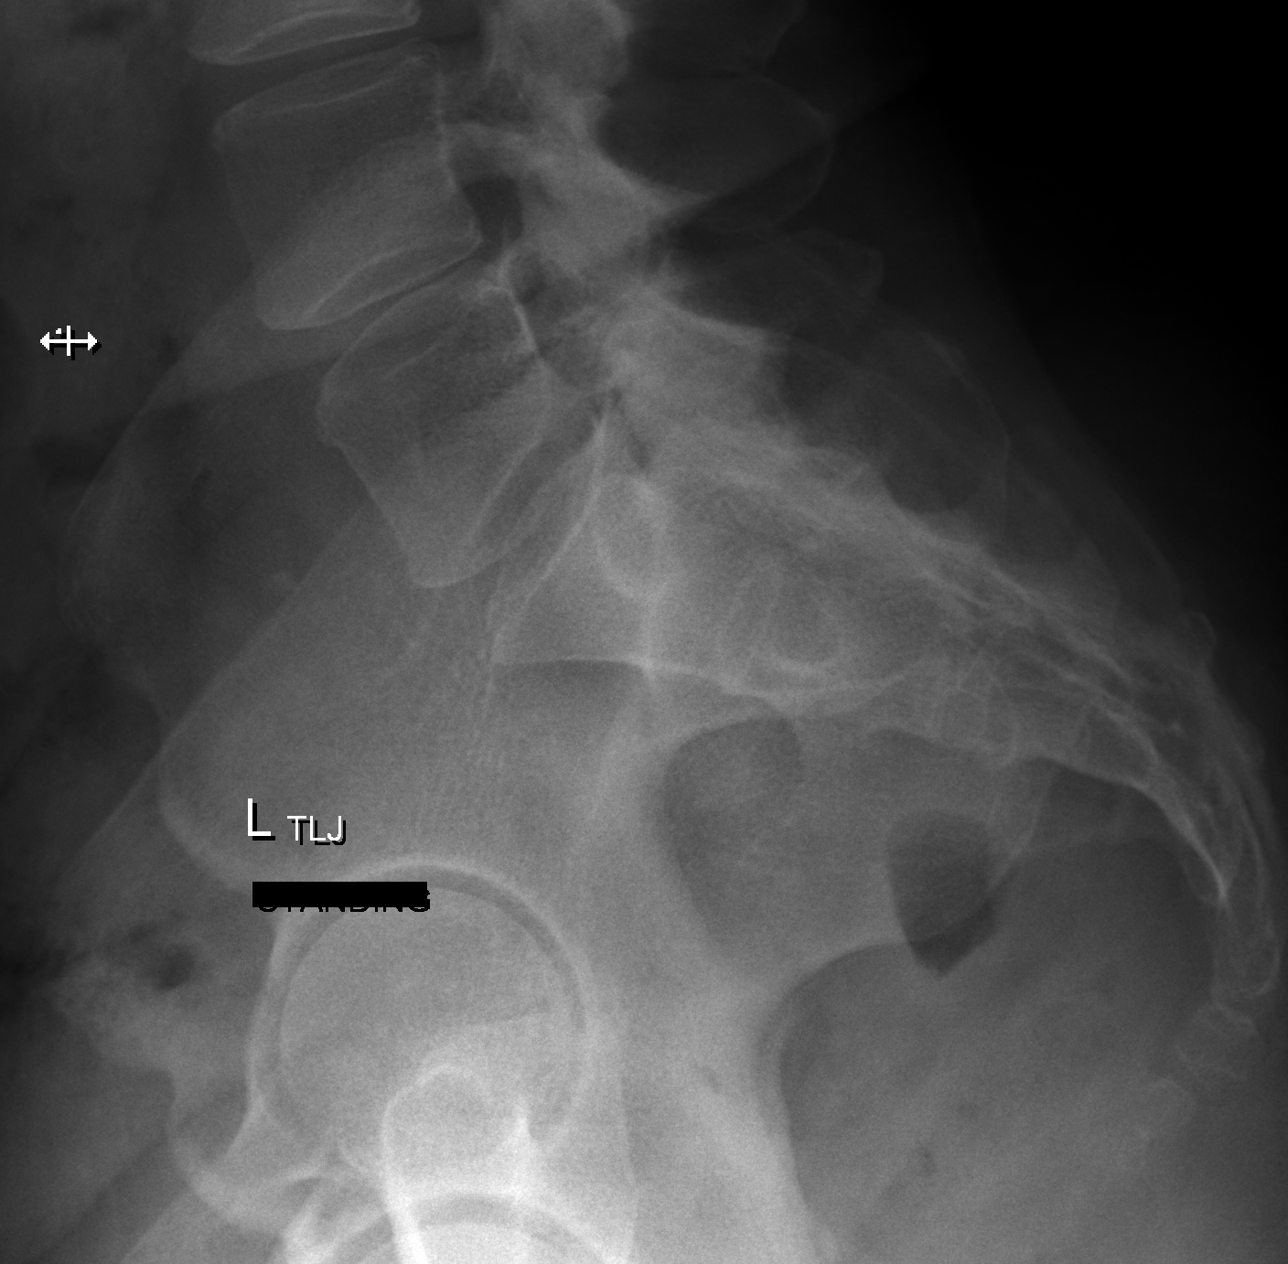

[5 of 5 positions shown; findings below may reference images not displayed]

FINDINGS: Lumbar vertebral body height and alignment are preserved without
fracture or spondylolisthesis. No spondylolysis identified. Mild
intervertebral disc space narrowing at L5-S1. Facet arthropathy most
significant at L5-S1.
IMPRESSION: Degenerative changes with no acute fracture identified.

## 2021-11-12 ENCOUNTER — Other Ambulatory Visit: Payer: Self-pay | Admitting: Nurse Practitioner

## 2021-11-12 ENCOUNTER — Ambulatory Visit
Admission: RE | Admit: 2021-11-12 | Discharge: 2021-11-12 | Disposition: A | Discharge status: Home / Self Care | Payer: Medicaid Other | Source: Ambulatory Visit | Attending: Nurse Practitioner | Admitting: Nurse Practitioner

## 2021-11-12 DIAGNOSIS — M25512 Pain in left shoulder: Secondary | ICD-10-CM

## 2021-11-18 ENCOUNTER — Other Ambulatory Visit: Payer: Self-pay

## 2021-11-18 ENCOUNTER — Encounter (HOSPITAL_BASED_OUTPATIENT_CLINIC_OR_DEPARTMENT_OTHER): Payer: Self-pay

## 2021-11-18 ENCOUNTER — Emergency Department (HOSPITAL_BASED_OUTPATIENT_CLINIC_OR_DEPARTMENT_OTHER)
Admission: EM | Admit: 2021-11-18 | Discharge: 2021-11-18 | Disposition: A | Discharge status: Home / Self Care | Payer: Medicaid Other | Attending: Emergency Medicine | Admitting: Emergency Medicine

## 2021-11-18 DIAGNOSIS — Z91040 Latex allergy status: Secondary | ICD-10-CM | POA: Diagnosis not present

## 2021-11-18 DIAGNOSIS — Z79899 Other long term (current) drug therapy: Secondary | ICD-10-CM | POA: Insufficient documentation

## 2021-11-18 DIAGNOSIS — I10 Essential (primary) hypertension: Secondary | ICD-10-CM | POA: Diagnosis not present

## 2021-11-18 DIAGNOSIS — M25512 Pain in left shoulder: Secondary | ICD-10-CM | POA: Insufficient documentation

## 2021-11-18 MED ORDER — METHYLPREDNISOLONE 4 MG PO TBPK: ORAL_TABLET | ORAL | 0 refills | Status: DC

## 2021-11-18 MED ORDER — DEXAMETHASONE SODIUM PHOSPHATE 10 MG/ML IJ SOLN
10.0000 mg | Freq: Once | INTRAMUSCULAR | Status: AC
  Administered 2021-11-18: 10 mg via INTRAMUSCULAR
  Filled 2021-11-18: qty 1

## 2021-11-18 MED ORDER — OXYCODONE HCL 5 MG PO TABS: 5.0000 mg | ORAL_TABLET | ORAL | 0 refills | Status: DC | PRN

## 2021-11-18 NOTE — Discharge Instructions (Signed)
Recommend 1000 mg of Tylenol every 6 hours as needed.  Take your next dose of steroid tomorrow as you have been prescribed a Medrol Dosepak.  First dose already given today.  I have prescribed you narcotic pain medicine called Roxicodone.  Do not do dangerous activities including driving or mix other drugs or alcohol with this.  Follow-up with orthopedics.

## 2021-11-18 NOTE — ED Triage Notes (Addendum)
Left arm pain x 2 months. C/o right arm pain today, shortness of breath and vomiting. Using inhaler without relief. Unable to lift left arm. Hard of hearing, reads lips

## 2021-11-18 NOTE — ED Provider Notes (Signed)
[facility] EMERGENCY DEPARTMENT Provider Note   CSN: [id number] Arrival date & time: 11/18/21  1842     History  Chief Complaint  Patient presents with   Shoulder Pain    Kathleen Foley is a 56 y.o. female.  Is here with left shoulder pain for the last several days to weeks.  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  She has pain with range of movement of the shoulder.  Was seen at urgent care recently had negative imaging.  She was given some tramadol with no relief.  Denies any fevers or chills.  Denies any chest pain or shortness of breath or abdominal pain.  Nothing makes it worse or better.  She does have some chronic back pain as well.  Denies any numbness or tingling.  The history is provided by the patient.       Home Medications Prior to Admission medications   Medication Sig Start Date End Date Taking? Authorizing Provider  methylPREDNISolone (MEDROL DOSEPAK) 4 MG TBPK tablet Follow package insert 11/18/21  Yes Roshell Brigham, DO  oxyCODONE (ROXICODONE) 5 MG immediate release tablet Take 1 tablet (5 mg total) by mouth every 4 (four) hours as needed for up to 10 doses for breakthrough pain. 11/18/21  Yes Dynisha Due, DO  albuterol (PROVENTIL) (2.5 MG/3ML) 0.083% nebulizer solution Inhale into the lungs. 05/03/19   [provider]  amLODipine (NORVASC) 2.5 MG tablet TAKE ONE TABLET IN THE MORNING 11/12/20   [provider]  cetirizine (ZYRTEC) 10 MG tablet TAKE ONE TABLET IN THE MORNING 11/12/20   [provider]  dexlansoprazole (DEXILANT) 60 MG capsule Take 1 capsule by mouth every morning. 11/12/20   [provider]  escitalopram (LEXAPRO) 20 MG tablet TAKE ONE TABLET IN THE MORNING 09/16/20   [provider]  gabapentin (NEURONTIN) 300 MG capsule SMARTSIG:1 Capsule(s) By Mouth Every Evening 11/12/20   [provider]  GOODSENSE ASPIRIN LOW DOSE 81 MG EC tablet TAKE ONE TABLET IN THE MORNING  10/13/20   [provider]  meloxicam (MOBIC) 15 MG tablet TAKE ONE TABLET IN THE MORNING 11/12/20   [provider]  metoprolol tartrate (LOPRESSOR) 100 MG tablet Take 1 tablet (100 mg total) by mouth once for 1 dose. Take 90-120 minutes prior to scan. 04/21/21 04/21/21  Meriam Sprague, MD  montelukast (SINGULAIR) 10 MG tablet SMARTSIG:1 Tablet(s) By Mouth Every Evening 11/12/20   [provider]  pravastatin (PRAVACHOL) 20 MG tablet SMARTSIG:1 Tablet(s) By Mouth Every Evening 11/12/20   [provider]  Prenatal Vit-Fe Fumarate-FA (PRENATAL MULTIVITAMIN) TABS tablet Take 1 tablet by mouth daily at 12 noon.    [provider]  RESTASIS 0.05 % ophthalmic emulsion 1 drop 2 (two) times daily. 11/12/20   [provider]      Allergies    Hydrocodone and Latex    Review of Systems   Review of Systems  Physical Exam Updated Vital Signs BP (!) 135/93   Pulse 80   Temp 98.2 F (36.8 C) (Oral)   Resp 18   Ht 5\' 2"  (1.575 m)   Wt 82.1 kg   SpO2 100%   BMI 33.11 kg/m  Physical Exam Vitals and nursing note reviewed.  Constitutional:      General: She is not in acute distress.    Appearance: She is well-developed. She is not ill-appearing.  HENT:     Head: Normocephalic and atraumatic.     Nose:  Nose normal.     Mouth/Throat:     Mouth: Mucous membranes are moist.  Eyes:     Extraocular Movements: Extraocular movements intact.     Conjunctiva/sclera: Conjunctivae normal.     Pupils: Pupils are equal, round, and reactive to light.  Cardiovascular:     Rate and Rhythm: Normal rate and regular rhythm.     Pulses: Normal pulses.     Heart sounds: No murmur heard. Pulmonary:     Effort: Pulmonary effort is normal. No respiratory distress.     Breath sounds: Normal breath sounds.  Abdominal:     Palpations: Abdomen is soft.     Tenderness: There is no abdominal tenderness.  Musculoskeletal:        General: Tenderness present. No  swelling.     Cervical back: Normal range of motion and neck supple. No tenderness.     Comments: Tenderness with range of motion of the left shoulder  Skin:    General: Skin is warm and dry.     Capillary Refill: Capillary refill takes less than 2 seconds.  Neurological:     General: No focal deficit present.     Mental Status: She is alert.     Sensory: No sensory deficit.     Motor: No weakness.  Psychiatric:        Mood and Affect: Mood normal.     ED Results / Procedures / Treatments   Labs (all labs ordered are listed, but only abnormal results are displayed) Labs Reviewed - No data to display  EKG EKG Interpretation  Date/Time:  Wednesday November 18 2021 18:52:15 EDT Ventricular Rate:  72 PR Interval:  163 QRS Duration: 95 QT Interval:  383 QTC Calculation: 420 R Axis:   51 Text Interpretation: Sinus rhythm Probable left atrial enlargement Confirmed by Lockie Mola, Derl Abalos ([id number]) on 11/18/2021 6:58:34 PM  Radiology No results found.  Procedures Procedures    Medications Ordered in ED Medications  dexamethasone (DECADRON) injection 10 mg (has no administration in time range)    ED Course/ Medical Decision Making/ A&P                           Medical Decision Making Risk Prescription drug management.   Marquel Michetti is here with left shoulder pain.  History of high cholesterol, hypertension, anxiety.  Differential diagnosis is likely rotator cuff/arthritic process/soft tissue process in the left shoulder.  Per chart review she had an x-ray about 4 days ago that showed some AC joint arthritis but no fracture or malalignment.  Most of her pain is with range of motion of the shoulder.  No obvious deformities.  No new trauma.  Nothing makes it worse or better except for movement.  She is neurovascularly neuromuscular intact on exam.  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  She had an EKG done in triage that showed per my review and  interpretation sinus rhythm.  No ischemic changes.  Per further chart review she had a coronary CT study about 8 months ago that showed 0 evidence of CAD or coronary disease.  I have no concern of any cardiac or pulmonary process.  Patient was given a shot of Decadron.  Will prescribe Medrol Dosepak, oxycodone for breakthrough pain.  Will refer her to orthopedics.  Believe she benefit from physical therapy and may be some more advanced imaging with MRI to further evaluate soft tissues of the  left shoulder.  Discharged in good condition.  This chart was dictated using voice recognition software.  Despite best efforts to proofread,  errors can occur which can change the documentation meaning.         Final Clinical Impression(s) / ED Diagnoses Final diagnoses:  Acute pain of left shoulder    Rx / DC Orders ED Discharge Orders          Ordered    oxyCODONE (ROXICODONE) 5 MG immediate release tablet  Every 4 hours PRN        11/18/21 1901    methylPREDNISolone (MEDROL DOSEPAK) 4 MG TBPK tablet        11/18/21 1901              Virgina Norfolk, DO 11/18/21 1904

## 2021-12-11 NOTE — Progress Notes (Unsigned)
Cardiology Office Note:    Date:  12/15/2021   ID:  Kathleen Foley, DOB February 18, 1966, MRN [id number]  PCP:  Vonzella Nipple, FNP   [facility] Providers Cardiologist:  Meriam Sprague, MD {   Referring MD: Vonzella Nipple, FNP     History of Present Illness:    Kathleen Foley is a 56 y.o. female with a hx of HTN and HLD who presents to clinic for follow-up.  Per review of the record, the patient was seen in the ER 09/11/20 for left chest soreness and numbness in the left arm. ECG without ischemic changes. Trop negative. CT head and neck without acute pathology.   She was seen in follow-up on 04/2021. Chest pain was atypical, however, she had significant DOE. Given her symptoms and family history of CAD in her sister at 79, we obtained a coronary CTA which was reassuringly normal with Ca score 0.   Today, the patient overall feels well. Continues to feel SOB with exertion. She has been seen by Pulmonary and is undergoing PFTs. No chest pain. Has some dizziness with position changes and with heat. No syncope. Blood pressure well controlled. Compliant with medications.  Otherwise, she is doing very well with no orthopnea, PND, palpitations. She is planned to have a cochlear implant and we discussed how she needs no further CV testing prior to her procedure. She is very interested in losing weight and is hoping to establish care with the healthy weight and wellness clinic.  Past Medical History:  Diagnosis Date   Abnormal EKG    Anemia    Anxiety    Arm paresthesia, left    Asthma    GERD (gastroesophageal reflux disease)    Hyperlipidemia    Hypertension    Precordial chest pain    Sleep apnea     Past Surgical History:  Procedure Laterality Date   ABDOMINAL HYSTERECTOMY     CESAREAN SECTION     x 3   CHOLECYSTECTOMY      Current Medications: Current Meds  Medication Sig   albuterol (PROVENTIL) (2.5 MG/3ML) 0.083% nebulizer solution Inhale into the lungs.    amLODipine (NORVASC) 2.5 MG tablet TAKE ONE TABLET IN THE MORNING   cetirizine (ZYRTEC) 10 MG tablet TAKE ONE TABLET IN THE MORNING   dexlansoprazole (DEXILANT) 60 MG capsule Take 1 capsule by mouth every morning.   escitalopram (LEXAPRO) 20 MG tablet TAKE ONE TABLET IN THE MORNING   gabapentin (NEURONTIN) 300 MG capsule SMARTSIG:1 Capsule(s) By Mouth Every Evening   GOODSENSE ASPIRIN LOW DOSE 81 MG EC tablet TAKE ONE TABLET IN THE MORNING   meloxicam (MOBIC) 15 MG tablet TAKE ONE TABLET IN THE MORNING   methylPREDNISolone (MEDROL DOSEPAK) 4 MG TBPK tablet Follow package insert   montelukast (SINGULAIR) 10 MG tablet SMARTSIG:1 Tablet(s) By Mouth Every Evening   pravastatin (PRAVACHOL) 20 MG tablet SMARTSIG:1 Tablet(s) By Mouth Every Evening   Prenatal Vit-Fe Fumarate-FA (PRENATAL MULTIVITAMIN) TABS tablet Take 1 tablet by mouth daily at 12 noon.   RESTASIS 0.05 % ophthalmic emulsion 1 drop 2 (two) times daily.   [DISCONTINUED] metoprolol tartrate (LOPRESSOR) 100 MG tablet Take 1 tablet (100 mg total) by mouth once for 1 dose. Take 90-120 minutes prior to scan.     Allergies:   Hydrocodone, Latex, and Grass extracts [gramineae pollens]   Social History   Socioeconomic History   Marital status: Married    Spouse name: Not on file   Number of children: 3  Years of education: Not on file   Highest education level: Some college, no degree  Occupational History   Not on file  Tobacco Use   Smoking status: Never   Smokeless tobacco: Never  Vaping Use   Vaping Use: Never used  Substance and Sexual Activity   Alcohol use: Never   Drug use: Never   Sexual activity: Not on file  Other Topics Concern   Not on file  Social History Narrative   Lives alone   Social Determinants of Health   Financial Resource Strain: Not on file  Food Insecurity: Not on file  Transportation Needs: Not on file  Physical Activity: Not on file  Stress: Not on file  Social Connections: Not on file      Family History: The patient's family history includes Cancer in her sister; Diabetes in her sister; Lupus in her sister; Ovarian cancer in her sister; Prostate cancer in her father. There is no history of Colon cancer, Esophageal cancer, or Stomach cancer.  ROS:   Please see the history of present illness.    .Review of Systems  Constitutional:  Negative for malaise/fatigue.  HENT:  Positive for hearing loss.   Eyes:  Negative for double vision.  Cardiovascular:  Negative for chest pain, palpitations, orthopnea, claudication, leg swelling and PND.  Gastrointestinal:  Negative for nausea and vomiting.  Musculoskeletal:  Negative for falls.  Neurological:  Positive for dizziness. Negative for loss of consciousness.     EKGs/Labs/Other Studies Reviewed:    The following studies were reviewed today: Coronary CTA 04/2021: FINDINGS: A 100 kV prospective scan was triggered in the descending thoracic aorta at 111 HU's. Axial non-contrast 3 mm slices were carried out through the heart. The data set was analyzed on a dedicated work station and scored using the Agatson method. Gantry rotation speed was 250 msecs and collimation was .6 mm. No beta blockade and 0.8 mg of sl NTG was given. The 3D data set was reconstructed in 5% intervals of the 67-82 % of the R-R cycle. Diastolic phases were analyzed on a dedicated work station using MPR, MIP and VRT modes. The patient received 80 cc of contrast.   Aorta: Normal size.  No calcifications.  No dissection.   Aortic Valve:  Trileaflet.  No calcifications.   Coronary Arteries:  Normal coronary origin.  Right dominance.   RCA is a large dominant artery that gives rise to PDA and PLA. There is no plaque.   Left main is a large artery that gives rise to LAD and LCX arteries.   LAD is a large vessel that has no plaque.   LCX is a non-dominant artery that gives rise to one large OM1 branch. There is no plaque.   Coronary Calcium Score:    Left main: 0   Left anterior descending artery: 0   Left circumflex artery: 0   Right coronary artery: 0   Total: 0   Percentile: 0   Other findings:   Normal pulmonary vein drainage into the left atrium.   Normal left atrial appendage without a thrombus.   Normal size of the pulmonary artery.   IMPRESSION: 1. Coronary calcium score of 0. This was 0 percentile for age and sex matched control.   2. Normal coronary origin with right dominance.   3. No evidence of CAD. CAD-RADS 0. No evidence of CAD (0%). Consider non-atherosclerotic causes of chest pain.  Myoview 2011:    Final interpretation: stress Myoview with no chest pain  and no  electrocardiographic changes.  The scintigraphic results show  breast attenuation but no ischemia.  The gated ejection fraction  was 64% and wall motion was normal.   EKG:  No new ECG today  Recent Labs: 06/17/2021: ALT 18; BUN 12; Creatinine, Ser 1.20; Hemoglobin 11.5; Platelets 381; Potassium 3.9; Sodium 141  Recent Lipid Panel    Component Value Date/Time   CHOL (H) 05/12/2009 1513    220        ATP III CLASSIFICATION:  <200     mg/dL   Desirable  [id number]  mg/dL   Borderline High  >=045>=240    mg/dL   High          TRIG 409168 (H) 05/12/2009 1513   HDL 41 05/12/2009 1513   CHOLHDL 5.4 05/12/2009 1513   VLDL 34 05/12/2009 1513   LDLCALC (H) 05/12/2009 1513    145        Total Cholesterol/HDL:CHD Risk Coronary Heart Disease Risk Table                     Men   Women  1/2 Average Risk   3.4   3.3  Average Risk       5.0   4.4  2 X Average Risk   9.6   7.1  3 X Average Risk  23.4   11.0        Use the calculated Patient Ratio above and the CHD Risk Table to determine the patient's CHD Risk.        ATP III CLASSIFICATION (LDL):  <100     mg/dL   Optimal  [id number]  mg/dL   Near or Above                    Optimal  130-159  mg/dL   Borderline  [id number]  mg/dL   High  >213>190     mg/dL   Very High           Physical Exam:     VS:  BP 120/80   Pulse 88   Ht 5\' 2"  (1.575 m)   Wt 185 lb 3.2 oz (84 kg)   SpO2 98%   BMI 33.87 kg/m     Wt Readings from Last 3 Encounters:  12/15/21 185 lb 3.2 oz (84 kg)  11/18/21 181 lb (82.1 kg)  07/01/21 179 lb (81.2 kg)     GEN:  Well nourished, well developed in no acute distress HEENT: Normal NECK: No JVD CARDIAC: RRR, no murmurs, rubs, gallops RESPIRATORY:  Clear to auscultation without rales, wheezing or rhonchi  ABDOMEN: Soft, non-tender, non-distended MUSCULOSKELETAL:  No edema, warm SKIN: Warm and dry NEUROLOGIC:  Alert and oriented x 3 PSYCHIATRIC:  Normal affect   ASSESSMENT:    1. Dyspnea on exertion   2. Obesity (BMI 30.0-34.9)   3. Precordial pain   4. Primary hypertension   5. Mixed hyperlipidemia    PLAN:    In order of problems listed above:  #DOE: #Atypical Chest Pain: Reassuring cardiac work-up with normal coronary CTA. Ca score 0. Established care with Pulmonary for further evaluation of DOE. -Follow-up with Pulm as scheduled  #Preoperative Evaluation Prior to Cochlear Implant: Reassuring cardiac work-up. Able to complete >4METs without issue. No further testing needed prior to procedure. Revised cardiac risk index 0.4%. -No further CV testing needed prior to procedure  #HTN: Controlled and at goal. -Continue amlodipine 2.5mg  daily  #HLD: -Continue prava 20mg   daily -Repeat lipids per PCP; they will send to our office  #Obesity with BMI 33.8: Patient is very interested in establishing care with healthy weight and wellness clinic. Discussed diet and lifestyle today and will provide referral.  Exercise recommendations: Goal of exercising for at least 30 minutes a day, at least 5 times per week.  Please exercise to a moderate exertion.  This means that while exercising it is difficult to speak in full sentences, however you are not so short of breath that you feel you must stop, and not so comfortable that you can carry on a full  conversation.  Exertion level should be approximately a 5/10, if 10 is the most exertion you can perform.  Diet recommendations: Recommend a heart healthy diet such as the Mediterranean diet.  This diet consists of plant based foods, healthy fats, lean meats, olive oil.  It suggests limiting the intake of simple carbohydrates such as white breads, pastries, and pastas.  It also limits the amount of red meat, wine, and dairy products such as cheese that one should consume on a daily basis.      Medication Adjustments/Labs and Tests Ordered: Current medicines are reviewed at length with the patient today.  Concerns regarding medicines are outlined above.  Orders Placed This Encounter  Procedures   Amb Ref to Medical Weight Management   No orders of the defined types were placed in this encounter.   Patient Instructions  Medication Instructions:  Your physician recommends that you continue on your current medications as directed. Please refer to the Current Medication list given to you today.  *If you need a refill on your cardiac medications before your next appointment, please call your pharmacy*  Follow-Up: At [facility], you and your health needs are our priority.  As part of our continuing mission to provide you with exceptional heart care, we have created designated Provider Care Teams.  These Care Teams include your primary Cardiologist (physician) and Advanced Practice Providers (APPs -  Physician Assistants and Nurse Practitioners) who all work together to provide you with the care you need, when you need it.  We recommend signing up for the patient portal called "MyChart".  Sign up information is provided on this After Visit Summary.  MyChart is used to connect with patients for Virtual Visits (Telemedicine).  Patients are able to view lab/test results, encounter notes, upcoming appointments, etc.  Non-urgent messages can be sent to your provider as well.   To learn more  about what you can do with MyChart, go to [email or web address].    Your next appointment:   1 year(s)  The format for your next appointment:   In Person  Provider:   Dr. Shari Prows  Referral to Weight Management   Mediterranean Diet A Mediterranean diet refers to food and lifestyle choices that are based on the traditions of countries located on the [employer]. It focuses on eating more fruits, vegetables, whole grains, beans, nuts, seeds, and heart-healthy fats, and eating less dairy, meat, eggs, and processed foods with added sugar, salt, and fat. This way of eating has been shown to help prevent certain conditions and improve outcomes for people who have chronic diseases, like kidney disease and heart disease. What are tips for following this plan? Reading food labels Check the serving size of packaged foods. For foods such as rice and pasta, the serving size refers to the amount of cooked product, not dry. Check the total fat in packaged foods. Avoid foods that  have saturated fat or trans fats. Check the ingredient list for added sugars, such as corn syrup. Shopping  Buy a variety of foods that offer a balanced diet, including: Fresh fruits and vegetables (produce). Grains, beans, nuts, and seeds. Some of these may be available in unpackaged forms or large amounts (in bulk). Fresh seafood. Poultry and eggs. Low-fat dairy products. Buy whole ingredients instead of prepackaged foods. Buy fresh fruits and vegetables in-season from local farmers markets. Buy plain frozen fruits and vegetables. If you do not have access to quality fresh seafood, buy precooked frozen shrimp or canned fish, such as tuna, salmon, or sardines. Stock your pantry so you always have certain foods on hand, such as olive oil, canned tuna, canned tomatoes, rice, pasta, and beans. Cooking Cook foods with extra-virgin olive oil instead of using butter or other vegetable oils. Have meat as a side dish,  and have vegetables or grains as your main dish. This means having meat in small portions or adding small amounts of meat to foods like pasta or stew. Use beans or vegetables instead of meat in common dishes like chili or lasagna. Experiment with different cooking methods. Try roasting, broiling, steaming, and sauting vegetables. Add frozen vegetables to soups, stews, pasta, or rice. Add nuts or seeds for added healthy fats and plant protein at each meal. You can add these to yogurt, salads, or vegetable dishes. Marinate fish or vegetables using olive oil, lemon juice, garlic, and fresh herbs. Meal planning Plan to eat one vegetarian meal one day each week. Try to work up to two vegetarian meals, if possible. Eat seafood two or more times a week. Have healthy snacks readily available, such as: Vegetable sticks with hummus. Greek yogurt. Fruit and nut trail mix. Eat balanced meals throughout the week. This includes: Fruit: 2-3 servings a day. Vegetables: 4-5 servings a day. Low-fat dairy: 2 servings a day. Fish, poultry, or lean meat: 1 serving a day. Beans and legumes: 2 or more servings a week. Nuts and seeds: 1-2 servings a day. Whole grains: 6-8 servings a day. Extra-virgin olive oil: 3-4 servings a day. Limit red meat and sweets to only a few servings a month. Lifestyle  Cook and eat meals together with your family, when possible. Drink enough fluid to keep your urine pale yellow. Be physically active every day. This includes: Aerobic exercise like running or swimming. Leisure activities like gardening, walking, or housework. Get 7-8 hours of sleep each night. If recommended by your health care provider, drink red wine in moderation. This means 1 glass a day for nonpregnant women and 2 glasses a day for men. A glass of wine equals 5 oz (150 mL). What foods should I eat? Fruits Apples. Apricots. Avocado. Berries. Bananas. Cherries. Dates. Figs. Grapes. Lemons. Melon. Oranges.  Peaches. Plums. Pomegranate. Vegetables Artichokes. Beets. Broccoli. Cabbage. Carrots. Eggplant. Green beans. Chard. Kale. Spinach. Onions. Leeks. Peas. Squash. Tomatoes. Peppers. Radishes. Grains Whole-grain pasta. Brown rice. Bulgur wheat. Polenta. Couscous. Whole-wheat bread. Orpah Cobb. Meats and other proteins Beans. Almonds. Sunflower seeds. Pine nuts. Peanuts. Cod. Salmon. Scallops. Shrimp. Tuna. Tilapia. Clams. Oysters. Eggs. Poultry without skin. Dairy Low-fat milk. Cheese. Greek yogurt. Fats and oils Extra-virgin olive oil. Avocado oil. Grapeseed oil. Beverages Water. Red wine. Herbal tea. Sweets and desserts Greek yogurt with honey. Baked apples. Poached pears. Trail mix. Seasonings and condiments Basil. Cilantro. Coriander. Cumin. Mint. Parsley. Sage. Rosemary. Tarragon. Garlic. Oregano. Thyme. Pepper. Balsamic vinegar. Tahini. Hummus. Tomato sauce. Olives. Mushrooms. The items listed above  may not be a complete list of foods and beverages you can eat. Contact a dietitian for more information. What foods should I limit? This is a list of foods that should be eaten rarely or only on special occasions. Fruits Fruit canned in syrup. Vegetables Deep-fried potatoes (french fries). Grains Prepackaged pasta or rice dishes. Prepackaged cereal with added sugar. Prepackaged snacks with added sugar. Meats and other proteins Beef. Pork. Lamb. Poultry with skin. Hot dogs. Tomasa Blase. Dairy Ice cream. Sour cream. Whole milk. Fats and oils Butter. Canola oil. Vegetable oil. Beef fat (tallow). Lard. Beverages Juice. Sugar-sweetened soft drinks. Beer. Liquor and spirits. Sweets and desserts Cookies. Cakes. Pies. Candy. Seasonings and condiments Mayonnaise. Pre-made sauces and marinades. The items listed above may not be a complete list of foods and beverages you should limit. Contact a dietitian for more information. Summary The Mediterranean diet includes both food and lifestyle  choices. Eat a variety of fresh fruits and vegetables, beans, nuts, seeds, and whole grains. Limit the amount of red meat and sweets that you eat. If recommended by your health care provider, drink red wine in moderation. This means 1 glass a day for nonpregnant women and 2 glasses a day for men. A glass of wine equals 5 oz (150 mL). This information is not intended to replace advice given to you by your health care provider. Make sure you discuss any questions you have with your health care provider. Document Revised: 05/11/2019 Document Reviewed: 03/08/2019 Elsevier Patient Education  2023 [employer].  Other Instructions           Signed, Meriam Sprague, MD  12/15/2021 10:39 AM    [facility]

## 2021-12-15 ENCOUNTER — Ambulatory Visit: Payer: Medicaid Other | Attending: Cardiology | Admitting: Cardiology

## 2021-12-15 ENCOUNTER — Encounter: Payer: Self-pay | Admitting: Cardiology

## 2021-12-15 VITALS — BP 120/80 | HR 88 | Ht 62.0 in | Wt 185.2 lb

## 2021-12-15 DIAGNOSIS — R072 Precordial pain: Secondary | ICD-10-CM

## 2021-12-15 DIAGNOSIS — I10 Essential (primary) hypertension: Secondary | ICD-10-CM

## 2021-12-15 DIAGNOSIS — E782 Mixed hyperlipidemia: Secondary | ICD-10-CM

## 2021-12-15 DIAGNOSIS — E669 Obesity, unspecified: Secondary | ICD-10-CM

## 2021-12-15 DIAGNOSIS — R0609 Other forms of dyspnea: Secondary | ICD-10-CM | POA: Diagnosis not present

## 2021-12-15 NOTE — Patient Instructions (Addendum)
Medication Instructions:  Your physician recommends that you continue on your current medications as directed. Please refer to the Current Medication list given to you today.  *If you need a refill on your cardiac medications before your next appointment, please call your pharmacy*  Follow-Up: At [facility], you and your health needs are our priority.  As part of our continuing mission to provide you with exceptional heart care, we have created designated Provider Care Teams.  These Care Teams include your primary Cardiologist (physician) and Advanced Practice Providers (APPs -  Physician Assistants and Nurse Practitioners) who all work together to provide you with the care you need, when you need it.  We recommend signing up for the patient portal called "MyChart".  Sign up information is provided on this After Visit Summary.  MyChart is used to connect with patients for Virtual Visits (Telemedicine).  Patients are able to view lab/test results, encounter notes, upcoming appointments, etc.  Non-urgent messages can be sent to your provider as well.   To learn more about what you can do with MyChart, go to [email or web address].    Your next appointment:   1 year(s)  The format for your next appointment:   In Person  Provider:   Dr. Shari Prows  Referral to Weight Management   Mediterranean Diet A Mediterranean diet refers to food and lifestyle choices that are based on the traditions of countries located on the [employer]. It focuses on eating more fruits, vegetables, whole grains, beans, nuts, seeds, and heart-healthy fats, and eating less dairy, meat, eggs, and processed foods with added sugar, salt, and fat. This way of eating has been shown to help prevent certain conditions and improve outcomes for people who have chronic diseases, like kidney disease and heart disease. What are tips for following this plan? Reading food labels Check the serving size of packaged  foods. For foods such as rice and pasta, the serving size refers to the amount of cooked product, not dry. Check the total fat in packaged foods. Avoid foods that have saturated fat or trans fats. Check the ingredient list for added sugars, such as corn syrup. Shopping  Buy a variety of foods that offer a balanced diet, including: Fresh fruits and vegetables (produce). Grains, beans, nuts, and seeds. Some of these may be available in unpackaged forms or large amounts (in bulk). Fresh seafood. Poultry and eggs. Low-fat dairy products. Buy whole ingredients instead of prepackaged foods. Buy fresh fruits and vegetables in-season from local farmers markets. Buy plain frozen fruits and vegetables. If you do not have access to quality fresh seafood, buy precooked frozen shrimp or canned fish, such as tuna, salmon, or sardines. Stock your pantry so you always have certain foods on hand, such as olive oil, canned tuna, canned tomatoes, rice, pasta, and beans. Cooking Cook foods with extra-virgin olive oil instead of using butter or other vegetable oils. Have meat as a side dish, and have vegetables or grains as your main dish. This means having meat in small portions or adding small amounts of meat to foods like pasta or stew. Use beans or vegetables instead of meat in common dishes like chili or lasagna. Experiment with different cooking methods. Try roasting, broiling, steaming, and sauting vegetables. Add frozen vegetables to soups, stews, pasta, or rice. Add nuts or seeds for added healthy fats and plant protein at each meal. You can add these to yogurt, salads, or vegetable dishes. Marinate fish or vegetables using olive oil, lemon juice, garlic,  and fresh herbs. Meal planning Plan to eat one vegetarian meal one day each week. Try to work up to two vegetarian meals, if possible. Eat seafood two or more times a week. Have healthy snacks readily available, such as: Vegetable sticks with  hummus. Greek yogurt. Fruit and nut trail mix. Eat balanced meals throughout the week. This includes: Fruit: 2-3 servings a day. Vegetables: 4-5 servings a day. Low-fat dairy: 2 servings a day. Fish, poultry, or lean meat: 1 serving a day. Beans and legumes: 2 or more servings a week. Nuts and seeds: 1-2 servings a day. Whole grains: 6-8 servings a day. Extra-virgin olive oil: 3-4 servings a day. Limit red meat and sweets to only a few servings a month. Lifestyle  Cook and eat meals together with your family, when possible. Drink enough fluid to keep your urine pale yellow. Be physically active every day. This includes: Aerobic exercise like running or swimming. Leisure activities like gardening, walking, or housework. Get 7-8 hours of sleep each night. If recommended by your health care provider, drink red wine in moderation. This means 1 glass a day for nonpregnant women and 2 glasses a day for men. A glass of wine equals 5 oz (150 mL). What foods should I eat? Fruits Apples. Apricots. Avocado. Berries. Bananas. Cherries. Dates. Figs. Grapes. Lemons. Melon. Oranges. Peaches. Plums. Pomegranate. Vegetables Artichokes. Beets. Broccoli. Cabbage. Carrots. Eggplant. Green beans. Chard. Kale. Spinach. Onions. Leeks. Peas. Squash. Tomatoes. Peppers. Radishes. Grains Whole-grain pasta. Brown rice. Bulgur wheat. Polenta. Couscous. Whole-wheat bread. Orpah Cobb. Meats and other proteins Beans. Almonds. Sunflower seeds. Pine nuts. Peanuts. Cod. Salmon. Scallops. Shrimp. Tuna. Tilapia. Clams. Oysters. Eggs. Poultry without skin. Dairy Low-fat milk. Cheese. Greek yogurt. Fats and oils Extra-virgin olive oil. Avocado oil. Grapeseed oil. Beverages Water. Red wine. Herbal tea. Sweets and desserts Greek yogurt with honey. Baked apples. Poached pears. Trail mix. Seasonings and condiments Basil. Cilantro. Coriander. Cumin. Mint. Parsley. Sage. Rosemary. Tarragon. Garlic. Oregano. Thyme.  Pepper. Balsamic vinegar. Tahini. Hummus. Tomato sauce. Olives. Mushrooms. The items listed above may not be a complete list of foods and beverages you can eat. Contact a dietitian for more information. What foods should I limit? This is a list of foods that should be eaten rarely or only on special occasions. Fruits Fruit canned in syrup. Vegetables Deep-fried potatoes (french fries). Grains Prepackaged pasta or rice dishes. Prepackaged cereal with added sugar. Prepackaged snacks with added sugar. Meats and other proteins Beef. Pork. Lamb. Poultry with skin. Hot dogs. Tomasa Blase. Dairy Ice cream. Sour cream. Whole milk. Fats and oils Butter. Canola oil. Vegetable oil. Beef fat (tallow). Lard. Beverages Juice. Sugar-sweetened soft drinks. Beer. Liquor and spirits. Sweets and desserts Cookies. Cakes. Pies. Candy. Seasonings and condiments Mayonnaise. Pre-made sauces and marinades. The items listed above may not be a complete list of foods and beverages you should limit. Contact a dietitian for more information. Summary The Mediterranean diet includes both food and lifestyle choices. Eat a variety of fresh fruits and vegetables, beans, nuts, seeds, and whole grains. Limit the amount of red meat and sweets that you eat. If recommended by your health care provider, drink red wine in moderation. This means 1 glass a day for nonpregnant women and 2 glasses a day for men. A glass of wine equals 5 oz (150 mL). This information is not intended to replace advice given to you by your health care provider. Make sure you discuss any questions you have with your health care provider. Document Revised: 05/11/2019  Document Reviewed: 03/08/2019 Elsevier Patient Education  [employer].  Other Instructions

## 2022-04-21 ENCOUNTER — Encounter (INDEPENDENT_AMBULATORY_CARE_PROVIDER_SITE_OTHER): Payer: Medicaid Other | Admitting: Family Medicine

## 2022-05-24 ENCOUNTER — Telehealth: Payer: Self-pay | Admitting: Cardiology

## 2022-05-24 NOTE — Telephone Encounter (Signed)
Spoke with [place] who is currently with the patient at her physical therapy appointment. She states that the patient came in today and got on the bike and complained of palpitations and shortness of breath. [place] got the patient off of the bike and checked vitals showing BP of 137/96 and HR 96. She gave the patient some water and had her lie down. The patient is currently feeling better with no shortness of breath or palpitations. She does report that she did have some dizziness on the bike as well. Patients admits to only taking sips of water with her medications this morning. Encouraged patient to ensure hydration prior to any physical activity. She will continue to monitor symptoms and let us know if they return. Will make Dr. Johney Frame aware for any further recommendations.

## 2022-05-24 NOTE — Telephone Encounter (Signed)
Gair, Raea - 05/24/2022  9:34 AM Freada Bergeron, MD  Sent: Mon May 24, 2022 10:36 AM  To: Bernestine Amass, RN  Cc: Nuala Alpha, LPN         Message  Agree with your plan! Thank you!

## 2022-05-24 NOTE — Telephone Encounter (Signed)
Patient c/o Palpitations:  High priority if patient c/o lightheadedness, shortness of breath, or chest pain  How long have you had palpitations/irregular HR/ Afib? Are you having the symptoms now? She states pt was complaining of her heart palpitating while in outpatient physical therapy   Are you currently experiencing lightheadedness, SOB or CP? SOB, soreness around her shoulder but no CP   Do you have a history of afib (atrial fibrillation) or irregular heart rhythm?   Have you checked your BP or HR? (document readings if available):  Seated bp: 137/96 hr 96 about 10 minutes ago   Are you experiencing any other symptoms? B

## 2022-05-25 ENCOUNTER — Telehealth: Payer: Self-pay | Admitting: Cardiology

## 2022-05-25 NOTE — Telephone Encounter (Signed)
Pt c/o of Chest Pain: STAT if CP now or developed within 24 hours  1. Are you having CP right now? No   2. Are you experiencing any other symptoms (ex. SOB, nausea, vomiting, sweating)? SOB & headache  3. How long have you been experiencing CP? Past 3 days  4. Is your CP continuous or coming and going? Coming and going   5. Have you taken Nitroglycerin? No    CP and SOB has worsened in past 3 days causing her to want an appt.  ?

## 2022-05-25 NOTE — Telephone Encounter (Signed)
Pt had her Physical Therapist [place] call yesterday with her ongoing complaints of mild headache, mild intermittent chest pain, sob over the last couple weeks.   Pt attends PT 3 times a week and drives herself there to attend those sessions.   Pt complained mostly of palpitations, intermittent chest discomfort, mild sob, and headache when attending PT sessions.  She also voiced occasional dizziness when riding the bike at PT.   Her vitals have been stable with last readings taken by PT at 139/97 HR-81.  Pt states she does not monitor these at home and does not have a cuff to monitor these at home.   Pt states her symptoms of intermittent chest pain, sob, dizziness, and headache have been ongoing for a couple weeks, and are worsened at PT.   Pt states she would like an appt with our office sometime soon, for further evaluation of complaints.   Pt denied active chest pain and sob at this time.  She denied orthopnea, pre-syncopal or syncopal episodes.   Pt had a cardiac CT done last Feb 2023, and results were normal, with a score of 0 and no evidence of blockages in her heart arteries.  Her chest pain was not due to her heart arteries and no changes were made at that time.  Reassured the pt of that result.  Scheduled the pt a next available appt with our office for next Monday 2/12 at 2:45 pm with Christen Bame NP.  Advised her to arrive 15 mins prior to that appt.   Advised the pt to continue her current regimen and bring in a log of her BP/HR recordings from her PT sessions, and bring those to her appt with Sharyn Lull next Monday.  ED precautions provided to the pt if symptoms worsen/persist between now and her appt with Christen Bame NP next week.  Also provided her the option to follow-up with her PCP, for work-up of non-cardiac systems.   Advised her to hydrate well and wear compressions during the day and especially at PT.  Advised her to avoid any excessive caffeine and avoid foods  high in salt.   Pt aware that I will make Dr. Johney Frame aware of this plan.  Pt verbalized understanding and agrees with this plan.  Pt was more than gracious for all the assistance provided.

## 2022-05-26 ENCOUNTER — Emergency Department (HOSPITAL_BASED_OUTPATIENT_CLINIC_OR_DEPARTMENT_OTHER): Payer: Medicaid Other

## 2022-05-26 ENCOUNTER — Encounter (HOSPITAL_BASED_OUTPATIENT_CLINIC_OR_DEPARTMENT_OTHER): Payer: Self-pay | Admitting: Pediatrics

## 2022-05-26 ENCOUNTER — Emergency Department (HOSPITAL_BASED_OUTPATIENT_CLINIC_OR_DEPARTMENT_OTHER)
Admission: EM | Admit: 2022-05-26 | Discharge: 2022-05-26 | Disposition: A | Discharge status: Home / Self Care | Payer: Medicaid Other | Attending: Emergency Medicine | Admitting: Emergency Medicine

## 2022-05-26 ENCOUNTER — Other Ambulatory Visit: Payer: Self-pay

## 2022-05-26 DIAGNOSIS — Z20822 Contact with and (suspected) exposure to covid-19: Secondary | ICD-10-CM | POA: Diagnosis not present

## 2022-05-26 DIAGNOSIS — I10 Essential (primary) hypertension: Secondary | ICD-10-CM | POA: Insufficient documentation

## 2022-05-26 DIAGNOSIS — Z79899 Other long term (current) drug therapy: Secondary | ICD-10-CM | POA: Diagnosis not present

## 2022-05-26 DIAGNOSIS — Z91040 Latex allergy status: Secondary | ICD-10-CM | POA: Insufficient documentation

## 2022-05-26 DIAGNOSIS — R519 Headache, unspecified: Secondary | ICD-10-CM | POA: Diagnosis present

## 2022-05-26 DIAGNOSIS — B349 Viral infection, unspecified: Secondary | ICD-10-CM | POA: Diagnosis not present

## 2022-05-26 LAB — CBC
HCT: 36 % (ref 36.0–46.0)
Hemoglobin: 12 g/dL (ref 12.0–15.0)
MCH: 31.7 pg (ref 26.0–34.0)
MCHC: 33.3 g/dL (ref 30.0–36.0)
MCV: 95.2 fL (ref 80.0–100.0)
Platelets: 397 10*3/uL (ref 150–400)
RBC: 3.78 MIL/uL — ABNORMAL LOW (ref 3.87–5.11)
RDW: 13.9 % (ref 11.5–15.5)
WBC: 6.3 10*3/uL (ref 4.0–10.5)
nRBC: 0 % (ref 0.0–0.2)

## 2022-05-26 LAB — PREGNANCY, URINE: Preg Test, Ur: NEGATIVE

## 2022-05-26 LAB — BASIC METABOLIC PANEL
Anion gap: 7 (ref 5–15)
BUN: 13 mg/dL (ref 6–20)
CO2: 27 mmol/L (ref 22–32)
Calcium: 8.6 mg/dL — ABNORMAL LOW (ref 8.9–10.3)
Chloride: 102 mmol/L (ref 98–111)
Creatinine, Ser: 1.06 mg/dL — ABNORMAL HIGH (ref 0.44–1.00)
GFR, Estimated: >60 mL/min (ref >60)
Glucose, Bld: 122 mg/dL — ABNORMAL HIGH (ref 70–99)
Potassium: 3.5 mmol/L (ref 3.5–5.1)
Sodium: 136 mmol/L (ref 135–145)

## 2022-05-26 LAB — TROPONIN I (HIGH SENSITIVITY): Troponin I (High Sensitivity): 3 ng/L (ref <18)

## 2022-05-26 LAB — RESP PANEL BY RT-PCR (RSV, FLU A&B, COVID)  RVPGX2
Influenza A by PCR: NEGATIVE
Influenza B by PCR: NEGATIVE
Resp Syncytial Virus by PCR: NEGATIVE
SARS Coronavirus 2 by RT PCR: NEGATIVE

## 2022-05-26 NOTE — ED Provider Notes (Signed)
[facility] Provider Note   CSN: [id number] Arrival date & time: 05/26/22  1049     History  Chief Complaint  Patient presents with   Dizziness   Chest Pain   Hypertension    Kathleen Foley is a 57 y.o. female with a history of hypertension presented to ED with high blood pressure, headache, chest pains, dry cough.  She reports to feeling members are sick with viral URI type symptoms in the house.  She herself has had symptoms about 5 days.  She reports her chest pains are sharp, electrical feeling which lasts a few seconds, in the middle of her chest.  She also has a frontal headache.  She noted her blood pressure was spiking was higher than normal in the past couple of days.  She takes a small dose amlodipine for blood pressure only, no other medications.  HPI     Home Medications Prior to Admission medications   Medication Sig Start Date End Date Taking? Authorizing Provider  albuterol (PROVENTIL) (2.5 MG/3ML) 0.083% nebulizer solution Inhale into the lungs. 05/03/19   [provider]  amLODipine (NORVASC) 2.5 MG tablet TAKE ONE TABLET IN THE MORNING 11/12/20   [provider]  cetirizine (ZYRTEC) 10 MG tablet TAKE ONE TABLET IN THE MORNING 11/12/20   [provider]  dexlansoprazole (DEXILANT) 60 MG capsule Take 1 capsule by mouth every morning. 11/12/20   [provider]  escitalopram (LEXAPRO) 20 MG tablet TAKE ONE TABLET IN THE MORNING 09/16/20   [provider]  gabapentin (NEURONTIN) 300 MG capsule SMARTSIG:1 Capsule(s) By Mouth Every Evening 11/12/20   [provider]  GOODSENSE ASPIRIN LOW DOSE 81 MG EC tablet TAKE ONE TABLET IN THE MORNING 10/13/20   [provider]  meloxicam (MOBIC) 15 MG tablet TAKE ONE TABLET IN THE MORNING 11/12/20   [provider]  methylPREDNISolone (MEDROL DOSEPAK) 4 MG TBPK tablet Follow package insert 11/18/21   Curatolo, Adam, DO   montelukast (SINGULAIR) 10 MG tablet SMARTSIG:1 Tablet(s) By Mouth Every Evening 11/12/20   [provider]  pravastatin (PRAVACHOL) 20 MG tablet SMARTSIG:1 Tablet(s) By Mouth Every Evening 11/12/20   [provider]  Prenatal Vit-Fe Fumarate-FA (PRENATAL MULTIVITAMIN) TABS tablet Take 1 tablet by mouth daily at 12 noon.    [provider]  RESTASIS 0.05 % ophthalmic emulsion 1 drop 2 (two) times daily. 11/12/20   [provider]      Allergies    Hydrocodone, Latex, and Grass extracts [gramineae pollens]    Review of Systems   Review of Systems  Physical Exam Updated Vital Signs BP (!) 127/93   Pulse 77   Temp 98.9 F (37.2 C) (Oral)   Ht 5\' 3"  (1.6 m)   Wt 83.5 kg   SpO2 95%   BMI 32.59 kg/m  Physical Exam Constitutional:      General: She is not in acute distress.    Comments: Patient is hard of hearing  HENT:     Head: Normocephalic and atraumatic.  Eyes:     Conjunctiva/sclera: Conjunctivae normal.     Pupils: Pupils are equal, round, and reactive to light.  Cardiovascular:     Rate and Rhythm: Normal rate and regular rhythm.  Pulmonary:     Effort: Pulmonary effort is normal. No respiratory distress.  Abdominal:     General: There is no distension.     Tenderness: There is no abdominal tenderness.  Skin:  General: Skin is warm and dry.  Neurological:     General: No focal deficit present.     Mental Status: She is alert. Mental status is at baseline.  Psychiatric:        Mood and Affect: Mood normal.        Behavior: Behavior normal.     ED Results / Procedures / Treatments   Labs (all labs ordered are listed, but only abnormal results are displayed) Labs Reviewed  BASIC METABOLIC PANEL - Abnormal; Notable for the following components:      Result Value   Glucose, Bld 122 (*)    Creatinine, Ser 1.06 (*)    Calcium 8.6 (*)    All other components within normal limits  CBC - Abnormal; Notable for the following  components:   RBC 3.78 (*)    All other components within normal limits  RESP PANEL BY RT-PCR (RSV, FLU A&B, COVID)  RVPGX2  PREGNANCY, URINE  TROPONIN I (HIGH SENSITIVITY)    EKG None  Radiology DG Chest 2 View  Result Date: 05/26/2022 CLINICAL DATA:  Chest pain. EXAM: CHEST - 2 VIEW COMPARISON:  05/02/2021 FINDINGS: The cardiac silhouette, mediastinal and hilar contours are normal. The lungs are clear of an acute process. No infiltrates, edema or effusions. No pulmonary lesions or pulmonary nodules. The bony thorax is intact. IMPRESSION: No acute cardiopulmonary findings. Electronically Signed   By: Marijo Sanes M.D.   On: 05/26/2022 11:53    Procedures Procedures    Medications Ordered in ED Medications - No data to display  ED Course/ Medical Decision Making/ A&P                             Medical Decision Making Amount and/or Complexity of Data Reviewed Labs: ordered. Radiology: ordered.   This patient presents to the ED with concern for chest pains, lightheadedness, congestion, cough. This involves an extensive number of treatment options, and is a complaint that carries with it a high risk of complications and morbidity.  The differential diagnosis includes viral URI versus bacterial infection versus atypical ACS versus other  I ordered and personally interpreted labs.  The pertinent results include: Labs are unremarkable.  Very low suspicion for ACS given his presentation.   I suspect is likely viral syndrome given her constellation of symptoms, and respecting blood pressure may be reactive to her discomfort.  I ordered imaging studies including x-ray of the chest I independently visualized and interpreted imaging which showed no focal infiltrate or abnormalities I agree with the radiologist interpretation  The patient was maintained on a cardiac monitor.  I personally viewed and interpreted the cardiac monitored which showed an underlying rhythm of:   Per my  interpretation the patient's ECG shows sinus rhythm with no acute ischemic changes   I have reviewed the patients home medicines and have made adjustments as needed  Test Considered: I doubt acute PE in this clinical setting I have a low suspicion for meningitis or SAH do not see any indication for lumbar puncture or neuroimaging at this time.  Social Determinants of Health: the patient is hard of hearing.  Please note, she reports that her sister family member may be calling in for her Covid results tonight she gives permission for the results to be disclosed on the phone.  The patient is not able to navigate electronically to her patient portal otherwise.  Dispostion:  After consideration of the diagnostic  results and the patients response to treatment, I feel that the patent would benefit from outpatient PCP follow-up         Final Clinical Impression(s) / ED Diagnoses Final diagnoses:  Viral illness    Rx / DC Orders ED Discharge Orders     None         Langston Masker Carola Rhine, MD 05/26/22 1429

## 2022-05-26 NOTE — ED Notes (Signed)
Discharge paperwork reviewed entirely with patient, including Rx's and follow up care. Pain was under control. Pt verbalized understanding as well as all parties involved. No questions or concerns voiced at the time of discharge. No acute distress noted.   Pt ambulated out to PVA without incident or assistance.  

## 2022-05-26 NOTE — ED Triage Notes (Signed)
C/O chest pain, light headedness, neck pain and high blood pressure since Thursday.

## 2022-05-26 NOTE — Discharge Instructions (Signed)
[address] [place], [place] [address]  Phone:[phone]  Your COVID and flu test should be available in the next several hours.  You can log onto your patient portal online to see the results of the test as soon as they are available.  Otherwise, you can call at the number above to request the result of your test.  *  You may be experiencing a virus.  Most viruses last 5 to 7 days.  Viruses can cause headaches, stomach pains, shortness of breath and coughing.    However, if you begin having worsening difficulty breathing, severe chest pain, lightheadedness or loss of consciousness, or any other emergency concerns, please return to the emergency department.

## 2022-05-29 NOTE — Progress Notes (Unsigned)
Cardiology Office Note:    Date:  06/01/2022   ID:  Kathleen Foley, DOB 1966/02/06, MRN [id number]  PCP:  Leona Singleton, FNP   [facility] Providers Cardiologist:  Freada Bergeron, MD     Referring MD: Leona Singleton, FNP   Chief Complaint: chest pain  History of Present Illness:    Kathleen Foley is a very pleasant 57 y.o. female with a hx of HTN, HLD, family history of CAD with sister who had MI in her 66s, cochlear implant, and obesity.   Referred to cardiology for evaluation of chest pain and seen by Dr. Johney Frame on 04/21/2021. Normal stress test in 2011. She was in the ER 09/11/2020 for left chest soreness and numbness in the left arm.  ECG without ischemic changes.  Troponin negative.  CT head and neck without acute pathology.  Pain atypical.  Given reassuring workup she was discharged home and referred to cardiology for further workup.  At office visit 05/11/21 she reported she continues to have shooting pain in her chest which is associated with shortness of breath occurring about 2 times per week.  Symptoms last a couple of seconds and resolve ithout intervention.  Can be worsened when laying on her right side.  Symptoms do not correlate with exertion.  She admits she is not very active and is unable to walk a full flight of stairs without stopping to catch her breath.  She reported more fatigue recently. No syncope.  Occasional ankle edema after prolonged standing.  She also reported hair loss.  TSH was normal.  She was started on spironolactone 25 mg daily.  Coronary CTA 05/19/2021 revealed calcium score of 0, normal coronary origin with right dominance, no evidence of CAD.   At last cardiology visit 12/15/2021 with Dr. Johney Frame, she continued to report chest pain.  She was given reassurance of cardiac workup advised to follow-up with pulmonology.  He was referred to healthy weight loss and wellness for weight management.  Her PT contacted our office 05/24/2022 to report  she is having palpitations, intermittent chest discomfort, mild shortness of breath, and headaches during PT.  She also voiced occasional dizziness when riding the bike.  She was advised to ensure good hydration and avoid caffeine.  She was scheduled for office visit.  Today, she is here for evaluation of pain in the middle of her chest and left breast and shoulder blade during exercise. This occurs intermittently, concerned about recent episode reported above. Admits to eating dairy products which do not agree with her prior to onset of symptoms. Has chronic acid reflux.  No CAD diaphoresis, SOB, n/v with symptoms. She does continue to have shortness of breath with minimal exertion. No orthopnea, PND, or edema.  She does not exercise on a consistent basis.  Denies presyncope, syncope, bleeding concerns.  Past Medical History:  Diagnosis Date   Abnormal EKG    Anemia    Anxiety    Arm paresthesia, left    Asthma    GERD (gastroesophageal reflux disease)    Hyperlipidemia    Hypertension    Precordial chest pain    Sleep apnea     Past Surgical History:  Procedure Laterality Date   ABDOMINAL HYSTERECTOMY     CESAREAN SECTION     x 3   CHOLECYSTECTOMY      Current Medications: Current Meds  Medication Sig   albuterol (PROVENTIL) (2.5 MG/3ML) 0.083% nebulizer solution Inhale into the lungs.   albuterol (VENTOLIN HFA)  108 (90 Base) MCG/ACT inhaler Inhale 2 puffs into the lungs every 4 (four) hours as needed for shortness of breath or wheezing.   amLODipine (NORVASC) 2.5 MG tablet TAKE ONE TABLET IN THE MORNING   cetirizine (ZYRTEC) 10 MG tablet TAKE ONE TABLET IN THE MORNING   dexlansoprazole (DEXILANT) 60 MG capsule Take 1 capsule by mouth every morning.   escitalopram (LEXAPRO) 20 MG tablet TAKE ONE TABLET IN THE MORNING   gabapentin (NEURONTIN) 300 MG capsule SMARTSIG:1 Capsule(s) By Mouth Every Evening   GOODSENSE ASPIRIN LOW DOSE 81 MG EC tablet TAKE ONE TABLET IN THE MORNING    pravastatin (PRAVACHOL) 20 MG tablet SMARTSIG:1 Tablet(s) By Mouth Every Evening   Prenatal Vit-Fe Fumarate-FA (PRENATAL MULTIVITAMIN) TABS tablet Take 1 tablet by mouth daily at 12 noon.   RESTASIS 0.05 % ophthalmic emulsion 1 drop 2 (two) times daily.   SYMBICORT 160-4.5 MCG/ACT inhaler Inhale 2 puffs into the lungs 2 (two) times daily as needed.     Allergies:   Hydrocodone, Latex, and Grass extracts [gramineae pollens]   Social History   Socioeconomic History   Marital status: Married    Spouse name: Not on file   Number of children: 3   Years of education: Not on file   Highest education level: Some college, no degree  Occupational History   Not on file  Tobacco Use   Smoking status: Never   Smokeless tobacco: Never  Vaping Use   Vaping Use: Never used  Substance and Sexual Activity   Alcohol use: Never   Drug use: Never   Sexual activity: Not on file  Other Topics Concern   Not on file  Social History Narrative   Lives alone   Social Determinants of Health   Financial Resource Strain: Not on file  Food Insecurity: Not on file  Transportation Needs: Not on file  Physical Activity: Not on file  Stress: Not on file  Social Connections: Not on file     Family History: The patient's family history includes Cancer in her sister; Diabetes in her sister; Lupus in her sister; Ovarian cancer in her sister; Prostate cancer in her father. There is no history of Colon cancer, Esophageal cancer, or Stomach cancer.  ROS:   Please see the history of present illness.    + chest pain + SOB All other systems reviewed and are negative.  Labs/Other Studies Reviewed:    The following studies were reviewed today:  Coronary CTA 05/19/21 IMPRESSION: 1. Coronary calcium score of 0. This was 0 percentile for age and sex matched control.   2. Normal coronary origin with right dominance.   3. No evidence of CAD. CAD-RADS 0. No evidence of CAD (0%). Consider non-atherosclerotic  causes of chest pain.  Recent Labs: 06/17/2021: ALT 18 05/26/2022: BUN 13; Creatinine, Ser 1.06; Hemoglobin 12.0; Platelets 397; Potassium 3.5; Sodium 136  Recent Lipid Panel    Risk Assessment/Calculations:      Physical Exam:    VS:  BP 116/84   Pulse 82   Ht 5' 3"$  (1.6 m)   Wt 182 lb (82.6 kg)   SpO2 97%   BMI 32.24 kg/m     Wt Readings from Last 3 Encounters:  05/31/22 182 lb (82.6 kg)  05/26/22 184 lb (83.5 kg)  12/15/21 185 lb 3.2 oz (84 kg)     GEN:  Well nourished, well developed in no acute distress HEENT: Normal NECK: No JVD; No carotid bruits CARDIAC: RRR, no murmurs, rubs,  gallops RESPIRATORY:  Clear to auscultation without rales, wheezing or rhonchi  ABDOMEN: Soft, non-tender, non-distended MUSCULOSKELETAL:  No edema; No deformity. 2+ pedal pulses, equal bilaterally SKIN: Warm and dry NEUROLOGIC:  Alert and oriented x 3 PSYCHIATRIC:  Normal affect   EKG:  EKG is ordered today.  The ekg ordered today demonstrates normal sinus rhythm at 82 bpm, nonspecific TW abnormality    Diagnoses:    1. Mixed hyperlipidemia   2. Shortness of breath   3. Hyperlipidemia LDL goal <100    Assessment and Plan:     Shortness of breath: History of shortness of breath with minimal exertion such as walking more than a short distance and riding stationary bike. No orthopnea, edema, or PND. We will get echocardiogram to evaluate for structural heart disease.  Chest pain: Symptoms occurred following ingestion of dairy products which caused her a lot of discomfort. Feels that symptoms were exacerbated by GERD. Compliant with PPI. Reassurance of prior normal cardiac tests. Will get echo as stated above.   Hyperlipidemia: LDL 113 on 07/09/21. Discussed the importance of LDL < 100 for overall CV risk reduction. We will recheck today. Likely will d/c pravastatin and recommend higher intensity statin if LDL remains elevated.  Encouraged healthy lifestyle 150 minutes of moderate  intensity exercise each week, mostly plant-based diet.     Disposition: 6 months with Dr. Johney Frame  Medication Adjustments/Labs and Tests Ordered: Current medicines are reviewed at length with the patient today.  Concerns regarding medicines are outlined above.  Orders Placed This Encounter  Procedures   Lipid panel   EKG 12-Lead   ECHOCARDIOGRAM COMPLETE   No orders of the defined types were placed in this encounter.   Patient Instructions  Medication Instructions:  Your physician recommends that you continue on your current medications as directed. Please refer to the Current Medication list given to you today. *If you need a refill on your cardiac medications before your next appointment, please call your pharmacy*   Lab Work: TODAY-LIPIDS If you have labs (blood work) drawn today and your tests are completely normal, you will receive your results only by: [place] (if you have MyChart) OR A paper copy in the mail If you have any lab test that is abnormal or we need to change your treatment, we will call you to review the results.   Testing/Procedures: Your physician has requested that you have an echocardiogram. Echocardiography is a painless test that uses sound waves to create images of your heart. It provides your doctor with information about the size and shape of your heart and how well your heart's chambers and valves are working. This procedure takes approximately one hour. There are no restrictions for this procedure. Please do NOT wear cologne, perfume, aftershave, or lotions (deodorant is allowed). Please arrive 15 minutes prior to your appointment time.   Follow-Up: At [facility], you and your health needs are our priority.  As part of our continuing mission to provide you with exceptional heart care, we have created designated Provider Care Teams.  These Care Teams include your primary Cardiologist (physician) and Advanced Practice Providers  (APPs -  Physician Assistants and Nurse Practitioners) who all work together to provide you with the care you need, when you need it.  We recommend signing up for the patient portal called "MyChart".  Sign up information is provided on this After Visit Summary.  MyChart is used to connect with patients for Virtual Visits (Telemedicine).  Patients are able  to view lab/test results, encounter notes, upcoming appointments, etc.  Non-urgent messages can be sent to your provider as well.   To learn more about what you can do with MyChart, go to [email or web address].    Your next appointment:   6 month(s)  Provider:   Freada Bergeron, MD     Other Instructions Mediterranean Diet A Mediterranean diet refers to food and lifestyle choices that are based on the traditions of countries located on the [place]. It focuses on eating more fruits, vegetables, whole grains, beans, nuts, seeds, and heart-healthy fats, and eating less dairy, meat, eggs, and processed foods with added sugar, salt, and fat. This way of eating has been shown to help prevent certain conditions and improve outcomes for people who have chronic diseases, like kidney disease and heart disease. What are tips for following this plan? Reading food labels Check the serving size of packaged foods. For foods such as rice and pasta, the serving size refers to the amount of cooked product, not dry. Check the total fat in packaged foods. Avoid foods that have saturated fat or trans fats. Check the ingredient list for added sugars, such as corn syrup. Shopping  Buy a variety of foods that offer a balanced diet, including: Fresh fruits and vegetables (produce). Grains, beans, nuts, and seeds. Some of these may be available in unpackaged forms or large amounts (in bulk). Fresh seafood. Poultry and eggs. Low-fat dairy products. Buy whole ingredients instead of prepackaged foods. Buy fresh fruits and vegetables in-season from  local farmers markets. Buy plain frozen fruits and vegetables. If you do not have access to quality fresh seafood, buy precooked frozen shrimp or canned fish, such as tuna, salmon, or sardines. Stock your pantry so you always have certain foods on hand, such as olive oil, canned tuna, canned tomatoes, rice, pasta, and beans. Cooking Cook foods with extra-virgin olive oil instead of using butter or other vegetable oils. Have meat as a side dish, and have vegetables or grains as your main dish. This means having meat in small portions or adding small amounts of meat to foods like pasta or stew. Use beans or vegetables instead of meat in common dishes like chili or lasagna. Experiment with different cooking methods. Try roasting, broiling, steaming, and sauting vegetables. Add frozen vegetables to soups, stews, pasta, or rice. Add nuts or seeds for added healthy fats and plant protein at each meal. You can add these to yogurt, salads, or vegetable dishes. Marinate fish or vegetables using olive oil, lemon juice, garlic, and fresh herbs. Meal planning Plan to eat one vegetarian meal one day each week. Try to work up to two vegetarian meals, if possible. Eat seafood two or more times a week. Have healthy snacks readily available, such as: Vegetable sticks with hummus. Greek yogurt. Fruit and nut trail mix. Eat balanced meals throughout the week. This includes: Fruit: 2-3 servings a day. Vegetables: 4-5 servings a day. Low-fat dairy: 2 servings a day. Fish, poultry, or lean meat: 1 serving a day. Beans and legumes: 2 or more servings a week. Nuts and seeds: 1-2 servings a day. Whole grains: 6-8 servings a day. Extra-virgin olive oil: 3-4 servings a day. Limit red meat and sweets to only a few servings a month. Lifestyle  Cook and eat meals together with your family, when possible. Drink enough fluid to keep your urine pale yellow. Be physically active every day. This includes: Aerobic  exercise like running or swimming. Leisure activities like gardening,  walking, or housework. Get 7-8 hours of sleep each night. If recommended by your health care provider, drink red wine in moderation. This means 1 glass a day for nonpregnant women and 2 glasses a day for men. A glass of wine equals 5 oz (150 mL). What foods should I eat? Fruits Apples. Apricots. Avocado. Berries. Bananas. Cherries. Dates. Figs. Grapes. Lemons. Melon. Oranges. Peaches. Plums. Pomegranate. Vegetables Artichokes. Beets. Broccoli. Cabbage. Carrots. Eggplant. Green beans. Chard. Kale. Spinach. Onions. Leeks. Peas. Squash. Tomatoes. Peppers. Radishes. Grains Whole-grain pasta. Brown rice. Bulgur wheat. Polenta. Couscous. Whole-wheat bread. [place]. Meats and other proteins Beans. Almonds. Sunflower seeds. Pine nuts. Peanuts. [place]. Salmon. Scallops. Shrimp. [place]. Tilapia. Clams. Oysters. Eggs. Poultry without skin. Dairy Low-fat milk. Cheese. Greek yogurt. Fats and oils Extra-virgin olive oil. Avocado oil. Grapeseed oil. Beverages Water. Red wine. Herbal tea. Sweets and desserts Greek yogurt with honey. Baked apples. Poached pears. Trail mix. Seasonings and condiments Basil. Cilantro. Coriander. Cumin. Mint. Parsley. Sage. Rosemary. Tarragon. Garlic. Oregano. Thyme. Pepper. Balsamic vinegar. Tahini. Hummus. Tomato sauce. Olives. Mushrooms. The items listed above may not be a complete list of foods and beverages you can eat. Contact a dietitian for more information. What foods should I limit? This is a list of foods that should be eaten rarely or only on special occasions. Fruits Fruit canned in syrup. Vegetables Deep-fried potatoes (french fries). Grains Prepackaged pasta or rice dishes. Prepackaged cereal with added sugar. Prepackaged snacks with added sugar. Meats and other proteins Beef. Pork. Lamb. Poultry with skin. Hot dogs. Bernie[place] Salines. Dairy Ice cream. Sour cream. Whole milk. Fats and  oils Butter. Canola oil. Vegetable oil. Beef fat (tallow). Lard. Beverages Juice. Sugar-sweetened soft drinks. Beer. Liquor and spirits. Sweets and desserts Cookies. Cakes. Pies. Candy. Seasonings and condiments Mayonnaise. Pre-made sauces and marinades. The items listed above may not be a complete list of foods and beverages you should limit. Contact a dietitian for more information. Summary The Mediterranean diet includes both food and lifestyle choices. Eat a variety of fresh fruits and vegetables, beans, nuts, seeds, and whole grains. Limit the amount of red meat and sweets that you eat. If recommended by your health care provider, drink red wine in moderation. This means 1 glass a day for nonpregnant women and 2 glasses a day for men. A glass of wine equals 5 oz (150 mL). This information is not intended to replace advice given to you by your health care provider. Make sure you discuss any questions you have with your health care provider. Document Revised: 05/11/2019 Document Reviewed: 03/08/2019 Elsevier Patient Education  [place].    Adopting a Healthy Lifestyle.   Weight: Know what a healthy weight is for you (roughly BMI <25) and aim to maintain this. You can calculate your body mass index on your smart phone  Diet: Aim for 7+ servings of fruits and vegetables daily Limit animal fats in diet for cholesterol and heart health - choose grass fed whenever available Avoid highly processed foods (fast food burgers, tacos, fried chicken, pizza, hot dogs, french fries)  Saturated fat comes in the form of butter, lard, coconut oil, margarine, partially hydrogenated oils, and fat in meat. These increase your risk of cardiovascular disease.  Use healthy plant oils, such as olive, canola, soy, corn, sunflower and peanut.  Whole foods such as fruits, vegetables and whole grains have fiber  Men need > 38 grams of fiber per day Women need > 25 grams of fiber per day  Load up  on  vegetables and fruits - one-half of your plate: Aim for color and variety, and remember that potatoes dont count. Go for whole grains - one-quarter of your plate: Whole wheat, barley, wheat berries, quinoa, oats, brown rice, and foods made with them. If you want pasta, go with whole wheat pasta. Protein power - one-quarter of your plate: Fish, chicken, beans, and nuts are all healthy, versatile protein sources. Limit red meat. You need carbohydrates for energy! The type of carbohydrate is more important than the amount. Choose carbohydrates such as vegetables, fruits, whole grains, beans, and nuts in the place of white rice, white pasta, potatoes (baked or fried), macaroni and cheese, cakes, cookies, and donuts.  If youre thirsty, drink water. Coffee and tea are good in moderation, but skip sugary drinks and limit milk and dairy products to one or two daily servings. Keep sugar intake at 6 teaspoons or 24 grams or LESS       Exercise: Aim for 150 min of moderate intensity exercise weekly for heart health, and weights twice weekly for bone health Stay active - any steps are better than no steps! Aim for 7-9 hours of sleep daily         Signed, Emmaline Life, NP  06/01/2022 6:22 AM    [place]

## 2022-05-31 ENCOUNTER — Ambulatory Visit: Payer: Medicaid Other | Attending: Nurse Practitioner | Admitting: Nurse Practitioner

## 2022-05-31 ENCOUNTER — Encounter: Payer: Self-pay | Admitting: Nurse Practitioner

## 2022-05-31 VITALS — BP 116/84 | HR 82 | Ht 63.0 in | Wt 182.0 lb

## 2022-05-31 DIAGNOSIS — E785 Hyperlipidemia, unspecified: Secondary | ICD-10-CM | POA: Diagnosis not present

## 2022-05-31 DIAGNOSIS — R0602 Shortness of breath: Secondary | ICD-10-CM

## 2022-05-31 DIAGNOSIS — E782 Mixed hyperlipidemia: Secondary | ICD-10-CM

## 2022-05-31 NOTE — Patient Instructions (Addendum)
Medication Instructions:  Your physician recommends that you continue on your current medications as directed. Please refer to the Current Medication list given to you today. *If you need a refill on your cardiac medications before your next appointment, please call your pharmacy*   Lab Work: TODAY-LIPIDS If you have labs (blood work) drawn today and your tests are completely normal, you will receive your results only by: [place] (if you have MyChart) OR A paper copy in the mail If you have any lab test that is abnormal or we need to change your treatment, we will call you to review the results.   Testing/Procedures: Your physician has requested that you have an echocardiogram. Echocardiography is a painless test that uses sound waves to create images of your heart. It provides your doctor with information about the size and shape of your heart and how well your heart's chambers and valves are working. This procedure takes approximately one hour. There are no restrictions for this procedure. Please do NOT wear cologne, perfume, aftershave, or lotions (deodorant is allowed). Please arrive 15 minutes prior to your appointment time.   Follow-Up: At [facility], you and your health needs are our priority.  As part of our continuing mission to provide you with exceptional heart care, we have created designated Provider Care Teams.  These Care Teams include your primary Cardiologist (physician) and Advanced Practice Providers (APPs -  Physician Assistants and Nurse Practitioners) who all work together to provide you with the care you need, when you need it.  We recommend signing up for the patient portal called "MyChart".  Sign up information is provided on this After Visit Summary.  MyChart is used to connect with patients for Virtual Visits (Telemedicine).  Patients are able to view lab/test results, encounter notes, upcoming appointments, etc.  Non-urgent messages can be sent to  your provider as well.   To learn more about what you can do with MyChart, go to [email or web address].    Your next appointment:   6 month(s)  Provider:   Freada Bergeron, MD     Other Instructions Mediterranean Diet A Mediterranean diet refers to food and lifestyle choices that are based on the traditions of countries located on the [place]. It focuses on eating more fruits, vegetables, whole grains, beans, nuts, seeds, and heart-healthy fats, and eating less dairy, meat, eggs, and processed foods with added sugar, salt, and fat. This way of eating has been shown to help prevent certain conditions and improve outcomes for people who have chronic diseases, like kidney disease and heart disease. What are tips for following this plan? Reading food labels Check the serving size of packaged foods. For foods such as rice and pasta, the serving size refers to the amount of cooked product, not dry. Check the total fat in packaged foods. Avoid foods that have saturated fat or trans fats. Check the ingredient list for added sugars, such as corn syrup. Shopping  Buy a variety of foods that offer a balanced diet, including: Fresh fruits and vegetables (produce). Grains, beans, nuts, and seeds. Some of these may be available in unpackaged forms or large amounts (in bulk). Fresh seafood. Poultry and eggs. Low-fat dairy products. Buy whole ingredients instead of prepackaged foods. Buy fresh fruits and vegetables in-season from local farmers markets. Buy plain frozen fruits and vegetables. If you do not have access to quality fresh seafood, buy precooked frozen shrimp or canned fish, such as tuna, salmon, or sardines. Stock Conservator, museum/gallery  so you always have certain foods on hand, such as olive oil, canned tuna, canned tomatoes, rice, pasta, and beans. Cooking Cook foods with extra-virgin olive oil instead of using butter or other vegetable oils. Have meat as a side dish, and have  vegetables or grains as your main dish. This means having meat in small portions or adding small amounts of meat to foods like pasta or stew. Use beans or vegetables instead of meat in common dishes like chili or lasagna. Experiment with different cooking methods. Try roasting, broiling, steaming, and sauting vegetables. Add frozen vegetables to soups, stews, pasta, or rice. Add nuts or seeds for added healthy fats and plant protein at each meal. You can add these to yogurt, salads, or vegetable dishes. Marinate fish or vegetables using olive oil, lemon juice, garlic, and fresh herbs. Meal planning Plan to eat one vegetarian meal one day each week. Try to work up to two vegetarian meals, if possible. Eat seafood two or more times a week. Have healthy snacks readily available, such as: Vegetable sticks with hummus. Greek yogurt. Fruit and nut trail mix. Eat balanced meals throughout the week. This includes: Fruit: 2-3 servings a day. Vegetables: 4-5 servings a day. Low-fat dairy: 2 servings a day. Fish, poultry, or lean meat: 1 serving a day. Beans and legumes: 2 or more servings a week. Nuts and seeds: 1-2 servings a day. Whole grains: 6-8 servings a day. Extra-virgin olive oil: 3-4 servings a day. Limit red meat and sweets to only a few servings a month. Lifestyle  Cook and eat meals together with your family, when possible. Drink enough fluid to keep your urine pale yellow. Be physically active every day. This includes: Aerobic exercise like running or swimming. Leisure activities like gardening, walking, or housework. Get 7-8 hours of sleep each night. If recommended by your health care provider, drink red wine in moderation. This means 1 glass a day for nonpregnant women and 2 glasses a day for men. A glass of wine equals 5 oz (150 mL). What foods should I eat? Fruits Apples. Apricots. Avocado. Berries. Bananas. Cherries. Dates. Figs. Grapes. Lemons. Melon. Oranges. Peaches.  Plums. Pomegranate. Vegetables Artichokes. Beets. Broccoli. Cabbage. Carrots. Eggplant. Green beans. Chard. Kale. Spinach. Onions. Leeks. Peas. Squash. Tomatoes. Peppers. Radishes. Grains Whole-grain pasta. Brown rice. Bulgur wheat. Polenta. Couscous. Whole-wheat bread. [place]. Meats and other proteins Beans. Almonds. Sunflower seeds. Pine nuts. Peanuts. [place]. Salmon. Scallops. Shrimp. [place]. Tilapia. Clams. Oysters. Eggs. Poultry without skin. Dairy Low-fat milk. Cheese. Greek yogurt. Fats and oils Extra-virgin olive oil. Avocado oil. Grapeseed oil. Beverages Water. Red wine. Herbal tea. Sweets and desserts Greek yogurt with honey. Baked apples. Poached pears. Trail mix. Seasonings and condiments Basil. Cilantro. Coriander. Cumin. Mint. Parsley. Sage. Rosemary. Tarragon. Garlic. Oregano. Thyme. Pepper. Balsamic vinegar. Tahini. Hummus. Tomato sauce. Olives. Mushrooms. The items listed above may not be a complete list of foods and beverages you can eat. Contact a dietitian for more information. What foods should I limit? This is a list of foods that should be eaten rarely or only on special occasions. Fruits Fruit canned in syrup. Vegetables Deep-fried potatoes (french fries). Grains Prepackaged pasta or rice dishes. Prepackaged cereal with added sugar. Prepackaged snacks with added sugar. Meats and other proteins Beef. Pork. Lamb. Poultry with skin. Hot dogs. Berniece Salines. Dairy Ice cream. Sour cream. Whole milk. Fats and oils Butter. Canola oil. Vegetable oil. Beef fat (tallow). Lard. Beverages Juice. Sugar-sweetened soft drinks. Beer. Liquor and spirits. Sweets and desserts Cookies.  Cakes. Pies. Candy. Seasonings and condiments Mayonnaise. Pre-made sauces and marinades. The items listed above may not be a complete list of foods and beverages you should limit. Contact a dietitian for more information. Summary The Mediterranean diet includes both food and lifestyle  choices. Eat a variety of fresh fruits and vegetables, beans, nuts, seeds, and whole grains. Limit the amount of red meat and sweets that you eat. If recommended by your health care provider, drink red wine in moderation. This means 1 glass a day for nonpregnant women and 2 glasses a day for men. A glass of wine equals 5 oz (150 mL). This information is not intended to replace advice given to you by your health care provider. Make sure you discuss any questions you have with your health care provider. Document Revised: 05/11/2019 Document Reviewed: 03/08/2019 Elsevier Patient Education  [place].    Adopting a Healthy Lifestyle.   Weight: Know what a healthy weight is for you (roughly BMI <25) and aim to maintain this. You can calculate your body mass index on your smart phone  Diet: Aim for 7+ servings of fruits and vegetables daily Limit animal fats in diet for cholesterol and heart health - choose grass fed whenever available Avoid highly processed foods (fast food burgers, tacos, fried chicken, pizza, hot dogs, french fries)  Saturated fat comes in the form of butter, lard, coconut oil, margarine, partially hydrogenated oils, and fat in meat. These increase your risk of cardiovascular disease.  Use healthy plant oils, such as olive, canola, soy, corn, sunflower and peanut.  Whole foods such as fruits, vegetables and whole grains have fiber  Men need > 38 grams of fiber per day Women need > 25 grams of fiber per day  Load up on vegetables and fruits - one-half of your plate: Aim for color and variety, and remember that potatoes dont count. Go for whole grains - one-quarter of your plate: Whole wheat, barley, wheat berries, quinoa, oats, brown rice, and foods made with them. If you want pasta, go with whole wheat pasta. Protein power - one-quarter of your plate: Fish, chicken, beans, and nuts are all healthy, versatile protein sources. Limit red meat. You need carbohydrates for  energy! The type of carbohydrate is more important than the amount. Choose carbohydrates such as vegetables, fruits, whole grains, beans, and nuts in the place of white rice, white pasta, potatoes (baked or fried), macaroni and cheese, cakes, cookies, and donuts.  If youre thirsty, drink water. Coffee and tea are good in moderation, but skip sugary drinks and limit milk and dairy products to one or two daily servings. Keep sugar intake at 6 teaspoons or 24 grams or LESS       Exercise: Aim for 150 min of moderate intensity exercise weekly for heart health, and weights twice weekly for bone health Stay active - any steps are better than no steps! Aim for 7-9 hours of sleep daily

## 2022-06-01 ENCOUNTER — Encounter: Payer: Self-pay | Admitting: Nurse Practitioner

## 2022-06-01 ENCOUNTER — Telehealth: Payer: Self-pay

## 2022-06-01 DIAGNOSIS — E785 Hyperlipidemia, unspecified: Secondary | ICD-10-CM

## 2022-06-01 LAB — LIPID PANEL
Chol/HDL Ratio: 5.1 ratio — ABNORMAL HIGH (ref 0.0–4.4)
Cholesterol, Total: 189 mg/dL (ref 100–199)
HDL: 37 mg/dL — ABNORMAL LOW (ref >39)
LDL Chol Calc (NIH): 122 mg/dL — ABNORMAL HIGH (ref 0–99)
Triglycerides: 169 mg/dL — ABNORMAL HIGH (ref 0–149)
VLDL Cholesterol Cal: 30 mg/dL (ref 5–40)

## 2022-06-01 MED ORDER — ROSUVASTATIN CALCIUM 10 MG PO TABS: 10.0000 mg | ORAL_TABLET | Freq: Every day | ORAL | 2 refills | Status: AC

## 2022-06-01 NOTE — Telephone Encounter (Signed)
-----   Message from Emmaline Life, NP sent at 06/01/2022  5:14 AM EST ----- Triglycerides are elevated. LDL is high and HDL is low which is not the ratio that we want. Would recommend that you stop pravastatin and start rosuvastatin 10 mg daily.  Try to get 150 minutes of moderate intensity exercise each week and incorporate more plant-based eating.  Avoid high sodium, high fat, processed and sugary foods. Will need repeat lipid panel/ALT in 2-3 months.

## 2022-06-29 ENCOUNTER — Other Ambulatory Visit (HOSPITAL_COMMUNITY): Payer: Medicaid Other

## 2022-07-19 ENCOUNTER — Other Ambulatory Visit: Payer: Self-pay

## 2022-07-19 DIAGNOSIS — E785 Hyperlipidemia, unspecified: Secondary | ICD-10-CM

## 2022-07-22 ENCOUNTER — Ambulatory Visit: Payer: Medicaid Other | Attending: Nurse Practitioner

## 2022-07-22 DIAGNOSIS — E785 Hyperlipidemia, unspecified: Secondary | ICD-10-CM

## 2022-07-23 ENCOUNTER — Ambulatory Visit (HOSPITAL_COMMUNITY): Payer: Medicaid Other | Attending: Cardiology

## 2022-07-23 DIAGNOSIS — R0602 Shortness of breath: Secondary | ICD-10-CM | POA: Diagnosis present

## 2022-07-23 LAB — LIPID PANEL
Chol/HDL Ratio: 3.3 ratio (ref 0.0–4.4)
Cholesterol, Total: 148 mg/dL (ref 100–199)
HDL: 45 mg/dL
LDL Chol Calc (NIH): 81 mg/dL (ref 0–99)
Triglycerides: 123 mg/dL (ref 0–149)
VLDL Cholesterol Cal: 22 mg/dL (ref 5–40)

## 2022-07-23 LAB — ALT: ALT: 15 IU/L (ref 0–32)

## 2022-07-24 LAB — ECHOCARDIOGRAM COMPLETE
Area-P 1/2: 3.72 cm2
S' Lateral: 2.5 cm

## 2022-12-06 ENCOUNTER — Ambulatory Visit: Payer: Medicaid Other | Admitting: Cardiology

## 2023-02-08 ENCOUNTER — Encounter: Payer: Self-pay | Admitting: Cardiovascular Disease

## 2023-02-08 ENCOUNTER — Ambulatory Visit: Payer: Medicaid Other | Attending: Cardiology | Admitting: Cardiovascular Disease

## 2023-02-08 VITALS — BP 120/78 | HR 87 | Ht 63.0 in | Wt 184.4 lb

## 2023-02-08 DIAGNOSIS — I10 Essential (primary) hypertension: Secondary | ICD-10-CM | POA: Insufficient documentation

## 2023-02-08 NOTE — Patient Instructions (Signed)
Follow-Up: At [facility], you and your health needs are our priority.  As part of our continuing mission to provide you with exceptional heart care, we have created designated Provider Care Teams.  These Care Teams include your primary Cardiologist (physician) and Advanced Practice Providers (APPs -  Physician Assistants and Nurse Practitioners) who all work together to provide you with the care you need, when you need it.  We recommend signing up for the patient portal called "MyChart".  Sign up information is provided on this After Visit Summary.  MyChart is used to connect with patients for Virtual Visits (Telemedicine).  Patients are able to view lab/test results, encounter notes, upcoming appointments, etc.  Non-urgent messages can be sent to your provider as well.   To learn more about what you can do with MyChart, go to [email or web address].    Your next appointment:   1 year(s)  Provider:   Jodelle Red, MD

## 2023-02-08 NOTE — Progress Notes (Signed)
  Cardiology Office Note:  .   Date:  02/08/2023  ID:  Kristian Covey, DOB 11-Oct-1965, MRN [id number] PCP: Vonzella Nipple, FNP   HeartCare Providers Cardiologist:  Meriam Sprague, MD (Inactive) { Former Shari Prows, now Suzana Sohail     History of Present Illness: Kathleen Foley is a 57 y.o. female  with HTN, OSA , HLD   Complained of some DOE last year  - sees pulmonary   Atypical CP  CAC score is 0  She was cleared for her cochlear implant - she does not want to get the implant      ROS:   Studies Reviewed: .         Risk Assessment/Calculations:             Physical Exam:   VS:  BP 120/78   Pulse 87   Ht 5\' 3"  (1.6 m)   Wt 184 lb 6.4 oz (83.6 kg)   SpO2 99%   BMI 32.66 kg/m    Wt Readings from Last 3 Encounters:  02/08/23 184 lb 6.4 oz (83.6 kg)  05/31/22 182 lb (82.6 kg)  05/26/22 184 lb (83.5 kg)    GEN: Well nourished, well developed in no acute distress NECK: No JVD; No carotid bruits CARDIAC: RRR, no murmurs, rubs, gallops RESPIRATORY:  Clear to auscultation without rales, wheezing or rhonchi  ABDOMEN: Soft, non-tender, non-distended EXTREMITIES:  No edema; No deformity  Neuro:  very hard of hearing ( reads lips well )   ASSESSMENT AND PLAN: .    HTN:  BP is well controlled on amlodipine .   She wants to continue to follow with cardiology   Will have her see Dr. Cristal Deer in 1 year        Dispo: 1 year with Dr. Jodelle Red      Signed, Kristeen Miss, MD

## 2023-08-29 NOTE — Progress Notes (Signed)
 Chief Complaint: Chief Complaint  Patient presents with  . Right Foot - Pain, Consult    HPI: Kathleen Foley is a 58 y.o. female who presents for evaluation of plantar right fourth metatarsal head pain.  Pain is been ongoing for past year or more has been worsening with ambulation particularly barefoot.  Patient denies any acute trauma or injury.  Patient denies nausea, vomiting, fever, chills, chest pain, or shortness of breath.  Past Medical History:  Diagnosis Date  . Anemia   . Asthma (CMD)   . Depression   . GERD (gastroesophageal reflux disease)   . Hypertension   . Sleep apnea     Past Surgical History:  Procedure Laterality Date  . CESAREAN SECTION    . CHOLECYSTECTOMY    . HYSTERECTOMY     . TUBAL LIGATION      Current Outpatient Medications  Medication Sig Dispense Refill  . albuterol HFA (PROVENTIL HFA;VENTOLIN HFA;PROAIR HFA) 90 mcg/actuation inhaler Inhale 2 puffs.    SABRA amLODIPine (NORVASC) 2.5 mg tablet TAKE ONE TABLET IN THE MORNING    . aspirin 81 mg EC tablet Take 1 tablet by mouth.    . budesonide-formoteroL (SYMBICORT) 160-4.5 mcg/actuation inhaler Inhale 2 puffs.    . cetirizine (ZyrTEC) 5 mg tablet Take 5 mg by mouth.    . cholecalciferol (VITAMIN D3) 2,000 unit tablet Administer 1 tablet into affected nostril(s).    SABRA dexlansoprazole (Dexilant) 60 mg DR capsule TAKE ONE CAPSULE IN THE MORNING    . escitalopram (LEXAPRO) 10 mg tablet Take by mouth.    . finasteride (PROSCAR) 5 mg tablet TAKE 1/2 TABLET IN THE EVENING    . fluocinolone 0.01 % oil Apply to the affected areas of the scalp approximately three to five times weekly. Do not apply to the face. 118.28 mL 5  . furosemide (LASIX) 20 mg tablet Take 20 mg by mouth per protocol (see comments).    . gabapentin (NEURONTIN) 300 mg capsule     . meloxicam (MOBIC) 15 mg tablet     . methocarbamoL (ROBAXIN) 500 mg tablet Take 500 mg by mouth nightly as needed.    . METOPROLOL  SUCCINATE ORAL Take 1 tablet  by mouth daily.    . minoxidiL (LONITEN) 2.5 mg tablet Take 0.5 tablets (1.25 mg total) by mouth daily. 45 tablet 3  . montelukast (SINGULAIR) 10 mg tablet TAKE ONE TABLET IN THE EVENING    . oxyBUTYnin (DITROPAN XL) 10 mg 24 hr tablet Take 10 mg by mouth.    . pravastatin (PRAVACHOL) 20 mg tablet     . prednisoLONE acetate (PRED FORTE) 1 % ophthalmic suspension     . prenat.vits,cal,min-iron-folic tab Take by mouth.    . Prenatal Vitamin 27 mg iron- 0.8 mg tablet TAKE ONE CAPSULE IN THE EVENING    . rosuvastatin  (CRESTOR ) 10 mg tablet Take 10 mg by mouth.    SABRA azelastine (ASTELIN) 137 mcg (0.1 %) nasal spray Administer 1 spray into affected nostril(s). (Patient not taking: Reported on 06/10/2023)    . traZODone (DESYREL) 50 mg tablet Take 1-2 tabs by mouth nightly prn sleep (Patient not taking: Reported on 06/10/2023)     No current facility-administered medications for this visit.    Allergies  Allergen Reactions  . Latex Rash    Other reaction(s): hives  . Pollen Extracts   . Grass Pollen-Red Top, Standard Other (See Comments)    sneezing  . Hydrocodone Rash    Pt. Thinks hydrocodone  is the offending drug.     Family History  Problem Relation Name Age of Onset  . Ovarian cancer Mother    . Breast cancer Sister    . Thyroid disease Sister    . Ovarian cancer Sister    . Ovarian cancer Sister      Social History   Tobacco Use  . Smoking status: Never  . Smokeless tobacco: Never  Vaping Use  . Vaping status: Never Used  Substance Use Topics  . Alcohol use: Never  . Drug use: Never    Review of Systems: A complete ROS was performed with pertinent positives/negatives noted in the HPI. The remainder of the ROS are negative.  Physical Examination:  Constitutional:  Well nourished in no acute distress. Vitals:   08/29/23 0854  BP: 115/71  BP Location: Right arm  Patient Position: Sitting  Pulse: 72    Psychiatric: Orientated to time, place, person; Appropriate  mood and affect today.  Bilateral Lower Extremity Exam:  Vascular: DP/PT pulses 2/4 bilateral.  CFT <3 seconds.  Normal hair growth on digits.  No edema.   Skin: No cuts lacerations or abrasions bilateral feet.     Musculoskeletal:  MMT 5/5 bilateral lower extremities in DF, PF, Inversion, Eversion.  Decreased ROM in DF at ankle joint bilaterally.  Prominent metatarsal heads plantarly with mild tenderness tenderness over the fourth metatarsal head.    Neurologic:  Sensation intact to light touch.  Imaging: An independent review of images were completed by myself and radiology interpretation was not available at time of visit.  Per my interpretation. 3 views of the right foot reviewed.  No acute fracture or dislocation identified.  Moderate joint space narrowing.  Assessment: Mikia H Yerian is a 58 y.o. female who presents with  1. Fat pad atrophy of foot      2. Pain in left foot  CANCELED: XR Foot Minimum 3 Views Left    3. Right foot pain  XR Foot Minimum 3 Views Right    4. Metatarsalgia of right foot        Plan: Nathanel DEL Lippe was evaluated, treated, and all questions were answered by myself. Radiographs and pertinent radiological exams were reviewed. Patient educated on clinical exam findings, diagnosis, and conservative vs surgical treatment options.  Discussed with patient fat pad atrophy and anterior displacement of fat pad.   Discussed stretching exercises for Achilles tendon to reduce forefoot pressures.   Patient was shown several different padding options to increase cushioning in the forefoot. Discussed not going barefoot and need for supportive shoe gear.  Patient educated on metatarsalgia and equinus. Educated patient on stretching, inserts, metatarsal pads, anti-inflammatories, and ice therapy. Educated on possible physical therapy. Patient provided handout on stretching exercises and recommended OTC inserts. Educated on custom inserts.   Patient to  follow up as needed for re-evaluation.  Patient can call with any questions or should problems arise.  Patients in agreement with plan above.    Future Appt.:     Future Appointments  Date Time Provider Department Center  09/19/2023 11:00 AM [facility] MG[facility] RAD [facility] [place]  06/13/2024  1:15 PM [facility] DERMATOLOGY CC RESIDENT II [facility] DRM CC [facility] CC [facility]       Date: 08/29/2023  Time: 11:25 AM

## 2024-01-13 ENCOUNTER — Other Ambulatory Visit: Payer: Self-pay

## 2024-01-13 DIAGNOSIS — Z79899 Other long term (current) drug therapy: Secondary | ICD-10-CM | POA: Insufficient documentation

## 2024-01-13 DIAGNOSIS — S61213A Laceration without foreign body of left middle finger without damage to nail, initial encounter: Secondary | ICD-10-CM | POA: Diagnosis not present

## 2024-01-13 DIAGNOSIS — W260XXA Contact with knife, initial encounter: Secondary | ICD-10-CM | POA: Diagnosis not present

## 2024-01-13 DIAGNOSIS — Z91040 Latex allergy status: Secondary | ICD-10-CM | POA: Insufficient documentation

## 2024-01-13 DIAGNOSIS — I10 Essential (primary) hypertension: Secondary | ICD-10-CM | POA: Diagnosis not present

## 2024-01-13 DIAGNOSIS — S6992XA Unspecified injury of left wrist, hand and finger(s), initial encounter: Secondary | ICD-10-CM | POA: Diagnosis present
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
## 2024-01-14 ENCOUNTER — Emergency Department (HOSPITAL_BASED_OUTPATIENT_CLINIC_OR_DEPARTMENT_OTHER)
Admission: EM | Admit: 2024-01-14 | Discharge: 2024-01-14 | Disposition: A | Discharge status: Home / Self Care | Attending: Emergency Medicine | Admitting: Emergency Medicine

## 2024-01-14 DIAGNOSIS — S61213A Laceration without foreign body of left middle finger without damage to nail, initial encounter: Secondary | ICD-10-CM

## 2024-01-14 NOTE — ED Provider Notes (Signed)
 [facility] Provider Note   CSN: [id number] Arrival date & time: 01/13/24  2353     Patient presents with: Laceration   Kathleen Foley is a 58 y.o. female.   The history is provided by the patient.  Laceration Kathleen Foley is a 58 y.o. female who presents to the Emergency Department complaining of laceration. She presents the emergency department for evaluation of laceration to the left third digit that occurred about two hours prior to arrival. She states that she was cutting a line off of fish with a serrated knife when her hand slipped and cut her left hand. Tetanus is up-to-date. She has a history of hypertension, no additional medical problems. She is right-hand dominant.     Prior to Admission medications   Medication Sig Start Date End Date Taking? Authorizing Provider  albuterol (PROVENTIL) (2.5 MG/3ML) 0.083% nebulizer solution Inhale into the lungs. 05/03/19   [provider]  albuterol (VENTOLIN HFA) 108 (90 Base) MCG/ACT inhaler Inhale 2 puffs into the lungs every 4 (four) hours as needed for shortness of breath or wheezing. 10/30/21   [provider]  amLODipine (NORVASC) 2.5 MG tablet TAKE ONE TABLET IN THE MORNING 11/12/20   [provider]  cetirizine (ZYRTEC) 10 MG tablet TAKE ONE TABLET IN THE MORNING 11/12/20   [provider]  dexlansoprazole (DEXILANT) 60 MG capsule Take 1 capsule by mouth every morning. 11/12/20   [provider]  escitalopram (LEXAPRO) 20 MG tablet TAKE ONE TABLET IN THE MORNING 09/16/20   [provider]  Ferrous Sulfate Dried (FERROUS SULFATE IRON PO)  06/04/22   [provider]  gabapentin (NEURONTIN) 300 MG capsule SMARTSIG:1 Capsule(s) By Mouth Every Evening 11/12/20   [provider]  GOODSENSE ASPIRIN LOW DOSE 81 MG EC tablet TAKE ONE TABLET IN THE MORNING 10/13/20   [provider]  Prenatal Vit-Fe Fumarate-FA (PRENATAL  MULTIVITAMIN) TABS tablet Take 1 tablet by mouth daily at 12 noon.    [provider]  RESTASIS 0.05 % ophthalmic emulsion 1 drop 2 (two) times daily. 11/12/20   [provider]  rosuvastatin  (CRESTOR ) 10 MG tablet Take 1 tablet (10 mg total) by mouth daily. 06/01/22 08/30/22  Swinyer, Rosaline HERO, NP  SYMBICORT 160-4.5 MCG/ACT inhaler Inhale 2 puffs into the lungs 2 (two) times daily as needed.    [provider]    Allergies: Hydrocodone, Latex, and Grass extracts [gramineae pollens]    Review of Systems  All other systems reviewed and are negative.   Updated Vital Signs BP (!) 146/86   Pulse (!) 55   Temp 98 F (36.7 C)   Resp 16   Ht 5' 3 (1.6 m)   Wt 81.2 kg   SpO2 94%   BMI 31.71 kg/m   Physical Exam Vitals and nursing note reviewed.  Constitutional:      Appearance: Normal appearance.  HENT:     Head: Atraumatic.  Cardiovascular:     Rate and Rhythm: Normal rate and regular rhythm.  Pulmonary:     Effort: Pulmonary effort is normal. No respiratory distress.  Musculoskeletal:     Comments: 2+ left radial pulse. There is a well approximated 2 cm laceration over the left third digit that crosses the dorsal DIP joint. Flexion extension is intact in the digit against resistance. No significant tenderness over the joint.  Skin:    General: Skin is warm.  Neurological:     Mental Status: She  is alert.     (all labs ordered are listed, but only abnormal results are displayed) Labs Reviewed - No data to display  EKG: None  Radiology: No results found.   .Laceration Repair  Date/Time: 01/14/2024 3:30 AM  Performed by: Griselda Norris, MD Authorized by: Griselda Norris, MD   Consent:    Consent obtained:  Verbal   Consent given by:  Patient   Risks discussed:  Infection, pain and poor cosmetic result Universal protocol:    Patient identity confirmed:  Verbally with patient Laceration details:    Location:  Finger   Finger location:   L long finger   Length (cm):  2 Exploration:    Imaging obtained: bedside ultrasound     Imaging outcome: foreign body not noted   Treatment:    Area cleansed with:  Povidone-iodine and saline   Amount of cleaning:  Standard   Debridement:  None   Undermining:  None Skin repair:    Repair method:  Tissue adhesive Approximation:    Approximation:  Close Repair type:    Repair type:  Simple    Medications Ordered in the ED - No data to display                                  Medical Decision Making  Patient here for evaluation of lacerations of left hand. She has a well approximated wound that is hemostatic. Wound was cleansed with Betadine and saline. Dermabond was applied and she was provided a removable finger splint for comfort. Current picture is not consistent with return foreign body, tendon injury, open joint or fracture. Discussed wound care, outpatient follow-up and return precautions.     Final diagnoses:  Laceration of left middle finger without foreign body without damage to nail, initial encounter    ED Discharge Orders     None          Griselda Norris, MD 01/14/24 [phone]
